# Patient Record
Sex: Female | Born: 1945 | Race: White | Hispanic: No | Marital: Married | Smoking: Never smoker
Health system: Southern US, Community
[De-identification: ages and names within clinical notes are randomized; demographics above are authoritative.]

## PROBLEM LIST (undated history)

## (undated) DIAGNOSIS — K5792 Diverticulitis of intestine, part unspecified, without perforation or abscess without bleeding: Secondary | ICD-10-CM

## (undated) DIAGNOSIS — G4700 Insomnia, unspecified: Secondary | ICD-10-CM

## (undated) DIAGNOSIS — Z8619 Personal history of other infectious and parasitic diseases: Secondary | ICD-10-CM

## (undated) DIAGNOSIS — N3010 Interstitial cystitis (chronic) without hematuria: Secondary | ICD-10-CM

## (undated) DIAGNOSIS — Z860100 Personal history of colon polyps, unspecified: Secondary | ICD-10-CM

## (undated) DIAGNOSIS — M8580 Other specified disorders of bone density and structure, unspecified site: Secondary | ICD-10-CM

## (undated) DIAGNOSIS — T7840XA Allergy, unspecified, initial encounter: Secondary | ICD-10-CM

## (undated) DIAGNOSIS — K2270 Barrett's esophagus without dysplasia: Secondary | ICD-10-CM

## (undated) DIAGNOSIS — I341 Nonrheumatic mitral (valve) prolapse: Secondary | ICD-10-CM

## (undated) DIAGNOSIS — M1990 Unspecified osteoarthritis, unspecified site: Secondary | ICD-10-CM

## (undated) DIAGNOSIS — K219 Gastro-esophageal reflux disease without esophagitis: Secondary | ICD-10-CM

## (undated) DIAGNOSIS — J309 Allergic rhinitis, unspecified: Secondary | ICD-10-CM

## (undated) DIAGNOSIS — E785 Hyperlipidemia, unspecified: Secondary | ICD-10-CM

## (undated) DIAGNOSIS — J988 Other specified respiratory disorders: Secondary | ICD-10-CM

## (undated) DIAGNOSIS — J45909 Unspecified asthma, uncomplicated: Secondary | ICD-10-CM

## (undated) DIAGNOSIS — Z8669 Personal history of other diseases of the nervous system and sense organs: Secondary | ICD-10-CM

## (undated) DIAGNOSIS — I4892 Unspecified atrial flutter: Secondary | ICD-10-CM

## (undated) DIAGNOSIS — K529 Noninfective gastroenteritis and colitis, unspecified: Secondary | ICD-10-CM

## (undated) DIAGNOSIS — H269 Unspecified cataract: Secondary | ICD-10-CM

## (undated) DIAGNOSIS — J40 Bronchitis, not specified as acute or chronic: Secondary | ICD-10-CM

## (undated) DIAGNOSIS — M419 Scoliosis, unspecified: Secondary | ICD-10-CM

## (undated) DIAGNOSIS — G4730 Sleep apnea, unspecified: Secondary | ICD-10-CM

## (undated) DIAGNOSIS — W57XXXA Bitten or stung by nonvenomous insect and other nonvenomous arthropods, initial encounter: Secondary | ICD-10-CM

## (undated) DIAGNOSIS — F419 Anxiety disorder, unspecified: Secondary | ICD-10-CM

## (undated) DIAGNOSIS — I4891 Unspecified atrial fibrillation: Secondary | ICD-10-CM

## (undated) HISTORY — DX: Gastro-esophageal reflux disease without esophagitis: K21.9

## (undated) HISTORY — DX: Diverticulitis of intestine, part unspecified, without perforation or abscess without bleeding: K57.92

## (undated) HISTORY — DX: Allergy, unspecified, initial encounter: T78.40XA

## (undated) HISTORY — DX: Personal history of colonic polyps: Z86.010

## (undated) HISTORY — DX: Personal history of other diseases of the nervous system and sense organs: Z86.69

## (undated) HISTORY — DX: Scoliosis, unspecified: M41.9

## (undated) HISTORY — DX: Other specified disorders of bone density and structure, unspecified site: M85.80

## (undated) HISTORY — DX: Bitten or stung by nonvenomous insect and other nonvenomous arthropods, initial encounter: W57.XXXA

## (undated) HISTORY — DX: Unspecified atrial fibrillation: I48.91

## (undated) HISTORY — DX: Allergic rhinitis, unspecified: J30.9

## (undated) HISTORY — PX: COLONOSCOPY: SHX174

## (undated) HISTORY — DX: Insomnia, unspecified: G47.00

## (undated) HISTORY — DX: Unspecified asthma, uncomplicated: J45.909

## (undated) HISTORY — DX: Unspecified cataract: H26.9

## (undated) HISTORY — DX: Nonrheumatic mitral (valve) prolapse: I34.1

## (undated) HISTORY — DX: Sleep apnea, unspecified: G47.30

## (undated) HISTORY — PX: OTHER SURGICAL HISTORY: SHX169

## (undated) HISTORY — PX: APPENDECTOMY: SHX54

## (undated) HISTORY — DX: Interstitial cystitis (chronic) without hematuria: N30.10

## (undated) HISTORY — DX: Barrett's esophagus without dysplasia: K22.70

## (undated) HISTORY — DX: Hyperlipidemia, unspecified: E78.5

## (undated) HISTORY — DX: Personal history of colon polyps, unspecified: Z86.0100

## (undated) HISTORY — DX: Anxiety disorder, unspecified: F41.9

## (undated) HISTORY — DX: Unspecified osteoarthritis, unspecified site: M19.90

## (undated) HISTORY — PX: CHOLECYSTECTOMY: SHX55

## (undated) HISTORY — DX: Unspecified atrial flutter: I48.92

## (undated) HISTORY — PX: WISDOM TOOTH EXTRACTION: SHX21

## (undated) HISTORY — PX: BREAST BIOPSY: SHX20

## (undated) HISTORY — DX: Other specified respiratory disorders: J98.8

## (undated) HISTORY — DX: Bronchitis, not specified as acute or chronic: J40

## (undated) HISTORY — DX: Personal history of other infectious and parasitic diseases: Z86.19

## (undated) HISTORY — PX: TONSILLECTOMY: SUR1361

## (undated) HISTORY — PX: ADENOIDECTOMY: SUR15

## (undated) HISTORY — PX: DILATION AND CURETTAGE OF UTERUS: SHX78

## (undated) HISTORY — PX: ABDOMINAL HYSTERECTOMY: SHX81

## (undated) HISTORY — DX: Noninfective gastroenteritis and colitis, unspecified: K52.9

---

## 1989-08-29 DIAGNOSIS — N3010 Interstitial cystitis (chronic) without hematuria: Secondary | ICD-10-CM

## 1989-08-29 HISTORY — DX: Interstitial cystitis (chronic) without hematuria: N30.10

## 1998-02-27 ENCOUNTER — Encounter: Admission: RE | Admit: 1998-02-27 | Discharge: 1998-05-28 | Payer: Self-pay | Admitting: Family Medicine

## 1998-04-17 ENCOUNTER — Encounter: Admission: RE | Admit: 1998-04-17 | Discharge: 1998-04-17 | Payer: Self-pay | Admitting: Sports Medicine

## 1998-05-15 ENCOUNTER — Encounter: Admission: RE | Admit: 1998-05-15 | Discharge: 1998-05-15 | Payer: Self-pay | Admitting: Family Medicine

## 1998-07-23 ENCOUNTER — Other Ambulatory Visit: Admission: RE | Admit: 1998-07-23 | Discharge: 1998-07-23 | Payer: Self-pay | Admitting: *Deleted

## 1999-10-01 ENCOUNTER — Encounter: Admission: RE | Admit: 1999-10-01 | Discharge: 1999-10-01 | Payer: Self-pay | Admitting: Sports Medicine

## 1999-11-24 ENCOUNTER — Encounter: Admission: RE | Admit: 1999-11-24 | Discharge: 1999-11-24 | Payer: Self-pay | Admitting: Sports Medicine

## 1999-11-24 ENCOUNTER — Encounter: Payer: Self-pay | Admitting: Sports Medicine

## 2000-07-14 ENCOUNTER — Encounter: Admission: RE | Admit: 2000-07-14 | Discharge: 2000-07-14 | Payer: Self-pay | Admitting: Family Medicine

## 2000-07-28 ENCOUNTER — Encounter (INDEPENDENT_AMBULATORY_CARE_PROVIDER_SITE_OTHER): Payer: Self-pay

## 2000-07-28 ENCOUNTER — Other Ambulatory Visit: Admission: RE | Admit: 2000-07-28 | Discharge: 2000-07-28 | Payer: Self-pay | Admitting: Internal Medicine

## 2000-09-08 ENCOUNTER — Encounter: Admission: RE | Admit: 2000-09-08 | Discharge: 2000-09-08 | Payer: Self-pay | Admitting: Sports Medicine

## 2001-06-02 ENCOUNTER — Encounter: Admission: RE | Admit: 2001-06-02 | Discharge: 2001-06-02 | Payer: Self-pay | Admitting: Sports Medicine

## 2001-10-19 ENCOUNTER — Encounter: Admission: RE | Admit: 2001-10-19 | Discharge: 2001-10-19 | Payer: Self-pay | Admitting: Sports Medicine

## 2001-11-14 ENCOUNTER — Encounter: Admission: RE | Admit: 2001-11-14 | Discharge: 2001-11-14 | Payer: Self-pay | Admitting: Sports Medicine

## 2001-12-22 ENCOUNTER — Other Ambulatory Visit: Admission: RE | Admit: 2001-12-22 | Discharge: 2001-12-22 | Payer: Self-pay | Admitting: *Deleted

## 2003-06-30 DIAGNOSIS — K529 Noninfective gastroenteritis and colitis, unspecified: Secondary | ICD-10-CM

## 2003-06-30 HISTORY — DX: Noninfective gastroenteritis and colitis, unspecified: K52.9

## 2003-11-30 DIAGNOSIS — M419 Scoliosis, unspecified: Secondary | ICD-10-CM

## 2003-11-30 HISTORY — DX: Scoliosis, unspecified: M41.9

## 2004-05-28 ENCOUNTER — Encounter: Admission: RE | Admit: 2004-05-28 | Discharge: 2004-05-28 | Payer: Self-pay | Admitting: Sports Medicine

## 2004-07-16 ENCOUNTER — Encounter: Admission: RE | Admit: 2004-07-16 | Discharge: 2004-07-16 | Payer: Self-pay | Admitting: Sports Medicine

## 2004-08-18 ENCOUNTER — Ambulatory Visit (HOSPITAL_COMMUNITY): Admission: RE | Admit: 2004-08-18 | Discharge: 2004-08-18 | Payer: Self-pay | Admitting: Sports Medicine

## 2005-01-15 ENCOUNTER — Ambulatory Visit: Payer: Self-pay | Admitting: Family Medicine

## 2005-01-15 ENCOUNTER — Encounter: Admission: RE | Admit: 2005-01-15 | Discharge: 2005-01-15 | Payer: Self-pay | Admitting: Sports Medicine

## 2005-01-25 ENCOUNTER — Ambulatory Visit: Payer: Self-pay | Admitting: Internal Medicine

## 2005-09-17 ENCOUNTER — Ambulatory Visit: Payer: Self-pay | Admitting: Sports Medicine

## 2005-10-19 ENCOUNTER — Ambulatory Visit: Payer: Self-pay | Admitting: Sports Medicine

## 2005-10-27 ENCOUNTER — Encounter: Admission: RE | Admit: 2005-10-27 | Discharge: 2005-10-27 | Payer: Self-pay | Admitting: Sports Medicine

## 2007-01-26 DIAGNOSIS — K2100 Gastro-esophageal reflux disease with esophagitis, without bleeding: Secondary | ICD-10-CM | POA: Insufficient documentation

## 2007-01-26 DIAGNOSIS — K589 Irritable bowel syndrome without diarrhea: Secondary | ICD-10-CM | POA: Insufficient documentation

## 2007-01-26 DIAGNOSIS — D126 Benign neoplasm of colon, unspecified: Secondary | ICD-10-CM | POA: Insufficient documentation

## 2007-01-26 DIAGNOSIS — M899 Disorder of bone, unspecified: Secondary | ICD-10-CM | POA: Insufficient documentation

## 2007-01-26 DIAGNOSIS — J309 Allergic rhinitis, unspecified: Secondary | ICD-10-CM | POA: Insufficient documentation

## 2007-01-26 DIAGNOSIS — K5730 Diverticulosis of large intestine without perforation or abscess without bleeding: Secondary | ICD-10-CM | POA: Insufficient documentation

## 2007-01-26 DIAGNOSIS — M949 Disorder of cartilage, unspecified: Secondary | ICD-10-CM

## 2007-10-11 ENCOUNTER — Telehealth: Payer: Self-pay | Admitting: *Deleted

## 2007-10-31 ENCOUNTER — Ambulatory Visit: Payer: Self-pay | Admitting: Sports Medicine

## 2007-10-31 DIAGNOSIS — M722 Plantar fascial fibromatosis: Secondary | ICD-10-CM | POA: Insufficient documentation

## 2008-02-29 ENCOUNTER — Ambulatory Visit (HOSPITAL_COMMUNITY): Admission: RE | Admit: 2008-02-29 | Discharge: 2008-02-29 | Payer: Self-pay | Admitting: Family Medicine

## 2008-02-29 ENCOUNTER — Ambulatory Visit: Payer: Self-pay | Admitting: Sports Medicine

## 2008-02-29 DIAGNOSIS — R0789 Other chest pain: Secondary | ICD-10-CM | POA: Insufficient documentation

## 2008-02-29 DIAGNOSIS — M4120 Other idiopathic scoliosis, site unspecified: Secondary | ICD-10-CM | POA: Insufficient documentation

## 2008-02-29 DIAGNOSIS — G540 Brachial plexus disorders: Secondary | ICD-10-CM | POA: Insufficient documentation

## 2008-02-29 LAB — CONVERTED CEMR LAB
ALT: 25 units/L (ref 0–35)
AST: 21 units/L (ref 0–37)
Albumin: 4.9 g/dL (ref 3.5–5.2)
Alkaline Phosphatase: 62 units/L (ref 39–117)
BUN: 18 mg/dL (ref 6–23)
CO2: 24 meq/L (ref 19–32)
Calcium: 9.2 mg/dL (ref 8.4–10.5)
Chloride: 103 meq/L (ref 96–112)
Cholesterol: 189 mg/dL (ref 0–200)
Creatinine, Ser: 0.67 mg/dL (ref 0.40–1.20)
Glucose, Bld: 93 mg/dL (ref 70–99)
HCT: 42.1 % (ref 36.0–46.0)
HDL: 55 mg/dL (ref >39)
Hemoglobin: 14 g/dL (ref 12.0–15.0)
LDL Cholesterol: 121 mg/dL — ABNORMAL HIGH (ref 0–99)
MCHC: 33.3 g/dL (ref 30.0–36.0)
MCV: 92.3 fL (ref 78.0–100.0)
Platelets: 251 10*3/uL (ref 150–400)
Potassium: 4.3 meq/L (ref 3.5–5.3)
RBC: 4.56 M/uL (ref 3.87–5.11)
RDW: 13.4 % (ref 11.5–15.5)
Sodium: 142 meq/L (ref 135–145)
Total Bilirubin: 0.7 mg/dL (ref 0.3–1.2)
Total CHOL/HDL Ratio: 3.4
Total Protein: 6.8 g/dL (ref 6.0–8.3)
Triglycerides: 67 mg/dL (ref <150)
VLDL: 13 mg/dL (ref 0–40)
WBC: 4.6 10*3/uL (ref 4.0–10.5)

## 2008-03-04 ENCOUNTER — Telehealth: Payer: Self-pay | Admitting: *Deleted

## 2008-03-07 ENCOUNTER — Encounter: Payer: Self-pay | Admitting: Sports Medicine

## 2008-04-16 ENCOUNTER — Encounter (INDEPENDENT_AMBULATORY_CARE_PROVIDER_SITE_OTHER): Payer: Self-pay | Admitting: *Deleted

## 2008-05-03 ENCOUNTER — Ambulatory Visit (HOSPITAL_COMMUNITY): Admission: RE | Admit: 2008-05-03 | Discharge: 2008-05-03 | Payer: Self-pay | Admitting: Sports Medicine

## 2008-05-20 ENCOUNTER — Telehealth: Payer: Self-pay | Admitting: *Deleted

## 2008-05-22 ENCOUNTER — Encounter: Payer: Self-pay | Admitting: Sports Medicine

## 2008-06-18 ENCOUNTER — Ambulatory Visit: Payer: Self-pay | Admitting: Internal Medicine

## 2008-06-27 ENCOUNTER — Ambulatory Visit: Payer: Self-pay | Admitting: Internal Medicine

## 2008-06-27 ENCOUNTER — Encounter: Payer: Self-pay | Admitting: Internal Medicine

## 2008-06-28 ENCOUNTER — Encounter: Payer: Self-pay | Admitting: Internal Medicine

## 2008-11-11 ENCOUNTER — Other Ambulatory Visit: Admission: RE | Admit: 2008-11-11 | Discharge: 2008-11-11 | Payer: Self-pay | Admitting: Obstetrics & Gynecology

## 2009-03-12 ENCOUNTER — Telehealth: Payer: Self-pay | Admitting: Internal Medicine

## 2009-03-12 ENCOUNTER — Ambulatory Visit: Payer: Self-pay | Admitting: Internal Medicine

## 2009-03-12 DIAGNOSIS — K5100 Ulcerative (chronic) pancolitis without complications: Secondary | ICD-10-CM | POA: Insufficient documentation

## 2009-03-12 DIAGNOSIS — Z860100 Personal history of colon polyps, unspecified: Secondary | ICD-10-CM | POA: Insufficient documentation

## 2009-03-13 LAB — CONVERTED CEMR LAB
Basophils Relative: 0.3 % (ref 0.0–3.0)
Eosinophils Relative: 1.9 % (ref 0.0–5.0)
HCT: 38.1 % (ref 36.0–46.0)
Hemoglobin: 13.6 g/dL (ref 12.0–15.0)
Lymphocytes Relative: 31.9 % (ref 12.0–46.0)
MCHC: 35.7 g/dL (ref 30.0–36.0)
MCV: 90.8 fL (ref 78.0–100.0)
Monocytes Relative: 5.8 % (ref 3.0–12.0)
Neutrophils Relative %: 60.4 % (ref 43.0–77.0)
Platelets: 233 10*3/uL (ref 150.0–400.0)
RBC: 4.2 M/uL (ref 3.87–5.11)
RDW: 13 % (ref 11.5–14.6)
WBC: 8.6 10*3/uL (ref 4.5–10.5)

## 2009-03-17 ENCOUNTER — Encounter: Payer: Self-pay | Admitting: Internal Medicine

## 2010-02-23 ENCOUNTER — Telehealth: Payer: Self-pay | Admitting: Internal Medicine

## 2010-02-24 ENCOUNTER — Ambulatory Visit: Payer: Self-pay | Admitting: Gastroenterology

## 2010-02-24 DIAGNOSIS — R1031 Right lower quadrant pain: Secondary | ICD-10-CM | POA: Insufficient documentation

## 2010-02-24 DIAGNOSIS — K625 Hemorrhage of anus and rectum: Secondary | ICD-10-CM | POA: Insufficient documentation

## 2010-02-25 LAB — CONVERTED CEMR LAB
ALT: 27 units/L (ref 0–35)
AST: 27 units/L (ref 0–37)
Basophils Absolute: 0 10*3/uL (ref 0.0–0.1)
Chloride: 104 meq/L (ref 96–112)
Creatinine, Ser: 0.7 mg/dL (ref 0.4–1.2)
Eosinophils Absolute: 0 10*3/uL (ref 0.0–0.7)
Hemoglobin: 13.3 g/dL (ref 12.0–15.0)
Lymphocytes Relative: 31.8 % (ref 12.0–46.0)
Lymphs Abs: 1.4 10*3/uL (ref 0.7–4.0)
MCHC: 33.6 g/dL (ref 30.0–36.0)
Neutro Abs: 2.6 10*3/uL (ref 1.4–7.7)
Platelets: 225 10*3/uL (ref 150.0–400.0)
RDW: 12.8 % (ref 11.5–14.6)
Sodium: 142 meq/L (ref 135–145)
Total Bilirubin: 0.8 mg/dL (ref 0.3–1.2)

## 2010-03-31 ENCOUNTER — Ambulatory Visit: Payer: Self-pay | Admitting: Internal Medicine

## 2010-09-01 ENCOUNTER — Encounter: Payer: Self-pay | Admitting: Family Medicine

## 2010-09-01 DIAGNOSIS — J45909 Unspecified asthma, uncomplicated: Secondary | ICD-10-CM | POA: Insufficient documentation

## 2010-10-06 ENCOUNTER — Telehealth (INDEPENDENT_AMBULATORY_CARE_PROVIDER_SITE_OTHER): Payer: Self-pay | Admitting: *Deleted

## 2010-10-06 ENCOUNTER — Telehealth: Payer: Self-pay | Admitting: Internal Medicine

## 2010-10-07 ENCOUNTER — Ambulatory Visit: Payer: Self-pay | Admitting: Nurse Practitioner

## 2010-10-21 ENCOUNTER — Ambulatory Visit: Payer: Self-pay | Admitting: Internal Medicine

## 2010-10-28 ENCOUNTER — Telehealth: Payer: Self-pay | Admitting: Internal Medicine

## 2010-10-29 ENCOUNTER — Telehealth (INDEPENDENT_AMBULATORY_CARE_PROVIDER_SITE_OTHER): Payer: Self-pay | Admitting: *Deleted

## 2010-11-03 ENCOUNTER — Telehealth (INDEPENDENT_AMBULATORY_CARE_PROVIDER_SITE_OTHER): Payer: Self-pay | Admitting: *Deleted

## 2010-12-03 ENCOUNTER — Telehealth (INDEPENDENT_AMBULATORY_CARE_PROVIDER_SITE_OTHER): Payer: Self-pay | Admitting: *Deleted

## 2010-12-07 ENCOUNTER — Ambulatory Visit
Admission: RE | Admit: 2010-12-07 | Discharge: 2010-12-07 | Payer: Self-pay | Source: Home / Self Care | Attending: Internal Medicine | Admitting: Internal Medicine

## 2010-12-07 ENCOUNTER — Encounter: Payer: Self-pay | Admitting: Internal Medicine

## 2010-12-08 ENCOUNTER — Ambulatory Visit
Admission: RE | Admit: 2010-12-08 | Discharge: 2010-12-08 | Payer: Self-pay | Source: Home / Self Care | Attending: Internal Medicine | Admitting: Internal Medicine

## 2010-12-08 ENCOUNTER — Encounter: Payer: Self-pay | Admitting: Internal Medicine

## 2010-12-15 ENCOUNTER — Encounter: Payer: Self-pay | Admitting: Internal Medicine
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
## 2010-12-29 NOTE — Progress Notes (Signed)
Summary: Triage  Phone Note Call from Patient Call back at Home Phone [phone]   Caller: Patient Call For: Dr. Juanda Chance Reason for Call: Talk to Nurse Details for Reason: Triage Summary of Call: Pt. scheduled an appt. to see Dr. Juanda Chance yesterday for 12/7. However, pt. is experiencing a lot of blood in her stool with stomach discomfort and pain. Feels she needs to be seen sooner. Please call. Initial call taken by: Schuyler Amor,  October 06, 2010 10:07 AM  Follow-up for Phone Call        Patient called to report continued bright red blood with her stools. She reports she is using the suppositories and this helps but the blood does not totally go away. Also, reports continued LLQ and back pain that is worse at night. Reports that the pain may go away for days then return. She would like to be seen before the 11/04/10 appointment. Scheduled her with Willette Cluster, RNP on 10/08/10 at 9:30.  Follow-up by: Jesse Fall RN,  October 06, 2010 11:31 AM  Additional Follow-up for Phone Call Additional follow up Details #1::        please start Asacal 400mg , 3 by mouth two times a day, #180, Bentyl 20mg  by mouth two times a day. Labs CBC,sed.rate. OK to see Gunnar Fusi. I have tried to rech pt at home as well as at work but was unable to speak to her. Additional Follow-up by: Hart Carwin MD,  October 06, 2010 1:21 PM    Additional Follow-up for Phone Call Additional follow up Details #2::    Message left for patient to call back at patient's home number and work number. Jesse Fall RN  October 06, 2010 2:16 PM  New/Updated Medications: ASACOL 400 MG TBEC (MESALAMINE) Take 3 tablets by mouth two times a day BENTYL 20 MG TABS (DICYCLOMINE HCL) take one by mouth Prescriptions: BENTYL 20 MG TABS (DICYCLOMINE HCL) take one by mouth  #60 x 0   Entered by:   Jesse Fall RN   Authorized by:   Hart Carwin MD   Signed by:   Jesse Fall RN on 10/06/2010   Method used:   Electronically to       [facility] [address] [phone]* (retail)       [address]       [place], [place]  [address]       Ph: [phone] or [phone]       Fax: [phone]   RxID:   [id number] ASACOL 400 MG TBEC (MESALAMINE) Take 3 tablets by mouth two times a day  #180 x 0   Entered by:   Jesse Fall RN   Authorized by:   Hart Carwin MD   Signed by:   Jesse Fall RN on 10/06/2010   Method used:   Electronically to        [facility]. [address] [phone]* (retail)       [address]       [place], [place]  [address]       Ph: [phone] or [phone]       Fax: [phone]   RxID:   [id number]

## 2010-12-29 NOTE — Miscellaneous (Signed)
Summary: asthma dx update  Clinical Lists Changes  Problems: Added new problem of ASTHMA, INTERMITTENT (ICD-493.90) Removed problem of ASTHMA, UNSPECIFIED (ICD-493.90)

## 2010-12-29 NOTE — Progress Notes (Signed)
  Phone Note Outgoing Call   Call placed by: Jesse Fall, RN Call placed to: Patient Summary of Call: Called patient at home number and left message for her to call us with an update on how she is feeling. Jesse Fall RN  November 03, 2010 10:43 AM  Follow-up for Phone Call        Patient states her headaches are much better since she decreased the Asacol. She now thinks the headaches were related to seasonal sinus problems. Patient states she has some abdominal cramping since she increased to medication dose. She also states she continues to have bright red blood in stool. The amount of blood in stool has not changed but it is still there. Please, advise. Follow-up by: Jesse Fall RN,  November 04, 2010 9:09 AM  Additional Follow-up for Phone Call Additional follow up Details #1::        please start on prednisone 20 mg by mouth once daily x 1 week, 15 mg by mouth once daily x 1 week, 10 mg poqd x 1 week, 5 mg by mouth once daily x 1 week, give Prednisone 10mg , #60, take as directed. Call us with a follow up. Additional Follow-up by: Hart Carwin MD,  November 04, 2010 9:30 AM    Additional Follow-up for Phone Call Additional follow up Details #2::    Patient notified of Dr. Regino Schultze recommendations. Rx sent to pharmacy. Follow-up by: Jesse Fall RN,  November 04, 2010 10:19 AM  New/Updated Medications: PREDNISONE 10 MG TABS (PREDNISONE) Take 2 tab by mouth daily x 7 days,then take 1.5 tab by mouth x 7 days, take 1 tab by mouth x 7 days then 1/2 tab by mouth x 7 days. Prescriptions: PREDNISONE 10 MG TABS (PREDNISONE) Take 2 tab by mouth daily x 7 days,then take 1.5 tab by mouth x 7 days, take 1 tab by mouth x 7 days then 1/2 tab by mouth x 7 days.  #60 x 0   Entered by:   Jesse Fall RN   Authorized by:   Hart Carwin MD   Signed by:   Jesse Fall RN on 11/04/2010   Method used:   Electronically to        [facility]. [address] [phone]* (retail)       [address]      [place], [place]  [address]       Ph: [phone] or [phone]       Fax: [phone]   RxID:   [id number]

## 2010-12-29 NOTE — Progress Notes (Signed)
Summary: Triage  Phone Note Call from Patient Call back at Home Phone [phone]   Caller: Patient Call For: Dr. Juanda Chance Reason for Call: Talk to Nurse Summary of Call: reporting lower back pain mostly on right side... sometimes abd pain... pt doesnt even know if this is GI related or if this is from a pinched nerve... pt says she has had blood in her stool, but this has improved Initial call taken by: Vallarie Mare,  February 23, 2010 1:30 PM  Follow-up for Phone Call        Pt. has had increased rectal bleeding for 2 monthes, intermittent episodes. Also increased mucus. Intermittent episodes of diarrhea.  She started Canasa supp. 6 nights, some improvement. Uses Bentyl as needed. Lower back ache for 1 week. Would like to be seen before Thursday, she is going out of town. Pt. will see Willette Cluster NP on 02-24-10 at 10am. Follow-up by: Laureen Ochs LPN,  February 23, 2010 2:34 PM

## 2010-12-29 NOTE — Progress Notes (Signed)
Summary: Asacol Side Effects  Phone Note From Other Clinic   Caller: Patient Summary of Call: Patient states that she began doubling up on her Asacol 400 mg to 6 tablets two times a day (as Dr Juanda Chance suggested in her office note on 10/21/10 that she wanted her to increase from Asacol 2.4 mg daily to Asacol 4.8 mg daily). Since doubling up on medications, she has began having terrible headaches as well as generalized abdominal pain, but no other associated symptoms. She did not take her medication yesterday or today due to the side effects and would like to know suggestions as to what to do next. Initial call taken by: Lamona Curl CMA Duncan Dull),  October 28, 2010 4:06 PM  Follow-up for Phone Call        not sure if her symptoms are actually related to increased dose of Asacol (I doubt). I don't think we need to do anything different tonight. Check with Dr. Juanda Chance in the a.m. Follow-up by: Hilarie Fredrickson MD,  October 28, 2010 4:32 PM  Additional Follow-up for Phone Call Additional follow up Details #1::        Dr Gaynelle Arabian? Thanx. Please reduce dose of Asacal to 3.2gm/day ( 3--2-  3). and call us back, if headache continues, go back to 2.4 gm/day ( 2-2-) Additional Follow-up by: Hart Carwin MD,  October 28, 2010 10:23 PM    Additional Follow-up for Phone Call Additional follow up Details #2::    Message left for patient to call back at work number Follow-up by: Jesse Fall RN,  October 29, 2010 9:57 AM  Additional Follow-up for Phone Call Additional follow up Details #3:: Details for Additional Follow-up Action Taken: Spoke with patient. She will take Asacol 3.2 Gram/day (3-2-3) and call us back with update. Instructed patient not to pick up the rx for 800 mg tablets. Rx for 400 mg sent to pharmacy. Additional Follow-up by: Jesse Fall RN,  October 29, 2010 1:10 PM  New/Updated Medications: ASACOL 400 MG TBEC (MESALAMINE) Take 3 tablets in AM, 2 tablets Midday and 3  tablets at night. Prescriptions: ASACOL 400 MG TBEC (MESALAMINE) Take 3 tablets in AM, 2 tablets Midday and 3 tablets at night.  #240 x 0   Entered by:   Jesse Fall RN   Authorized by:   Hart Carwin MD   Signed by:   Jesse Fall RN on 10/29/2010   Method used:   Electronically to        [facility]. [address] [phone]* (retail)       [address]       [place], [place]  [address]       Ph: [phone] or [phone]       Fax: [phone]   RxID:   [id number]

## 2010-12-29 NOTE — Progress Notes (Signed)
----   Converted from flag ---- ---- 10/29/2010 9:12 AM, Karna Christmas wrote: pt. returned your call, call her today at:  # [phone] ------------------------------ Called patient.

## 2010-12-29 NOTE — Progress Notes (Signed)
----   Converted from flag ---- ---- 10/06/2010 1:34 PM, Hart Carwin MD wrote: Rene Kocher, I just got hold of Mrs Lyn concerning her colitis. I suspect  her colitis  is more extensive than just proctitis. Please send her Asacal 400mg . #180, 3 by mouth two times a day. And she needs a refill on Bentyl 20mg  by mouth two times a day. Please cancel appointm with Gunnar Fusi amd try to fit her in with me the week after my hospital week. Thanx ------------------------------   Dr. Juanda Chance, You are booked on 10/19/10, 10/21/10 is a short day before Thanksgiving and it is booked also. 10/27/10 is also booked up. The next available you have is 10/29/10. Please, advise. Cinsere Mizrahi.  Patient scheduled for 10/21/10 at 10:00 AM. Left a message for patient at her home number.

## 2010-12-31 NOTE — Letter (Signed)
Summary: [facility]  [address] [place], [place] [address]   Phone: [phone]  Fax: [phone]       BRAYLEE LAL    December 08, 1945    MRN: [id number]        Procedure Day /Date: Tuesday 12/08/10     Arrival Time: 2:30 pm     Procedure Time: 3:30 pm     Location of Procedure:                    _ x_  [facility] (4th Floor)  PREPARATION FOR FLEXIBLE SIGMOIDOSCOPY WITH MAGNESIUM CITRATE  Prior to the day before your procedure, purchase one 8 oz. bottle of Magnesium Citrate and one Fleet Enema from the laxative section of your drugstore.  _________________________________________________________________________________________________  THE DAY BEFORE YOUR PROCEDURE             DATE: 12/07/10      DAY: Monday  1.   Have a clear liquid dinner the night before your procedure.  2.   Do not drink anything colored red or purple.  Avoid juices with pulp.  No orange juice.              CLEAR LIQUIDS INCLUDE: Water Jello Ice Popsicles Tea (sugar ok, no milk/cream) Powdered fruit flavored drinks Coffee (sugar ok, no milk/cream) Gatorade Juice: apple, white grape, white cranberry  Lemonade Clear bullion, consomm, broth Carbonated beverages (any kind) Strained chicken noodle soup Hard Candy   3.   At 7:00 pm the night before your procedure, drink one bottle of Magnesium Citrate over ice.  4.   Drink at least 3 more glasses of clear liquids before bedtime (preferably juices).  5.   Results are expected usually within 1 to 6 hours after taking the Magnesium Citrate.  ___________________________________________________________________________________________________  THE DAY OF YOUR PROCEDURE            DATE: 12/08/10     DAY: Tuesday  1.   Use Fleet Enema one hour prior to coming for procedure.  2.   You may drink clear liquids until 1:30 pm (2 hours before exam)       MEDICATION INSTRUCTIONS  Unless otherwise  instructed, you should take regular prescription medications with a small sip of water as early as possible the morning of your procedure.        OTHER INSTRUCTIONS  You will need a responsible adult at least 64 years of age to accompany you and drive you home.   This person must remain in the waiting room during your procedure.  Wear loose fitting clothing that is easily removed.  Leave jewelry and other valuables at home.  However, you may wish to bring a book to read or an iPod/MP3 player to listen to music as you wait for your procedure to start.  Remove all body piercing jewelry and leave at home.  Total time from sign-in until discharge is approximately 2-3 hours.  You should go home directly after your procedure and rest.  You can resume normal activities the day after your procedure.  The day of your procedure you should not:   Drive   Make legal decisions   Operate machinery   Drink alcohol   Return to work  You will receive specific instructions about eating, activities and medications before you leave.   The above instructions have been reviewed and explained to me by   _______________________    I fully understand  and can verbalize these instructions _____________________________ Date _________
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
## 2010-12-31 NOTE — Procedures (Signed)
Summary: Flexible Sigmoidoscopy  Patient: Leslie Reed Note: All result statuses are Final unless otherwise noted.  Tests: (1) Flexible Sigmoidoscopy (FLX)  FLX Flexible Sigmoidoscopy                             DONE     [facility]     [address]     [place], [place]  [address]           FLEXIBLE SIGMOIDOSCOPY PROCEDURE REPORT           PATIENT:  [name], [name]  MR#:  [id number]     BIRTHDATE:  03-01-1946, 64 yrs. old  GENDER:  female           ENDOSCOPIST:  Hedwig Morton. Juanda Chance, MD     Referred by:           PROCEDURE DATE:  12/08/2010     PROCEDURE:  Flexible Sigmoidoscopy with biopsy     ASA CLASS:  Class I     INDICATIONS:  hematochezia hx of U proctitis 2004, diverticulosis     2009     hx of adenomatous polyp in 2001           MEDICATIONS:   Versed 7 mg, Fentanyl 100 mcg           DESCRIPTION OF PROCEDURE:   After the risks benefits and     alternatives of the procedure were thoroughly explained, informed     consent was obtained.  No rectal exam performed. The [id number]     [id number] endoscope was introduced through the anus and advanced to     the descending colon, without limitations.  The quality of the     prep was excellent.  The instrument was then slowly withdrawn as     the mucosa was fully examined.     <<PROCEDUREIMAGES>>           A pedunculated polyp was found in the sigmoid colon. at 15 cm 2 cm     pedunculated polyp, friable surface Polyp was snared, then     cauterized with monopolar cautery. Polyp was retrieved and sent to     pathology (see image3 and image4).  Mild diverticulosis was found     in the sigmoid colon.  The examination was otherwise normal. no     evidence of proctitis or colitis Random biopsies were obtained and     sent to pathology (see image2 and image5). Bx;s 25-70cm, and     rectum   Retroflexed views in the rectum revealed no     abnormalities.    The scope was then withdrawn from the patient     and the procedure  terminated.           COMPLICATIONS:  None           ENDOSCOPIC IMPRESSION:     1) Pedunculated polyp in the sigmoid colon     2) Mild diverticulosis in the sigmoid colon     3) Otherwise normal examination.     no evidence of active proctitis/ colitis, bleeding likely due to     a large polyp     RECOMMENDATIONS:     1) await biopsy results     stop all meds for colitis,           REPEAT EXAM:  In 5 year(s) for.  ______________________________     Hedwig Morton. Juanda Chance, MD           CC:           n.     eSIGNED:   Hedwig Morton. Hollyann Pablo at 12/08/2010 04:43 PM           Lorie Phenix, [id number]  Note: An exclamation mark (!) indicates a result that was not dispersed into the flowsheet. Document Creation Date: 12/08/2010 4:43 PM _______________________________________________________________________  (1) Order result status: Final Collection or observation date-time: 12/08/2010 16:34 Requested date-time:  Receipt date-time:  Reported date-time:  Referring Physician:   Ordering Physician: Lina Sar [phone]) Specimen Source:  Source: Launa Grill Order Number: [id number] Lab site:   Appended Document: Flexible Sigmoidoscopy     Procedures Next Due Date:    Flexible Sigmoidoscopy: 11/2015

## 2010-12-31 NOTE — Letter (Signed)
Summary: Patient Notice- Polyp Results  [facility]  [address] [place], [place] [address]   Phone: [phone]  Fax: [phone]        December 15, 2010 MRN: [id number]    Leslie Reed [address] [place], [place]  [address]    Dear Ms. Hlavaty,  I am pleased to inform you that the colon polyp(s) removed during your recent colonoscopy was (were) found to be benign (no cancer detected) upon pathologic examination.The polyp was tubovillous ( precancerous)  I recommend you have a repeat colonoscopy examination in 5_ years to look for recurrent polyps, as having colon polyps increases your risk for having recurrent polyps or even colon cancer in the future.  Should you develop new or worsening symptoms of abdominal pain, bowel habit changes or bleeding from the rectum or bowels, please schedule an evaluation with either your primary care physician or with me.  Additional information/recommendations:  _x_ No further action with gastroenterology is needed at this time. Please      follow-up with your primary care physician for your other healthcare      needs.  __ Please call [phone] to schedule a return visit to review your      situation.  __ Please keep your follow-up visit as already scheduled.  __ Continue treatment plan as outlined the day of your exam.  Please call us if you are having persistent problems or have questions about your condition that have not been fully answered at this time.  Sincerely,  Hart Carwin MD  This letter has been electronically signed by your physician.  Appended Document: Patient Notice- Polyp Results Letter mailed

## 2010-12-31 NOTE — Progress Notes (Signed)
  Phone Note Outgoing Call   Call placed by: Jesse Fall, RN Call placed to: Patient Summary of Call: Called patient to see how she is doing with the bleeding and cramping. She states she has noticed a decrease in the bleeding the last two days but she did have a significant amount of bleeding on New Year's Day. She states she continues to have cramping and it is worse than previous because it is occurring more often now. She completed her Prednisone yesterday. She is currently taking Asacol 6-8 tabs/day and Bentyl 2 tabs/day. She states she has been much more "faithful" to taking her medications. She is getting ready to go out of town to care for her father and wanted to r/s her visit for 12/21/10. She wanted to schedule prior to leaving on her trip. Scheduled patient for 12/07/10 at 2:15 PM.  Initial call taken by: Jesse Fall RN,  December 03, 2010 10:36 AM  Follow-up for Phone Call        OK Follow-up by: Hart Carwin MD,  December 03, 2010 1:36 PM

## 2011-06-11 ENCOUNTER — Telehealth: Payer: Self-pay | Admitting: Internal Medicine

## 2011-06-11 NOTE — Telephone Encounter (Signed)
Last flex sigm did not show any proctitis.Stop Canasa supp., wait for orthopedic eval..I will see her if problems continue

## 2011-06-11 NOTE — Telephone Encounter (Signed)
Left a message with patient's family for patient to call me.

## 2011-06-11 NOTE — Telephone Encounter (Signed)
Patient given Dr. Brodie's recommendations. 

## 2011-06-11 NOTE — Telephone Encounter (Signed)
Patient calling to report left lower back pain that radiates the abdomen for 5- 6 weeks that only occurs for the first 1-1 1/2 hours when she first gets up in the AM. She states she started Canasa suppositories 3 weeks ago and this has not helped.( She states she wasn't sure if the pain was orthopedic or GI.)   Denies bleeding, diarrhea or constipation. States her stools are soft but she has noticed a worse odor to stools. States she does feel bloated in the AM but after being up for a little bit it goes away just like the pain does. She reports she has been doing yard work last week. She has an appointment with her ortho MD on next Thursday. Discussed with patient that it sounds like an orthopedic problems since it only occurs when she had been supine for several hours and that it goes away when she gets up and moves. She wanted Dr. Regino Schultze imput on this and if she should continue on Canasa. 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 Please, advise.

## 2011-06-17 ENCOUNTER — Encounter: Payer: Self-pay | Admitting: Sports Medicine

## 2011-06-17 ENCOUNTER — Ambulatory Visit (INDEPENDENT_AMBULATORY_CARE_PROVIDER_SITE_OTHER): Payer: BC Managed Care – PPO | Admitting: Sports Medicine

## 2011-06-17 VITALS — BP 127/80 | HR 85 | Ht 65.0 in | Wt 134.0 lb

## 2011-06-17 DIAGNOSIS — M545 Low back pain, unspecified: Secondary | ICD-10-CM

## 2011-06-17 MED ORDER — CLINDAMYCIN PHOSPHATE 1 % EX GEL: Freq: Two times a day (BID) | CUTANEOUS | Status: DC

## 2011-06-17 NOTE — Progress Notes (Signed)
Subjective:    Patient ID: Leslie Reed, female    DOB: 04-Jan-1946, 65 y.o.   MRN: [id number]  HPI  Ms. Giannattasio is a pleasant 66 years old female patient comes in today for evaluation of low back pain. She has had low back pain for the last 6-7 weeks. He is really worse in the morning, located on her low back, in radiated to her flanks and lower pelvic area bilateral. The pain lasts for 1 or 2 hours after she gets out of bed in the morning and I'm become active and it does not bother her during the rest of the day. The pain does not radiate to her lower extremities, no numbness, no tingling, no saddle anesthesia, no weakness no urinary or  fecal incontinence. She has had issues with her low back in the past and also with her right SI joint. In the last 2 days after she keeps heavy work her symptoms have improved tremendously but she would like to be evaluated because she is planning a trip to [place] to visit her father . She denies any fever, chills, abdominal pain, hematuria.  She had a rectal sigmoidoscopy on January 2012 for a removal of a bleeding colon polyp. She did not know if these low back pain and was referred from her colon.  No past medical history on file Colon polyp removal x 2   No current outpatient prescriptions on file prior to visit.   Allergies  Allergen Reactions  . Pramoxine Hcl     REACTION: irritation on rectal area    History   Social History  . Marital Status: Married    Spouse Name: N/A    Number of Children: N/A  . Years of Education: N/A   Occupational History  . Not on file.   Social History Main Topics  . Smoking status: Never Smoker   . Smokeless tobacco: Never Used  . Alcohol Use: Not on file  . Drug Use: Not on file  . Sexually Active: Not on file   Other Topics Concern  . Not on file   Social History Narrative  . No narrative on file     Review of Systems  Constitutional: Negative for fever, chills and fatigue.    Gastrointestinal: Negative for abdominal pain, blood in stool and abdominal distention.  Musculoskeletal:       Low back pain with history of present illness       Objective:   Physical Exam  Constitutional: She appears well-developed and well-nourished.       BP 127/80  Pulse 85  Ht 5\' 5"  (1.651 m)  Wt 134 lb (60.782 kg)  BMI 22.30 kg/m2   Abdominal: Soft. Bowel sounds are normal. She exhibits no distension and no mass. There is no tenderness. There is no rebound and no guarding.  Musculoskeletal:       Low back with intact skin. Tenderness to palpation on the right SI joint. Full range of motion for flexion, extension, right and left lateral motion, without pain. Strength 5 over 5 for hip flexion and extension, Knee flexion and extension, ankle dorsal and plantar flexion Sensation is intact. Straight leg raise test negative B/L DTR  patellar and achilles I/II  B/L   Neurological: She is alert.  Skin: No erythema.  Psychiatric: She has a normal mood and affect.        Assessment & Plan:    1. Low back pain   This is most likely a myofascial  back pain. Her symptoms are already improving probably after her heavy work which he did a lot of overhead around kind of stretching of her low back structures. We will recommend 2 to low back exercises, a handout was giving and explained to the patient regarding the low back exercises. She also needs Cleocin refill for her axillary dermatitis.

## 2011-06-21 DIAGNOSIS — M545 Low back pain, unspecified: Secondary | ICD-10-CM | POA: Insufficient documentation

## 2011-06-21 NOTE — Assessment & Plan Note (Signed)
Please see documented plan  working on exercise rehab program

## 2011-10-13 ENCOUNTER — Encounter: Payer: Self-pay | Admitting: Internal Medicine

## 2011-10-13 ENCOUNTER — Ambulatory Visit (INDEPENDENT_AMBULATORY_CARE_PROVIDER_SITE_OTHER): Payer: PRIVATE HEALTH INSURANCE | Admitting: Internal Medicine

## 2011-10-13 VITALS — BP 120/80 | HR 60 | Ht 64.5 in | Wt 135.0 lb

## 2011-10-13 DIAGNOSIS — Z8669 Personal history of other diseases of the nervous system and sense organs: Secondary | ICD-10-CM | POA: Insufficient documentation

## 2011-10-13 DIAGNOSIS — R413 Other amnesia: Secondary | ICD-10-CM

## 2011-10-13 DIAGNOSIS — G479 Sleep disorder, unspecified: Secondary | ICD-10-CM

## 2011-10-13 DIAGNOSIS — M545 Low back pain, unspecified: Secondary | ICD-10-CM

## 2011-10-13 DIAGNOSIS — K625 Hemorrhage of anus and rectum: Secondary | ICD-10-CM

## 2011-10-13 DIAGNOSIS — G43909 Migraine, unspecified, not intractable, without status migrainosus: Secondary | ICD-10-CM

## 2011-10-13 DIAGNOSIS — K5732 Diverticulitis of large intestine without perforation or abscess without bleeding: Secondary | ICD-10-CM

## 2011-10-13 DIAGNOSIS — Z87898 Personal history of other specified conditions: Secondary | ICD-10-CM

## 2011-10-13 DIAGNOSIS — Z87448 Personal history of other diseases of urinary system: Secondary | ICD-10-CM

## 2011-10-13 DIAGNOSIS — Z818 Family history of other mental and behavioral disorders: Secondary | ICD-10-CM

## 2011-10-13 DIAGNOSIS — K5792 Diverticulitis of intestine, part unspecified, without perforation or abscess without bleeding: Secondary | ICD-10-CM | POA: Insufficient documentation

## 2011-10-13 DIAGNOSIS — Z820 Family history of epilepsy and other diseases of the nervous system: Secondary | ICD-10-CM

## 2011-10-13 DIAGNOSIS — J309 Allergic rhinitis, unspecified: Secondary | ICD-10-CM

## 2011-10-13 LAB — POCT URINALYSIS DIPSTICK
Bilirubin, UA: NEGATIVE
Glucose, UA: NEGATIVE
Ketones, UA: NEGATIVE
Spec Grav, UA: 1.02

## 2011-10-13 LAB — CBC WITH DIFFERENTIAL/PLATELET
Basophils Absolute: 0 10*3/uL (ref 0.0–0.1)
Eosinophils Absolute: 0 10*3/uL (ref 0.0–0.7)
HCT: 41.4 % (ref 36.0–46.0)
Hemoglobin: 13.9 g/dL (ref 12.0–15.0)
Lymphocytes Relative: 21.6 % (ref 12.0–46.0)
Lymphs Abs: 1.1 10*3/uL (ref 0.7–4.0)
MCHC: 33.6 g/dL (ref 30.0–36.0)
Neutro Abs: 3.6 10*3/uL (ref 1.4–7.7)
RDW: 13.6 % (ref 11.5–14.6)

## 2011-10-13 LAB — LIPID PANEL
Cholesterol: 202 mg/dL — ABNORMAL HIGH (ref 0–200)
HDL: 53.7 mg/dL (ref >39.00)
Triglycerides: 104 mg/dL (ref 0.0–149.0)
VLDL: 20.8 mg/dL (ref 0.0–40.0)

## 2011-10-13 LAB — BASIC METABOLIC PANEL
CO2: 27 mEq/L (ref 19–32)
Calcium: 9.3 mg/dL (ref 8.4–10.5)
Glucose, Bld: 83 mg/dL (ref 70–99)
Sodium: 142 mEq/L (ref 135–145)

## 2011-10-13 LAB — IBC PANEL
Saturation Ratios: 22.4 % (ref 20.0–50.0)
Transferrin: 258.1 mg/dL (ref 212.0–360.0)

## 2011-10-13 LAB — FERRITIN: Ferritin: 109.6 ng/mL (ref 10.0–291.0)

## 2011-10-13 LAB — HEPATIC FUNCTION PANEL: Albumin: 4.4 g/dL (ref 3.5–5.2)

## 2011-10-13 NOTE — Progress Notes (Signed)
Subjective:    Patient ID: Leslie Reed, female    DOB: 1946/07/14, 65 y.o.   MRN: [id number]  HPI Patient comes in for  new patient visit.  Her prev PCP was  [facility] Dr Roanna Epley but  He is now doing    sports medicine .She has seen various people as needed and gyne but  No usual preventive visits or check ups except gyne .   She has many issues but the most significant is concern and  Worried about  Memory  That has been going on. For a while.   Word retrieval. Names  No recollection of child hood stories for years and now it is getting worse.     Hard to commit numbers to memory.  Decrease vocabulary.  No hx of adhd but hard toremembers numbers .Family not worried    But she is feels she is hiding some or this  .    Not realizing and faking   Stories.   Traveling back and forth  [place] because of  Fathers declining health and alzheiners.   Urinary urgency recently but now   Better.    Gets low back pain   To radiating abd  Unsure cause  Not new.  Worse as lay down and hard to bend over.  Better with moving around.  Dr Elna Breslow her exercise  And eventually went away.  No anemia .  Last check labs  " A while back."   Hx of migraines . Gets sinus has and taking less meds  Also ha with nausea and vomiting  Dr Sidonie Dickens her    Ibuprofen.  "Does not  Sleep more than 3 hours a time."  For a while    Years  ? Why  Does have some leg movements but no sleep apnea sx.  Caffiene   Minimal.   But some flavored coffees.  On some med for osteopenia in the past  And stopped this  ? evista  . For leg cramps. At night but they continue.  Is phsically active and no exercise intolerance or major injury . Review of Systems Bleeding  canasa   Rectal bleeding eval.  Per Dr Juanda Chance   Sigmoid and had polyp. ? How often  To do fu.  HAs  Migraines sinus  In the am  Uses vicks  . Towel with head.   Hx TMJ  Had  Ct scan.  Sinus.minimizes pain.    Hx  of nasal steroids  .    ? When took last and ? If  helped. No cp sob fever weight loss . Wears glasses no recent vision check.       Objective:   Physical Exam WDWN in nad  Speech and increase motor activity. Verbal  .seems articulate. HEENT: Normocephalic ;atraumatic , Eyes;  PERRL, EOMs  Full, lids and conjunctiva clear,,Ears: no deformities, canals nl, TM landmarks normal, Nose: no deformity or discharge  Mouth : OP clear without lesion or edema . Neck: Supple without adenopathy or masses or bruits thyroid palpable? No nodules Chest:  Clear to A&P without wheezes rales or rhonchi CV:  S1-S2 no gallops or murmurs peripheral perfusion is normal Abdomen:  Sof,t normal bowel sounds without hepatosplenomegaly, no guarding rebound or masses no CVA tenderness MS nl rom no acute swelling some OA changes hands  Points to right si area as area of back pain Skin: normal capillary refill ,turgor , color: No acute rashes ,  petechiae or bruising NEURO: oriented x 3 CN 3-12 appear intact. No focal muscle weakness or atrophy. DTRs symmetrical. Gait WNL.  Grossly non focal. No tremor or abnormal movement.  Reviewed  Entry hx  info      Assessment & Plan:  65 yo new patient with many concerns and no regular except  gyne  H x of rectal bleeding .? Problematic    Colitis   Recurrent    Followed by dr  Juanda Chance.     Have her fu is needed Sleep:  Interrupted  ?  disorder  ? If RLD vs other .  PLM    Concern about memory father with alzheimers   Although stress denies sig anxiety depression but has chronic  sleep deprivation.   rec further evaluation  . Screening labs today and refer   For further evaluation. Inability to remember stories from childrens  childhood seems significant but not progressive .   HA  prob migraine   Dis 2 aleve or 800 ibu for now and do HA calendar  LBP prob mechanical  Check esr and [id number] with" hx of colitis"  Prolonged visit

## 2011-10-13 NOTE — Patient Instructions (Addendum)
I  would recommend we get labs screening and get a consult. Will contact you about this .  Then plan work up.  Calendar  Headaches and meds if used. Call dr Delia Chimes office about the rectal bleeding Follow  Up in a bout a month or as needed.

## 2011-10-15 LAB — FOLATE: Folate: 21.1 ng/mL (ref >5.9)

## 2011-12-28 ENCOUNTER — Encounter: Payer: Self-pay | Admitting: Internal Medicine

## 2011-12-28 ENCOUNTER — Ambulatory Visit (INDEPENDENT_AMBULATORY_CARE_PROVIDER_SITE_OTHER): Payer: PRIVATE HEALTH INSURANCE | Admitting: Internal Medicine

## 2011-12-28 ENCOUNTER — Ambulatory Visit (INDEPENDENT_AMBULATORY_CARE_PROVIDER_SITE_OTHER)
Admission: RE | Admit: 2011-12-28 | Discharge: 2011-12-28 | Disposition: A | Discharge status: Home / Self Care | Payer: PRIVATE HEALTH INSURANCE | Source: Ambulatory Visit | Attending: Internal Medicine | Admitting: Internal Medicine

## 2011-12-28 VITALS — BP 120/80 | HR 72 | Ht 64.25 in | Wt 142.0 lb

## 2011-12-28 DIAGNOSIS — R0789 Other chest pain: Secondary | ICD-10-CM

## 2011-12-28 DIAGNOSIS — R7989 Other specified abnormal findings of blood chemistry: Secondary | ICD-10-CM

## 2011-12-28 DIAGNOSIS — R071 Chest pain on breathing: Secondary | ICD-10-CM

## 2011-12-28 DIAGNOSIS — R945 Abnormal results of liver function studies: Secondary | ICD-10-CM

## 2011-12-28 DIAGNOSIS — G479 Sleep disorder, unspecified: Secondary | ICD-10-CM

## 2011-12-28 DIAGNOSIS — Z8669 Personal history of other diseases of the nervous system and sense organs: Secondary | ICD-10-CM

## 2011-12-28 DIAGNOSIS — G478 Other sleep disorders: Secondary | ICD-10-CM

## 2011-12-28 DIAGNOSIS — R413 Other amnesia: Secondary | ICD-10-CM

## 2011-12-28 DIAGNOSIS — Z0000 Encounter for general adult medical examination without abnormal findings: Secondary | ICD-10-CM

## 2011-12-28 NOTE — Patient Instructions (Addendum)
i want you still to fu with Dr Vickey Huger  Still unsure about  Cause of your memory and sleep issue.  Will  monitor liver abnormalities may be clinically  unimportant . And  Will do thyroid test at that time. In addition to some liver based labs. Will follow up depending onlab results.  Get a chest x ray for the right chest pain . Will let you know results.

## 2011-12-28 NOTE — Progress Notes (Signed)
Subjective:    Patient ID: Leslie Reed, female    DOB: 09/23/1946, 66 y.o.   MRN: [id number]  HPI Patient comes in today for preventive visit and follow-up of medical issues. Update of her history since her last visit.  Saw Dr Vickey Huger  And supposed to come back.   Sent to Dr Adele Dan  For psycho neurotesting.   Very busy and  memory illness.  Not sure insurance to pay so may not do this   Sleep  Awakening  Couple of times a night . Has logged her sleep pattern doesn't know why . hyperreflexic  On leg  / coming from back  Told to take aleve  By insurance co  For 6 weeks to be able to get scanning done. Is skeptical that this t will be helpful.  Labs done her dr D shoed nl cmp except  Alt up to 62 rest normal ana rpr b12 ok No tsh seen in  Paper report.     Review of Systems ROS:  GEN/ HEENTNo fever, significant weight changes sweats headaches vision problems hearing changes, CV/ PULM; No chest pain   Except right below breast  Area and  Hurts for 9 monsth  Gyne no dx. shortness of breath cough, syncope,edema  change in exercise tolerance. GI /GU: No adominal pain, vomiting, change in bowel habits. No blood in the stool. No significant GU symptoms. SKIN/HEME: ,no acute skin rashes suspicious lesions or bleeding. No lymphadenopathy, nodules, masses.  NEURO/ PSYCH:  No neurologic signs such as weakness numbness No depression anxiety. IMM/ Allergy: No unusual infections.  Allergy .   REST of 12 system review negative  Past Medical History  Diagnosis Date  . Osteopenia   . Hx of varicella   . Diverticulitis   . History of migraine headaches   . Hx of colonic polyp   . Arthritis     History   Social History  . Marital Status: Married    Spouse Name: N/A    Number of Children: N/A  . Years of Education: N/A   Occupational History  . Not on file.   Social History Main Topics  . Smoking status: Never Smoker   . Smokeless tobacco: Never Used  . Alcohol Use: Yes   socially  . Drug Use: No  . Sexually Active: Not on file   Other Topics Concern  . 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 Abdominal hysterectomy     partial  for pain no cancer    . Colon polyps   . Cesarean section     x4  . Cholecystectomy   . Appendectomy   . Breast biopsy     x 2   . Dilation and curettage of uterus     Family History  Problem Relation Age of Onset  . Diabetes Mother   . Pulmonary fibrosis Mother     59 deceased  . Hypertension Father   . Alzheimer's disease Father   . Kidney failure Father     Allergies  Allergen Reactions  . Pramoxine Hcl     REACTION: irritation on rectal area    No current outpatient prescriptions on file prior to visit.    BP 120/80  Pulse 72  Ht 5' 4.25" (1.632 m)  Wt 142 lb (64.411 kg)  BMI 24.19 kg/m2       Objective:   Physical Exam Physical Exam: Vital signs reviewed JYN:WGNF is a well-developed well-nourished alert cooperative  white female who appears her stated age in no acute distress.  HEENT: normocephalic atraumatic , Eyes: PERRL EOM's full, conjunctiva clear, Nares: paten,t no deformity discharge or tenderness., Ears: no deformity EAC's clear TMs with normal landmarks. Mouth: clear OP, no lesions, edema.  Moist mucous membranes. Dentition in adequate repair. NECK: supple without masses, thyromegaly or bruits. CHEST/PULM:  Clear to auscultation and percussion breath sounds equal no wheeze , rales or rhonchi. No chest wall deformities or tenderness. CV: PMI is nondisplaced, S1 S2 no gallops, murmurs, rubs. Peripheral pulses are full without delay.No JVD .  ABDOMEN: Bowel sounds normal nontender  No guard or rebound, no hepato splenomegal no CVA tenderness.  No hernia. Back mild scoliosis Extremtities:   No clubbing cyanosis or edema, no acute joint swelling or redness no focal atrophy NEURO:  Oriented x3, cranial nerves 3-12 appear to be intact, no obvious focal weakness,gait within normal limits SKIN: No acute rashes normal turgor, color, no bruising or petechiae. PSYCH: Oriented, good eye contact, no obvious depression anxiety, cognition and judgment appear normal. LN: no cervical axillary inguinal adenopathy Labs per dr D reviewed as well as sleep hx .       Assessment & Plan:  Preventive Health Care Counseled regarding healthy nutrition, exercise, sleep, injury prevention, calcium vit d and healthy weight .  Sleep disruption   lft alt 62  Still ok to  Take aleve short term  But disc   Pos neg of this approach.  Abnormal memory issues   Under neuro eval .  Strongly advised to fu with Dr D office even if cant do the neuropsych testing for cost. CWP? Unsure cause  Doesn't seem alarming otherwise but get a chest x ray  . LBP

## 2011-12-29 NOTE — Progress Notes (Signed)
Quick Note:  Tell patient that x ray shows no acute abnormality. ______ 

## 2011-12-29 NOTE — Progress Notes (Signed)
Quick Note:  Pt aware of results. ______ 

## 2012-01-02 DIAGNOSIS — R0789 Other chest pain: Secondary | ICD-10-CM | POA: Insufficient documentation

## 2012-01-02 DIAGNOSIS — R945 Abnormal results of liver function studies: Secondary | ICD-10-CM | POA: Insufficient documentation

## 2012-01-12 ENCOUNTER — Other Ambulatory Visit: Payer: PRIVATE HEALTH INSURANCE

## 2012-01-12 ENCOUNTER — Other Ambulatory Visit (INDEPENDENT_AMBULATORY_CARE_PROVIDER_SITE_OTHER): Payer: PRIVATE HEALTH INSURANCE

## 2012-01-12 DIAGNOSIS — R945 Abnormal results of liver function studies: Secondary | ICD-10-CM

## 2012-01-12 DIAGNOSIS — R7989 Other specified abnormal findings of blood chemistry: Secondary | ICD-10-CM

## 2012-01-12 LAB — IBC PANEL
Iron: 77 ug/dL (ref 42–145)
Saturation Ratios: 22.7 % (ref 20.0–50.0)
Transferrin: 242.7 mg/dL (ref 212.0–360.0)

## 2012-01-12 LAB — HEPATIC FUNCTION PANEL
Albumin: 4.2 g/dL (ref 3.5–5.2)
Alkaline Phosphatase: 62 U/L (ref 39–117)
Total Protein: 6.8 g/dL (ref 6.0–8.3)

## 2012-01-13 LAB — HEPATITIS B E ANTIGEN: Hepatitis Be Antigen: NEGATIVE

## 2012-01-13 LAB — HEPATITIS C ANTIBODY: HCV Ab: NEGATIVE

## 2012-01-13 LAB — HEPATITIS B CORE ANTIBODY, TOTAL: Hep B Core Total Ab: NEGATIVE

## 2012-01-21 ENCOUNTER — Encounter: Payer: Self-pay | Admitting: *Deleted

## 2012-01-21 NOTE — Progress Notes (Signed)
Quick Note:  Mailed letter to pt ______ 

## 2012-11-29 DIAGNOSIS — W57XXXA Bitten or stung by nonvenomous insect and other nonvenomous arthropods, initial encounter: Secondary | ICD-10-CM

## 2012-11-29 HISTORY — DX: Bitten or stung by nonvenomous insect and other nonvenomous arthropods, initial encounter: W57.XXXA

## 2013-02-22 ENCOUNTER — Ambulatory Visit (INDEPENDENT_AMBULATORY_CARE_PROVIDER_SITE_OTHER): Payer: BC Managed Care – PPO | Admitting: Obstetrics & Gynecology

## 2013-02-22 ENCOUNTER — Encounter: Payer: Self-pay | Admitting: Obstetrics & Gynecology

## 2013-02-22 VITALS — BP 118/74 | Ht 64.5 in | Wt 139.2 lb

## 2013-02-22 DIAGNOSIS — Z01419 Encounter for gynecological examination (general) (routine) without abnormal findings: Secondary | ICD-10-CM

## 2013-02-22 DIAGNOSIS — Z0000 Encounter for general adult medical examination without abnormal findings: Secondary | ICD-10-CM

## 2013-02-22 LAB — POCT URINALYSIS DIPSTICK
Glucose, UA: NEGATIVE
Ketones, UA: NEGATIVE
Leukocytes, UA: NEGATIVE
Urobilinogen, UA: NEGATIVE

## 2013-02-22 NOTE — Patient Instructions (Signed)

## 2013-02-22 NOTE — Progress Notes (Signed)
67 y.o. [id number] MarriedCaucasianF here for annual exam.  Doing well.  Expressed frustrations with our phone system.  Reports some recent breast tenderness.  Drinking more coffee recently.  No LMP recorded. Patient has had a hysterectomy.          Sexually active: yes  The current method of family planning is status post hysterectomy.    Exercising: yes  work, physical Smoker:  no  Health Maintenance: Pap:  11/11/08 WNL MMG:  06/05/12 normal Colonoscopy:  2009 repeat in 10 years/flexible sigmoid, repeated in 1/12 secondary rectal bleeding, had large polyp--follow-up 5 years (2017) BMD:   06/05/12 stable osteopenia.  She declines therapy.  May repeat in four to five years but right now doesn't want to do anything. TDaP:  9/07.  Has received Zostavax    reports that she has never smoked. She has never used smokeless tobacco. She reports that  drinks alcohol. She reports that she does not use illicit drugs.  Past Medical History  Diagnosis Date  . Osteopenia   . Hx of varicella   . Diverticulitis   . History of migraine headaches   . Hx of colonic polyp   . Arthritis   . 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 Mitral valve prolapse   . Colitis 8/04  . Scoliosis 2005    Past Surgical History  Procedure Laterality Date  . Abdominal hysterectomy      partial  for pain no cancer    . Colon polyps    . Cesarean section      x4  . Cholecystectomy    . Appendectomy    . Breast biopsy      x 2   . Dilation and curettage of uterus      Current Outpatient Prescriptions  Medication Sig Dispense Refill  . diphenhydrAMINE (SOMINEX) 25 MG tablet Take 25 mg by mouth at bedtime as needed.      . naproxen sodium (ALEVE) 220 MG tablet Take 220 mg by mouth 2 (two) times daily with a meal.       No current facility-administered medications for this visit.    Family History  Problem Relation Age of Onset  . Diabetes Mother   . Pulmonary fibrosis Mother     21 deceased  . Hypertension Father   .  Alzheimer's disease Father   . Kidney failure Father   . Diabetes Maternal Grandmother   . Cancer Maternal Grandfather   . Hypertension Mother     ROS:  Pertinent items are noted in HPI.  Otherwise, a comprehensive ROS was negative.  Exam:   BP 118/74  Ht 5' 4.5" (1.638 m)  Wt 139 lb 3.2 oz (63.141 kg)  BMI 23.53 kg/m2 Height:   Height: 5' 4.5" (163.8 cm)  Ht Readings from Last 3 Encounters:  02/22/13 5' 4.5" (1.638 m)  12/28/11 5' 4.25" (1.632 m)  10/13/11 5' 4.5" (1.638 m)    General appearance: alert, cooperative and appears stated age Head: Normocephalic, without obvious abnormality, atraumatic Neck: no adenopathy, supple, symmetrical, trachea midline and thyroid not enlarged, symmetric, no tenderness/mass/nodules Lungs: clear to auscultation bilaterally Breasts: Inspection negative, No nipple retraction or dimpling, No nipple discharge or bleeding, No axillary or supraclavicular adenopathy, Normal to palpation without dominant masses Heart: regular rate and rhythm Abdomen: soft, non-tender; bowel sounds normal; no masses,  no organomegaly Extremities: extremities normal, atraumatic, no cyanosis or edema Skin: Skin color, texture, turgor normal. No rashes or lesions Lymph nodes: Cervical, supraclavicular, and axillary nodes  normal. No abnormal inguinal nodes palpated Neurologic: Grossly normal   Pelvic: External genitalia:  no lesions              Urethra:  normal appearing urethra with no masses, tenderness or lesions              Bartholins and Skenes: normal                 Vagina: normal appearing vagina with normal color and discharge, no lesions              Cervix: absent              Pap taken: no Bimanual Exam:  Uterus:  uterus absent              Adnexa: no mass, fullness, tenderness               Rectovaginal: Confirms               Anus:  normal sphincter tone, no lesions  A:  Well Woman with normal exam, no HRT Osteopenia Colonic polyps H/O  fibroadenoma MVP  P:   Mammogram Will plan labs next year return annually or prn  An After Visit Summary was printed and given to the patient.

## 2013-02-28 LAB — HEMOGLOBIN, FINGERSTICK: Hemoglobin, fingerstick: 14.1 g/dL (ref 12.0–16.0)

## 2013-04-18 ENCOUNTER — Ambulatory Visit (INDEPENDENT_AMBULATORY_CARE_PROVIDER_SITE_OTHER): Payer: BC Managed Care – PPO | Admitting: Internal Medicine

## 2013-04-18 ENCOUNTER — Ambulatory Visit: Payer: Self-pay | Admitting: Internal Medicine

## 2013-04-18 ENCOUNTER — Telehealth: Payer: Self-pay | Admitting: Internal Medicine

## 2013-04-18 ENCOUNTER — Encounter: Payer: Self-pay | Admitting: Internal Medicine

## 2013-04-18 VITALS — BP 140/76 | HR 73 | Temp 97.9°F

## 2013-04-18 DIAGNOSIS — Z23 Encounter for immunization: Secondary | ICD-10-CM

## 2013-04-18 DIAGNOSIS — IMO0001 Reserved for inherently not codable concepts without codable children: Secondary | ICD-10-CM

## 2013-04-18 DIAGNOSIS — W57XXXA Bitten or stung by nonvenomous insect and other nonvenomous arthropods, initial encounter: Secondary | ICD-10-CM | POA: Insufficient documentation

## 2013-04-18 MED ORDER — DOXYCYCLINE HYCLATE 100 MG PO CAPS: 100.0000 mg | ORAL_CAPSULE | Freq: Two times a day (BID) | ORAL | Status: DC

## 2013-04-18 NOTE — Patient Instructions (Addendum)
I agree this looks like a local reaction to the removal of the tic and a mild early infection. It may get better with local care antibiotic ointment and observation.  However if increasing in size and looking more infected add the doxycycline. Contact us if you're getting fever or other serious symptoms.

## 2013-04-18 NOTE — Progress Notes (Signed)
Chief Complaint  Patient presents with  . Insect Bite    Removed a tick on "Sunday that was on her back left side.    HPI: Patient comes in today for SDA for  new problem evaluation. [place] or tag on her left upper thorax lateral and taking a shower it was irritated. Noted it was a tick her husband and her that her daughter proceeded to try to remove it her husband with a match and then pulled it with tweezers. Was checked again became sore uncertain if anything left in the area can she can't see it last night use antibiotic ointment and covered it is still sore today she doesn't think it's back hurt no systemic symptoms fever. She brings in the tic with her.  She thinks she is due for a tetanus shot.  ROS: See pertinent positives and negatives per HPI. No systemic symptoms at present does have allergy and sinus headaches  Past Medical History  Diagnosis Date  . Osteopenia   . Hx of varicella   . Diverticulitis   . History of migraine headaches   . Hx of colonic polyp   . Arthritis   . 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 Mitral valve prolapse   . Colitis 8/04  . Scoliosis 2005    Family History  Problem Relation Age of Onset  . Diabetes Mother   . Pulmonary fibrosis Mother     74"  deceased  . Hypertension Father   . Alzheimer's disease Father   . Kidney failure Father   . Diabetes Maternal Grandmother   . Cancer Maternal Grandfather   . Hypertension Mother     History   Social History  . Marital Status: Married    Spouse Name: N/A    Number of Children: N/A  . Years of Education: N/A   Social History Main Topics  . Smoking status: Never Smoker   . Smokeless tobacco: Never Used  . Alcohol Use: Yes     Comment: socially  . Drug Use: No  . Sexually Active: Yes -- Female partner(s)    Birth Control/ Protection: Surgical     Comment: TAH   Other Topics Concern  . 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 doxycycline (VIBRAMYCIN) 100 MG capsule Take 1 capsule (100 mg total) by mouth 2 (two) times daily.  14 capsule  0   No facility-administered encounter medications on file as of 04/18/2013.    EXAM:  BP 140/76  Pulse 73  Temp(Src) 97.9 F (36.6 C) (Oral)  SpO2 98%  There is no weight on file to calculate BMI.  GENERAL: vitals reviewed and listed above, alert, oriented, appears well hydrated and in no acute distress   NECK: no obvious masses on inspection palpation  Skin left lateral thorax near bra line shows a 3-4 mm bright red indurated area with no foreign body palpated in mild erythema around it without any streaking she does have some redness related to the Band-Aid adhesive. There is no pus induration or other swelling there is no target rash. CV:  HRRR, no clubbing cyanosis or  peripheral edema nl cap refill  MS: moves all extremities without noticeable focal  abnormality PSYCH: pleasant and cooperative, no obvious depression or anxiety Tick she presents is not a nymph tick about 4 mm  Larger  ASSESSMENT AND PLAN:  Discussed the following assessment and plan:  Infected tick bite, initial encounter - May resolve on its own local care add doxycycline as directed if progressive. Contact us if systemic symptoms  Need for Tdap vaccination - Plan: Tdap vaccine greater than or equal to 7yo IM  -Patient advised to return or notify health care team  if symptoms worsen or persist or new concerns arise.  Patient Instructions  I agree this looks like a local reaction to the removal of the tic and a mild early infection. It may get better with local care antibiotic ointment and observation.  However if increasing in size and looking  more infected add the doxycycline. Contact us if you're getting fever or other serious symptoms.     Neta Mends. Almer Bushey M.D.

## 2013-05-03 ENCOUNTER — Ambulatory Visit (INDEPENDENT_AMBULATORY_CARE_PROVIDER_SITE_OTHER): Payer: BC Managed Care – PPO | Admitting: Sports Medicine

## 2013-05-03 VITALS — BP 126/76 | Ht 64.0 in | Wt 138.0 lb

## 2013-05-03 DIAGNOSIS — M7989 Other specified soft tissue disorders: Secondary | ICD-10-CM

## 2013-05-03 DIAGNOSIS — M25471 Effusion, right ankle: Secondary | ICD-10-CM | POA: Insufficient documentation

## 2013-05-03 DIAGNOSIS — M25472 Effusion, left ankle: Secondary | ICD-10-CM | POA: Insufficient documentation

## 2013-05-03 MED ORDER — DOXYCYCLINE HYCLATE 100 MG PO CAPS: 100.0000 mg | ORAL_CAPSULE | Freq: Every day | ORAL | Status: DC

## 2013-05-03 MED ORDER — CLINDAMYCIN PHOSPHATE 1 % EX GEL: Freq: Two times a day (BID) | CUTANEOUS | Status: AC

## 2013-05-03 NOTE — Assessment & Plan Note (Signed)
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

## 2013-05-03 NOTE — Patient Instructions (Addendum)
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 This is most likely related to the tic bite you had and is called (STARI) [place] Tic associated Rash Illness. Take the antibiotic as prescribed for 1 month. Wear the ankle compression sleeves for comfort. Please obtain the labs that were ordered.

## 2013-05-03 NOTE — Progress Notes (Signed)
Patient ID: Leslie Reed, female   DOB: 06/23/1946, 67 y.o.   MRN: [id number] S: Pt notes bilateral ankle swelling that started 4 days ago. She also note that 4 days ago "all my joints ached." The discomfort in the other joints improved. But the ankles have continued to be swollen with burning pain and warmth.  Pt notes that about 2 weeks ago she noticed a tic behind her left axilla. She believes it has been there for 3-4 days as she had felt a discomfort, but did not see the tic initially. She went to her PCP on the Monday who noted only a small area of erythema and gave her a script for Doxy, but said she could wait to fill if it did not improve. At that time, Pt also noted a circular spotted rash around the area that she associated the removal of the band-aid she has used to cover the area and was rotating around the spot. Received Tetanus vaccine from PCP.   Pt also notes stepping on a rusty tack two days after PCP visit.   O: Bilateral swelling of ankles anterior to the lateral and medial malleoli. No tenderness to palpation, increased warmth, no erythema. Swelling R>L.  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 She denies medication use other than the doxycycline, denies recent viral illness or sick contacts, denies long term symptoms. Given history of recent (2 weeks) tic bite and associated rash this is most likely STARI.   P: Treat with extended course of Doxycycline 100mg  daily for 30 days.  Symptomatic treatment with compression sleeve to bilateral ankles. Activity as tolerated.  Obtain CBC with dif and ESR.  Refilled topical clindamycin.

## 2013-05-04 ENCOUNTER — Other Ambulatory Visit: Payer: Self-pay | Admitting: *Deleted

## 2013-05-04 LAB — CBC
Hemoglobin: 13 g/dL (ref 12.0–15.0)
RBC: 4.42 MIL/uL (ref 3.87–5.11)

## 2013-05-04 LAB — SEDIMENTATION RATE: Sed Rate: 5 mm/hr (ref 0–22)

## 2013-05-04 MED ORDER — MELOXICAM 15 MG PO TABS: 15.0000 mg | ORAL_TABLET | Freq: Every day | ORAL | Status: DC

## 2013-05-04 NOTE — Addendum Note (Signed)
Addended by: Herminio Heads on: 05/04/2013 09:45 AM   Modules accepted: Orders

## 2013-05-10 ENCOUNTER — Telehealth: Payer: Self-pay | Admitting: *Deleted

## 2013-05-10 NOTE — Telephone Encounter (Signed)
Ankles are much better, knees still a little painful.  Overall significantly better!  Has been on feet a lot dealing with bursted hot water heater.  Advised labs normal. She will follow up next Thursday.

## 2013-05-10 NOTE — Telephone Encounter (Signed)
Message copied by Mora Bellman on Thu May 10, 2013  2:43 PM ------      Message from: Enid Baas      Created: Wed May 09, 2013  9:38 PM       Labs are OK ;  Let's call and check on progress      ----- Message -----         From: [facility] Interface         Sent: 05/04/2013   8:00 PM           To: Enid Baas, MD                   ------

## 2013-05-16 ENCOUNTER — Telehealth: Payer: Self-pay | Admitting: *Deleted

## 2013-05-16 NOTE — Telephone Encounter (Signed)
Message copied by Jacki Cones C on Wed May 16, 2013  2:06 PM ------      Message from: CERESI, MELANIE L      Created: Wed May 16, 2013  8:08 AM      Regarding: phone message      Contact: [phone]       Swelling in knees/feet can barely walk or stand  due to tick bite, she would like a call regarding maybe getting another med. ------

## 2013-05-16 NOTE — Telephone Encounter (Signed)
Pt states she is feeling better now, about the same as yesterday.  Saw great improvement over 1st week with doxycycline, but swelling and pain have been increasing again.  She will discuss with Dr. Darrick Penna tomorrow morning at her appt.  She is to call office this afternoon if she has any problems or concerns.

## 2013-05-17 ENCOUNTER — Encounter: Payer: Self-pay | Admitting: Sports Medicine

## 2013-05-17 ENCOUNTER — Ambulatory Visit (INDEPENDENT_AMBULATORY_CARE_PROVIDER_SITE_OTHER): Payer: BC Managed Care – PPO | Admitting: Sports Medicine

## 2013-05-17 VITALS — BP 119/79 | HR 88 | Ht 64.0 in | Wt 138.0 lb

## 2013-05-17 DIAGNOSIS — M7989 Other specified soft tissue disorders: Secondary | ICD-10-CM

## 2013-05-17 DIAGNOSIS — M25471 Effusion, right ankle: Secondary | ICD-10-CM

## 2013-05-17 NOTE — Patient Instructions (Addendum)
Time, rest, and elevation will help this  Continue wearing ankle sleeves for compression  Continue doxycycline   Take meloxicam daily for inflammation  Please follow up in 2 weeks  Thank you for seeing [place] today!

## 2013-05-17 NOTE — Assessment & Plan Note (Signed)
This is continued to act like a reactive arthritis after tick bite  We will treat like this is a Lyme-type disease and keep her on doxycycline for 28 days  Keep using compression  Limit activity  Recheck in 2 weeks

## 2013-05-17 NOTE — Progress Notes (Signed)
Patient ID: Leslie Reed, female   DOB: 26-Aug-1946, 67 y.o.   MRN: [id number]  Patient thought to have tick related reactive arhtritis Lyme disease not confirmed Did have tick bite and rash Seen with bilat ankle swelling  First week on Doxy this improved Second week slipped back somewhat particularly after missing dose of Doxy and 1 day when she did not wear the compression sleeves  Now seems to have more knee involvement- more aching. Has felt chills at night, but checked temp and it was 99.  Wearing body helix ankle sleeves most of the time, did have some skin irritation, but this is better now.   Physical exam:  No acute distress   No skin rash  Bilateral ankles warm Swelling mild now vs moderate localized over sinus tarsi bilat Motion good and pain free bilat Ligaments stabile bilat Rt retrocalcaneal bursa swollen  Knee exam: No warmth or swelling bilat  Elbow exam: No warmth or swelling bilat  Wrist exam: No warmth or swelling bilat  Ultrasound Swelling is noted around the ankle joint and in the right ankle more in the sinus tarsi and, the retrocalcaneal bursa and along the peroneal tendon sheaths The posterior tibial tendon sheath has minimal swelling  Left shows some of same swelling pattern but less

## 2013-05-31 ENCOUNTER — Ambulatory Visit (INDEPENDENT_AMBULATORY_CARE_PROVIDER_SITE_OTHER): Payer: BC Managed Care – PPO | Admitting: Sports Medicine

## 2013-05-31 VITALS — BP 119/77 | Ht 64.0 in | Wt 138.0 lb

## 2013-05-31 DIAGNOSIS — M25471 Effusion, right ankle: Secondary | ICD-10-CM

## 2013-05-31 DIAGNOSIS — M7989 Other specified soft tissue disorders: Secondary | ICD-10-CM

## 2013-05-31 DIAGNOSIS — M25472 Effusion, left ankle: Secondary | ICD-10-CM

## 2013-05-31 MED ORDER — CAPSICUM OLEORESIN 0.025 % EX CREA: TOPICAL_CREAM | CUTANEOUS | Status: DC

## 2013-05-31 NOTE — Patient Instructions (Addendum)
Increase activity with hands (washing dishes, etc)  Try capsicum cream twice daily on small joints in hands that are painful and swelling.  Finish doxycycline.   Stop taking meloxicam; may be causing some swelling. Try Alleve prn for pain and swelling.  Continue ankle bracing for swelling. Take off at night. Elevate legs to help with swelling.

## 2013-05-31 NOTE — Progress Notes (Signed)
[facility] [address] [place], [place] [address] Phone: [phone] Fax: [phone]   Patient Name: Leslie Reed Date of Birth: [birth year] Medical Record Number: [id number] Gender: female Date of Encounter: 05/31/2013  History of Present Illness:  Leslie Reed is a 67 y.o. very pleasant female patient who presents with the following:  67 y/o CF presenting for follow up for tick borne illness and reactive arthritis. Tick bite on Lt shoulder about 6 weeks prior. Had trouble with arthralgias and myalgias. Was started on doxycycline 100 mg daily. Also given meloxicam and Body Helix ankle braces from compression therapy. States that she is doing well today. About "90% better". Still having some swelling in b/l ankles and knees. Wearing bracing throughout the day. Takes them off at night. Has pain in b/l arches worse in the AM just after waking up. Able to stretch these areas by walking and they feel better. Meloxicam helps some with swelling. Not using formal exercises. Does not use orthotics. Wears a variety of shoes during the day. Some with arch support, but most without. Not otherwise doing any other exercises or using arch supports.   Patient Active Problem List   Diagnosis Date Noted  . Swelling of both ankles 05/03/2013  . Infected tick bite 04/18/2013  . LFTs abnormal 01/02/2012  . Right-sided chest wall pain 01/02/2012  . Visit for preventive health examination 12/28/2011  . Rectal bleeding 10/13/2011  . Sleep disturbance 10/13/2011  . Memory difficulty 10/13/2011  . Migraine headache 10/13/2011  . History of migraine headaches   . Diverticulitis   . Low back pain 06/21/2011  . ASTHMA, INTERMITTENT 09/01/2010  . RECTAL BLEEDING 02/24/2010  . ABDOMINAL PAIN-RLQ 02/24/2010  . PANCOLITIS 03/12/2009  . COLONIC POLYPS, ADENOMATOUS, HX OF 03/12/2009  . 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 THORACOLUMBAR SCOLIOSIS, MILD 02/29/2008  .  CHEST DISCOMFORT 02/29/2008  . PLANTAR FASCIITIS, LEFT 10/31/2007  . COLON POLYP 01/26/2007  . RHINITIS, ALLERGIC 01/26/2007  . REFLUX ESOPHAGITIS 01/26/2007  . DIVERTICULOSIS OF COLON 01/26/2007  . IRRITABLE BOWEL SYNDROME 01/26/2007  . OSTEOPENIA 01/26/2007   Past Medical History  Diagnosis Date  . Osteopenia   . Hx of varicella   . Diverticulitis   . History of migraine headaches   . Hx of colonic polyp   . Arthritis   . 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 Mitral valve prolapse   . Colitis 8/04  . Scoliosis 2005   Past Surgical History  Procedure Laterality Date  . Abdominal hysterectomy      partial  for pain no cancer    . Colon polyps    . Cesarean section      x4  . Cholecystectomy    . Appendectomy    . Breast biopsy      x 2   . Dilation and curettage of uterus     History  Substance Use Topics  . Smoking status: Never Smoker   . Smokeless tobacco: Never Used  . Alcohol Use: Yes     Comment: socially   Family History  Problem Relation Age of Onset  . Diabetes Mother   . Pulmonary fibrosis Mother     63 deceased  . Hypertension Father   . Alzheimer's disease Father   . Kidney failure Father   . Diabetes Maternal Grandmother   . Cancer Maternal Grandfather   . Hypertension Mother    Allergies  Allergen Reactions  . Pramoxine Hcl     REACTION:  irritation on rectal area    Medication list has been reviewed and updated.  Prior to Admission medications   Medication Sig Start Date End Date Taking? Authorizing Provider  clindamycin (CLINDAGEL) 1 % gel Apply topically 2 (two) times daily. 05/03/13 05/03/14  Enid Baas, MD  doxycycline (VIBRAMYCIN) 100 MG capsule Take 1 capsule (100 mg total) by mouth daily. 05/03/13   Enid Baas, MD  meloxicam (MOBIC) 15 MG tablet Take 1 tablet (15 mg total) by mouth daily. 05/04/13   Enid Baas, MD    Review of Systems:  No fevers, chills, SOB, abd pain, nausea, vomiting, diarrhea, rash.  Physical Examination: Filed  Vitals:   05/31/13 1014  BP: 119/77   Filed Vitals:   05/31/13 1014  Height: 5\' 4"  (1.626 m)  Weight: 138 lb (62.596 kg)   Body mass index is 23.68 kg/(m^2).  Gen: NAD. Pt is pleasant and cooperative. MSK: Mild swelling noted in b/l ankles. Decreased from last visit. TTP at calcaneal tuberosity Lt > Rt foot. Some mild TTP at achilles insertion to calcaneus of Lt foot. FROM in flexion/extesion, abduction/abduction. Anterior drawer neg b/l. Talar tilt neg b/l.Marland Kitchen Thompson neg b/l.  Assessment and Plan: 1. Tick borne illness - finish course of doxycycline. Counseled that patient may continue to have mylagias and swelling for 3 months afterwards. Contiue to follow up with PCP.  2. B/l ankle swelling - most likely secondary to tick borne illness. Will stop meloxicam; may be causing some swelling. Try Alleve prn for pain and swelling. Continue ankle bracing for swelling. Take off at night. Elevate legs to help with swelling.   3. Pain/swelling of PIPs and DIPs - has had negative workup for RA by PCP. 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 Increase activity with hands (washing dishes, etc). Try capsicum cream twice daily on small joints in hands that are painful and swelling.    Enid Baas, MD

## 2013-05-31 NOTE — Assessment & Plan Note (Signed)
This seems to be a reactive arthritis after tick bite  See the plan outlined in her office visit

## 2013-06-19 LAB — HM MAMMOGRAPHY: HM Mammogram: NEGATIVE

## 2013-06-20 ENCOUNTER — Encounter: Payer: Self-pay | Admitting: Internal Medicine

## 2013-09-05 ENCOUNTER — Encounter: Payer: Self-pay | Admitting: Internal Medicine

## 2013-11-15 ENCOUNTER — Telehealth: Payer: Self-pay | Admitting: *Deleted

## 2013-11-15 MED ORDER — DOXYCYCLINE HYCLATE 100 MG PO CAPS: 100.0000 mg | ORAL_CAPSULE | Freq: Every day | ORAL | Status: DC

## 2013-11-15 NOTE — Telephone Encounter (Signed)
Pt states she is still having a burning sensation in both ankles- same as when she was treated for tick bite related ankle swelling and pain earlier this year.  She is not experiencing ankle swelling now.  Per Dr. Darrick Penna advised restart doxycycline, as she may be experiencing a reoccurance of the reactive arthritis related to the tick bite.  Pt already scheduled for a f/u 12/05/12.  Asked her to call back in 7-10 days to let us know if the pain is improving.

## 2013-12-05 ENCOUNTER — Encounter: Payer: Self-pay | Admitting: Sports Medicine

## 2013-12-05 ENCOUNTER — Ambulatory Visit (INDEPENDENT_AMBULATORY_CARE_PROVIDER_SITE_OTHER): Payer: Managed Care, Other (non HMO) | Admitting: Sports Medicine

## 2013-12-05 VITALS — BP 137/82 | HR 85 | Ht 64.0 in | Wt 138.0 lb

## 2013-12-05 DIAGNOSIS — G479 Sleep disorder, unspecified: Secondary | ICD-10-CM

## 2013-12-05 DIAGNOSIS — M7989 Other specified soft tissue disorders: Secondary | ICD-10-CM

## 2013-12-05 DIAGNOSIS — M25472 Effusion, left ankle: Principal | ICD-10-CM

## 2013-12-05 DIAGNOSIS — M25471 Effusion, right ankle: Secondary | ICD-10-CM

## 2013-12-05 MED ORDER — GABAPENTIN 100 MG PO CAPS: 100.0000 mg | ORAL_CAPSULE | Freq: Three times a day (TID) | ORAL | Status: DC

## 2013-12-05 NOTE — Assessment & Plan Note (Signed)
Symptoms today sound almost more like restless leg and this seems to affect her ankles  She describes waking up moving the legs all around maybe the ankles all around and the symptoms improved  The same is true during the day when she is moving her legs and ankles she has very little pain or sensitivity  I suggested trying low-dose gabapentin at 100 mg each bedtime and increase to 300 mg each bedtime or higher to see if she gets a better sleep pattern  If She does get better control some of the restless leg type symptoms this might be an easy solution  Otherwise I think she should followup with Dr. Brett Fairy for further sleep evaluation

## 2013-12-05 NOTE — Assessment & Plan Note (Signed)
She may try some topical Capzin but actually see less evidence that this is similar to the reactive arthritis she had after the tick bite

## 2013-12-05 NOTE — Patient Instructions (Signed)
For sleep disturbance Start at 1 gabapentin at night and every 5 days add one until you sleep well side effects drowsiness and funny dreams  Try topical capsacian on ankles  Stop doxycylcine  Let me know how this works after 1 month

## 2013-12-05 NOTE — Progress Notes (Signed)
Patient ID: Leslie Reed, female   DOB: Apr 07, 1946, 68 y.o.   MRN: [id number]  Patient with Hx of tick borne illness Now has a flare of bilateral ankle sxs We restarted doxycylcine and that has led to less swelling at end of day Discomfort not much different p 2 wks  Wakes up at night with 'restless ankles" - cold helps discomfort Feels hot and burning at night  Other joints not a problem Ankles rarely affect her except at night  Has sleep disturbance Rarely ever gets 5 hrs straight sleep Saw Dr Brett Fairy at one point but has not done any followup   Her leg and ankle symptoms are actually much worse on nights when she has poor sleep  Physical examination  No acute distress BP 137/82  Pulse 85  Ht 5\' 4"  (1.626 m)  Wt 138 lb (62.596 kg)  BMI 23.68 kg/m2  Shoulder elbow and wrist motion is normal Hands show moderate changes of degenerative joint disease primarily at the PIP and DIP joints with some nodules Hip and knee motion is normal  Ankle motion is normal and there is no sign of ankle effusion today There was some sensitivity to light touch but no real pain No redness or warmth

## 2014-05-24 ENCOUNTER — Encounter: Payer: Self-pay | Admitting: Obstetrics & Gynecology

## 2014-05-24 ENCOUNTER — Ambulatory Visit (INDEPENDENT_AMBULATORY_CARE_PROVIDER_SITE_OTHER): Payer: Managed Care, Other (non HMO) | Admitting: Obstetrics & Gynecology

## 2014-05-24 VITALS — BP 120/72 | HR 72 | Resp 20 | Ht 64.25 in | Wt 137.8 lb

## 2014-05-24 DIAGNOSIS — Z0000 Encounter for general adult medical examination without abnormal findings: Secondary | ICD-10-CM

## 2014-05-24 DIAGNOSIS — Z01419 Encounter for gynecological examination (general) (routine) without abnormal findings: Secondary | ICD-10-CM

## 2014-05-24 LAB — POCT URINALYSIS DIPSTICK
BILIRUBIN UA: NEGATIVE
Blood, UA: NEGATIVE
Glucose, UA: NEGATIVE
LEUKOCYTES UA: NEGATIVE
Nitrite, UA: NEGATIVE
PH UA: 5
PROTEIN UA: NEGATIVE
Urobilinogen, UA: NEGATIVE

## 2014-05-24 NOTE — Progress Notes (Signed)
68 y.o. [id number] MarriedCaucasianF here for annual exam.  No vaginal bleeding.  Having some rectal bleeding since Monday.  Had diarrhea on Monday that was caused by [employer].  She has recurrent issues with this but likes this type of coffee so she really tries to limit it.  Had colonoscopy 1/12.  Had polyp that was present.    PCP is Dr. Regis Bill.  Last cholesterol was 2012.    Patient's last menstrual period was 11/29/1986.          Sexually active: Yes.    The current method of family planning is status post hysterectomy.    Exercising: Yes.    at work Smoker:  no  Health Maintenance: Pap:  11/11/08 WNL History of abnormal Pap:  no MMG:  06/19/13 3D-normal Colonoscopy:  1/12-repeat in 5 years-had some blood in stool 05/21/14 BMD:   06/05/12-stable with osteopenia in the hip TDaP:   04/18/13 Screening Labs: cholesterol 2012, Hb today: will return, Urine today: KETONES-1+   reports that she has never smoked. She has never used smokeless tobacco. She reports that she drinks alcohol. She reports that she does not use illicit drugs.  Past Medical History  Diagnosis Date  . Osteopenia   . Hx of varicella   . Diverticulitis   . History of migraine headaches   . Hx of colonic polyp   . Arthritis   . 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 Mitral valve prolapse   . Colitis 8/04  . Scoliosis 2005  . Tick bite 2014    STARI-antibiotics x 5 weeks    Past Surgical History  Procedure Laterality Date  . Abdominal hysterectomy      partial  for pain no cancer    . Colon polyps    . Cesarean section      x4  . Cholecystectomy    . Appendectomy    . Breast biopsy      x 2   . Dilation and curettage of uterus      No current outpatient prescriptions on file.   No current facility-administered medications for this visit.    Family History  Problem Relation Age of Onset  . Diabetes Mother   . Pulmonary fibrosis Mother     42 deceased  . Hypertension Father   . Alzheimer's  disease Father   . Kidney failure Father   . Diabetes Maternal Grandmother   . Cancer Maternal Grandfather   . Hypertension Mother     ROS:  Pertinent items are noted in HPI.  Otherwise, a comprehensive ROS was negative.  Exam:   BP 120/72  Pulse 72  Resp 20  Ht 5' 4.25" (1.632 m)  Wt 137 lb 12.8 oz (62.506 kg)  BMI 23.47 kg/m2  LMP 11/29/1986   Height: 5' 4.25" (163.2 cm)  Ht Readings from Last 3 Encounters:  05/24/14 5' 4.25" (1.632 m)  12/05/13 5\' 4"  (1.626 m)  05/31/13 5\' 4"  (1.626 m)    General appearance: alert, cooperative and appears stated age Head: Normocephalic, without obvious abnormality, atraumatic Neck: no adenopathy, supple, symmetrical, trachea midline and thyroid normal to inspection and palpation Lungs: clear to auscultation bilaterally Breasts: normal appearance, no masses or tenderness Heart: regular rate and rhythm Abdomen: soft, non-tender; bowel sounds normal; no masses,  no organomegaly Extremities: extremities normal, atraumatic, no cyanosis or edema Skin: Skin color, texture, turgor normal. No rashes or lesions Lymph nodes: Cervical, supraclavicular, and axillary nodes normal. No abnormal inguinal nodes palpated Neurologic: Grossly normal  Pelvic: External genitalia:  no lesions              Urethra:  normal appearing urethra with no masses, tenderness or lesions              Bartholins and Skenes: normal                 Vagina: normal appearing vagina with normal color and discharge, no lesions              Cervix: absent              Pap taken: No. Bimanual Exam:  Uterus:  uterus absent              Adnexa: normal adnexa and no mass, fullness, tenderness               Rectovaginal: Confirms               Anus:  normal sphincter tone, no lesions  A:  Well Woman with normal exam, no HRT  Osteopenia.  Declines BMD right now. Colonic polyps.  Recent rectal bleeding after diarrhea episode.  Resolved.  If returns, she will call to see Dr.  Olevia Perches.  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  P: Mammogram yearly CMP, CBC, TSH, Vit D, Lipids No paps due to h/o TAH return annually or prn   An After Visit Summary was printed and given to the patient.

## 2014-05-24 NOTE — Patient Instructions (Signed)

## 2014-05-28 ENCOUNTER — Other Ambulatory Visit (INDEPENDENT_AMBULATORY_CARE_PROVIDER_SITE_OTHER): Payer: Managed Care, Other (non HMO)

## 2014-05-28 DIAGNOSIS — Z0000 Encounter for general adult medical examination without abnormal findings: Secondary | ICD-10-CM

## 2014-05-28 LAB — CBC
HCT: 39.7 % (ref 36.0–46.0)
Hemoglobin: 13.5 g/dL (ref 12.0–15.0)
MCH: 30.5 pg (ref 26.0–34.0)
MCHC: 34 g/dL (ref 30.0–36.0)
MCV: 89.8 fL (ref 78.0–100.0)
Platelets: 258 10*3/uL (ref 150–400)
RBC: 4.42 MIL/uL (ref 3.87–5.11)
RDW: 14 % (ref 11.5–15.5)
WBC: 4.5 10*3/uL (ref 4.0–10.5)

## 2014-05-28 LAB — COMPREHENSIVE METABOLIC PANEL
ALT: 16 U/L (ref 0–35)
AST: 15 U/L (ref 0–37)
Albumin: 4.3 g/dL (ref 3.5–5.2)
Alkaline Phosphatase: 60 U/L (ref 39–117)
BUN: 19 mg/dL (ref 6–23)
CO2: 29 mEq/L (ref 19–32)
Calcium: 9.1 mg/dL (ref 8.4–10.5)
Chloride: 105 mEq/L (ref 96–112)
Creat: 0.75 mg/dL (ref 0.50–1.10)
Glucose, Bld: 84 mg/dL (ref 70–99)
Potassium: 4.4 mEq/L (ref 3.5–5.3)
SODIUM: 141 meq/L (ref 135–145)
Total Bilirubin: 0.5 mg/dL (ref 0.2–1.2)
Total Protein: 6 g/dL (ref 6.0–8.3)

## 2014-05-28 LAB — LIPID PANEL
Cholesterol: 190 mg/dL (ref 0–200)
HDL: 58 mg/dL (ref >39)
LDL Cholesterol: 119 mg/dL — ABNORMAL HIGH (ref 0–99)
Total CHOL/HDL Ratio: 3.3 Ratio
Triglycerides: 66 mg/dL (ref <150)
VLDL: 13 mg/dL (ref 0–40)

## 2014-05-28 LAB — TSH: TSH: 1.397 u[IU]/mL (ref 0.350–4.500)

## 2014-05-29 LAB — VITAMIN D 25 HYDROXY (VIT D DEFICIENCY, FRACTURES): Vit D, 25-Hydroxy: 33 ng/mL (ref 30–89)

## 2014-09-30 ENCOUNTER — Encounter: Payer: Self-pay | Admitting: Obstetrics & Gynecology

## 2015-03-05 ENCOUNTER — Ambulatory Visit (INDEPENDENT_AMBULATORY_CARE_PROVIDER_SITE_OTHER): Payer: Managed Care, Other (non HMO) | Admitting: Sports Medicine

## 2015-03-05 ENCOUNTER — Encounter: Payer: Self-pay | Admitting: Sports Medicine

## 2015-03-05 VITALS — BP 144/83 | Ht 65.0 in | Wt 130.0 lb

## 2015-03-05 DIAGNOSIS — M545 Low back pain, unspecified: Secondary | ICD-10-CM

## 2015-03-05 DIAGNOSIS — M9905 Segmental and somatic dysfunction of pelvic region: Secondary | ICD-10-CM | POA: Diagnosis not present

## 2015-03-05 MED ORDER — CYCLOBENZAPRINE HCL 10 MG PO TABS: 10.0000 mg | ORAL_TABLET | Freq: Every day | ORAL | Status: DC

## 2015-03-05 NOTE — Patient Instructions (Signed)
2 Aleeve twice per day for 3-4 days then as needed. Refer to Dr. Belia Heman

## 2015-03-05 NOTE — Progress Notes (Signed)
Leslie Reed - 69 y.o. female MRN [id number]  Date of birth: 1946/02/16  SUBJECTIVE: CC: 1.  low back pain, reevaluation      HPI:   5 days of worsening diffuse low back pain that does not radiate  Prior issues with SI joint dysfunction, seen by Dr. Belia Heman  Has been out working in her yard  & spreading pine needles, cutting trees and doing 8 hours manual labor once weekly.  Pain significantly worsened 4 days ago. Most comfortable position is in hip flexion  Pain is diffusely across the low back, localizes more to the left SI.  Denies fevers, chills, recent weight gain or weight loss.  Has not taken any medications      ROS:  per HPI    HISTORY:  Past Medical, Surgical, Social, and Family History reviewed & updated per EMR.  Pertinent Historical Findings include: History  Substance Use Topics  . Smoking status: Never Smoker   . Smokeless tobacco: Never Used  . Alcohol Use: Yes     Comment: 3 or so time a week-wine or beer   Problem  Low Back Pain   Probable myofascial origin X-rays 2006: Degenerative changes of facets at L4-L5 and L5-S1    OBJECTIVE:  VS:   HT:5\' 5"  (165.1 cm)   WT:130 lb (58.968 kg)  BMI:21.7          BP:(!) 144/83 mmHg  HR: bpm  TEMP: ( )  RESP:   PHYSICAL EXAM: GENERAL: Adult, Caucasian female. No acute distress PSYCH: Alert and appropriately interactive. SKIN: No open skin lesions or abnormal skin markings on areas inspected as below VASCULAR: DP and PT pulses 2+/4, no pretibial edema NEURO: Lower extremity strength is 5+/5 in all myotomes; sensation is intact to light touch in all dermatomes. BACK: Overall well aligned, loss of lumbar lordosis. No significant scoliosis. TTP over left paraspinal musculature and left SI joint. No midline tenderness. Positive ASIS compression test on left, symmetric leg lengths. Left anterior innominate.   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 Does not seem to be neurogenic. 1. OTC 2 Aleve twice a day 2. Flexeril daily at bedtime 3. Refer to Dr. Belia Heman for physical therapy 4. HEP: Back handout, iliopsoas stretching, cautioned on abrupt hip extension as this may worsen spasm > If no significant improvement will follow-up in 3-4 weeks, consider repeat x-rays versus further advanced imaging if any new or different neurogenic symptoms  PROCEDURE NOTE : Osteopathic Manipulation. The decision today to treat with OMT was based on physical exam. Verbal consent was obtained after after explanation of risks, benefits and potential side effects, including acute pain flare, post manipulation soreness and need for repeat treatments.             Regions treated:  Pelvis          Techniques used:  Position of ease, myofascial release A patient tolerated procedure well with reported improvement in symptoms. Patient given medications, exercises, stretches and lifestyle modifications per AVS and verbally.         Relevant Medications   cyclobenzaprine (FLEXERIL) tablet    Other Visit Diagnoses    Somatic dysfunction of pelvis region  FOLLOW UP:  Return if symptoms worsen or fail to improve.

## 2015-03-05 NOTE — Assessment & Plan Note (Signed)
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 Does not seem to be neurogenic. 1. OTC 2 Aleve twice a day 2. Flexeril daily at bedtime 3. Refer to Dr. Belia Heman for physical therapy 4. HEP: Back handout, iliopsoas stretching, cautioned on abrupt hip extension as this may worsen spasm > If no significant improvement will follow-up in 3-4 weeks, consider repeat x-rays versus further advanced imaging if any new or different neurogenic symptoms  PROCEDURE NOTE : Osteopathic Manipulation. The decision today to treat with OMT was based on physical exam. Verbal consent was obtained after after explanation of risks, benefits and potential side effects, including acute pain flare, post manipulation soreness and need for repeat treatments.             Regions treated:  Pelvis          Techniques used:  Position of ease, myofascial release A patient tolerated procedure well with reported improvement in symptoms. Patient given medications, exercises, stretches and lifestyle modifications per AVS and verbally.

## 2015-04-03 ENCOUNTER — Other Ambulatory Visit: Payer: Self-pay | Admitting: *Deleted

## 2015-04-03 ENCOUNTER — Telehealth: Payer: Self-pay | Admitting: *Deleted

## 2015-04-03 MED ORDER — DOXYCYCLINE MONOHYDRATE 100 MG PO TABS: 100.0000 mg | ORAL_TABLET | Freq: Two times a day (BID) | ORAL | Status: DC

## 2015-04-03 NOTE — Telephone Encounter (Signed)
Area of bite was in the hair with no signs or symptoms at the moment. Suggested using OTC topical triple abx for 2 days. If things got worse, she can start doxy 100 bid for 7d which i called in. Pt to call in sxs appear of redness, fever, or hotness to the area where bite occurred. Pt agreed.

## 2015-06-09 ENCOUNTER — Encounter: Payer: Self-pay | Admitting: Sports Medicine

## 2015-06-09 ENCOUNTER — Ambulatory Visit (INDEPENDENT_AMBULATORY_CARE_PROVIDER_SITE_OTHER): Payer: Managed Care, Other (non HMO) | Admitting: Sports Medicine

## 2015-06-09 ENCOUNTER — Other Ambulatory Visit: Payer: Self-pay | Admitting: *Deleted

## 2015-06-09 ENCOUNTER — Ambulatory Visit
Admission: RE | Admit: 2015-06-09 | Discharge: 2015-06-09 | Disposition: A | Discharge status: Home / Self Care | Payer: Managed Care, Other (non HMO) | Source: Ambulatory Visit | Attending: Sports Medicine | Admitting: Sports Medicine

## 2015-06-09 VITALS — BP 140/80 | Ht 65.0 in | Wt 136.0 lb

## 2015-06-09 DIAGNOSIS — R0781 Pleurodynia: Secondary | ICD-10-CM

## 2015-06-09 MED ORDER — DICLOFENAC SODIUM 75 MG PO TBEC: DELAYED_RELEASE_TABLET | ORAL | Status: DC

## 2015-06-09 NOTE — Progress Notes (Signed)
Patient ID: Leslie Reed, female   DOB: 03-06-1946, 69 y.o.   MRN: [id number]  CC: Rib Pain  HPI: Leslie Reed is a 69 y.o. female presenting to clinic with complaints of right sided rib pain.  Patient states that on Tuesday morning she was outside trying to sort her trash before the trash services people came. She was bending over into her plastic garbage container and thrust herself down so as to reach what was at the bottom of the trash can. She said that when she performed that move she felt like the edge of the trash can slipped between her ribs. She has tried icing the area and wrapping it without much relief. Denies noticeable bruising to area or swelling. Patient states pain was tolerable and she was trying to avoid coming to doctor but over the weekend pain worsened. She has not tried any medications for pain relief. Pain worse with coughing, sneezing, raising arm above head, and breathing. It is hard for her to find a comfortable position. 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 Denies any fevers.   ROS per HPI  Physical Exam: BP 140/80 mmHg  Ht 5\' 5"  (1.651 m)  Wt 136 lb (61.689 kg)  BMI 22.63 kg/m2  LMP 11/29/1986  General: NAD, well-appearing, alert and cooperative MSK: No redness, warmth, or swelling over right ribs. Tender to palpation over ribs 6-7 on right. No appreciable step off.  Skin: Intact without suspicious lesions or rashes. Warm and dry. No bruising Psych: Mood and affect are normal.   Assessment/Plan: A: Differential includes rib fracture vs costal cartilage injury. Mechanism of action does not sound indicative of rib fracture but cannot rule out.   P:  -Will order rib films with marker and follow-up with patient on read -Patient given rib belt to help with symptom relief -She declines medications at this time but encouraged prn use of APAP as needed -Limit activity based on symptoms -Counseled patient on possible extending  healing time of rib injury -Follow-up prn if symptoms not improved in 3-4 weeks.    Luiz Blare, DO 06/09/2015, 9:30 AM PGY-2, [place]  Patient seen and examined with the above resident. I agree with the plan of care. I personally reviewed her x-rays and I see no evidence of fracture. We will notify the patient. She will continue with her rib belt as needed for comfort and will follow-up in 3-4 weeks if symptoms persist.

## 2015-06-10 ENCOUNTER — Ambulatory Visit: Payer: Managed Care, Other (non HMO) | Admitting: Sports Medicine

## 2015-06-26 ENCOUNTER — Encounter: Payer: Self-pay | Admitting: Obstetrics & Gynecology

## 2015-06-26 ENCOUNTER — Ambulatory Visit: Payer: Managed Care, Other (non HMO) | Admitting: Obstetrics & Gynecology

## 2015-07-01 ENCOUNTER — Encounter: Payer: Self-pay | Admitting: Obstetrics & Gynecology

## 2015-07-01 ENCOUNTER — Ambulatory Visit (INDEPENDENT_AMBULATORY_CARE_PROVIDER_SITE_OTHER): Payer: Managed Care, Other (non HMO) | Admitting: Obstetrics & Gynecology

## 2015-07-01 VITALS — BP 108/60 | HR 68 | Resp 16 | Ht 64.0 in | Wt 136.0 lb

## 2015-07-01 DIAGNOSIS — Z0000 Encounter for general adult medical examination without abnormal findings: Secondary | ICD-10-CM

## 2015-07-01 DIAGNOSIS — Z01419 Encounter for gynecological examination (general) (routine) without abnormal findings: Secondary | ICD-10-CM | POA: Diagnosis not present

## 2015-07-01 LAB — POCT URINALYSIS DIPSTICK
BILIRUBIN UA: NEGATIVE
GLUCOSE UA: NEGATIVE
Ketones, UA: NEGATIVE
LEUKOCYTES UA: NEGATIVE
Nitrite, UA: NEGATIVE
PROTEIN UA: NEGATIVE
Urobilinogen, UA: NEGATIVE
pH, UA: 5

## 2015-07-01 LAB — TSH: TSH: 0.994 u[IU]/mL (ref 0.350–4.500)

## 2015-07-01 LAB — COMPREHENSIVE METABOLIC PANEL
ALK PHOS: 53 U/L (ref 33–130)
ALT: 16 U/L (ref 6–29)
AST: 18 U/L (ref 10–35)
Albumin: 4.2 g/dL (ref 3.6–5.1)
BILIRUBIN TOTAL: 0.5 mg/dL (ref 0.2–1.2)
BUN: 18 mg/dL (ref 7–25)
CALCIUM: 9.2 mg/dL (ref 8.6–10.4)
CO2: 26 mmol/L (ref 20–31)
CREATININE: 0.66 mg/dL (ref 0.50–0.99)
Chloride: 104 mmol/L (ref 98–110)
GLUCOSE: 88 mg/dL (ref 65–99)
Potassium: 4.3 mmol/L (ref 3.5–5.3)
SODIUM: 144 mmol/L (ref 135–146)
Total Protein: 6.1 g/dL (ref 6.1–8.1)

## 2015-07-01 LAB — LIPID PANEL
Cholesterol: 187 mg/dL (ref 125–200)
HDL: 53 mg/dL (ref >=46)
LDL CALC: 106 mg/dL (ref <130)
Total CHOL/HDL Ratio: 3.5 Ratio (ref <=5.0)
Triglycerides: 139 mg/dL (ref <150)
VLDL: 28 mg/dL (ref <30)

## 2015-07-01 NOTE — Addendum Note (Signed)
Addended by: Megan Salon on: 07/01/2015 02:19 PM   Modules accepted: Miquel Dunn

## 2015-07-01 NOTE — Patient Instructions (Signed)
Dr. Carl Gessner, [facility] 

## 2015-07-01 NOTE — Progress Notes (Addendum)
[name] here for annual exam.  Doing well.  No vaginal bleeding.  Having her 50th high school reunion.    Did have some low back pain issues earlier this year.  Saw PT but this has improved.  Then have a bruised rib around the 4th of July.  Still wrapping chest but pain is improving.    Patient's last menstrual period was 11/29/1986.          Sexually active: Yes.    The current method of family planning is status post hysterectomy.    Exercising: Yes.    work-yard work, Education administrator houses Smoker:  no  Health Maintenance: Pap:  11/11/08-WNL History of abnormal Pap:  no MMG:  07/03/14 3D-BiRads 1 normal Colonoscopy:  1/12-repeat in 5 years Dr Olevia Perches BMD:   06/05/12-stable osteopenia in hip TDaP:  04/18/13 Screening Labs: today, Hb today: 12.5, Urine today: RBC-trace   reports that she has never smoked. She has never used smokeless tobacco. She reports that she drinks alcohol. She reports that she does not use illicit drugs.  Past Medical History  Diagnosis Date  . Osteopenia   . Hx of varicella   . Diverticulitis   . History of migraine headaches   . Hx of colonic polyp   . Arthritis   . 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 Mitral valve prolapse   . Colitis 8/04  . Scoliosis 2005  . Tick bite 2014    STARI-antibiotics x 5 weeks    Past Surgical History  Procedure Laterality Date  . Abdominal hysterectomy      partial  for pain no cancer    . Colon polyps    . Cesarean section      x4  . Cholecystectomy    . Appendectomy    . Breast biopsy      x 2   . Dilation and curettage of uterus      Current Outpatient Prescriptions  Medication Sig Dispense Refill  . pseudoephedrine-acetaminophen (TYLENOL SINUS) 30-500 MG TABS Take 1 tablet by mouth as needed.     No current facility-administered medications for this visit.    Family History  Problem Relation Age of Onset  . Diabetes Mother   . Pulmonary fibrosis Mother     45 deceased  . Hypertension  Father   . Alzheimer's disease Father   . Kidney failure Father   . Diabetes Maternal Grandmother   . Cancer Maternal Grandfather   . Hypertension Mother     ROS:  Pertinent items are noted in HPI.  Otherwise, a comprehensive ROS was negative.  Exam:   BP 108/60 mmHg  Pulse 68  Resp 16  Ht 5\' 4"  (1.626 m)  Wt 136 lb (61.689 kg)  BMI 23.33 kg/m2  LMP 11/29/1986   Height: 5\' 4"  (162.6 cm)  Ht Readings from Last 3 Encounters:  07/01/15 5\' 4"  (1.626 m)  06/09/15 5\' 5"  (1.651 m)  03/05/15 5\' 5"  (1.651 m)    General appearance: alert, cooperative and appears stated age Head: Normocephalic, without obvious abnormality, atraumatic Neck: no adenopathy, supple, symmetrical, trachea midline and thyroid normal to inspection and palpation Lungs: clear to auscultation bilaterally Breasts: normal appearance, no masses or tenderness Heart: regular rate and rhythm Abdomen: soft, non-tender; bowel sounds normal; no masses,  no organomegaly Extremities: extremities normal, atraumatic, no cyanosis or edema Skin: Skin color, texture, turgor normal. No rashes or lesions Lymph nodes: Cervical, supraclavicular, and axillary nodes normal. No abnormal inguinal nodes palpated Neurologic:  Grossly normal   Pelvic: External genitalia:  no lesions              Urethra:  normal appearing urethra with no masses, tenderness or lesions              Bartholins and Skenes: normal                 Vagina: normal appearing vagina with normal color and discharge, no lesions              Cervix: absent              Pap taken: No. Bimanual Exam:  Uterus:  uterus absent              Adnexa: no mass, fullness, tenderness               Rectovaginal: Confirms               Anus:  normal sphincter tone, no lesions  Chaperone was present for exam.  A:  Well Woman with normal exam, no HRT  Osteopenia Colonic polyps.  Has follow up due next January.   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  P: Mammogram yearly CMP, TSH, Vit D, Lipids.  Will advise if pt needs any supplement Vit D. No paps due to h/o TAH Plan BMD around 2018.  Pt, currently, declines any treatment Pt will get the pneumovax this year.  Rx given. Return annually or prn

## 2015-07-02 ENCOUNTER — Telehealth: Payer: Self-pay

## 2015-07-02 LAB — HEMOGLOBIN, FINGERSTICK: HEMOGLOBIN, FINGERSTICK: 12.5 g/dL (ref 12.0–16.0)

## 2015-07-02 LAB — VITAMIN D 25 HYDROXY (VIT D DEFICIENCY, FRACTURES): Vit D, 25-Hydroxy: 27 ng/mL — ABNORMAL LOW (ref 30–100)

## 2015-07-02 MED ORDER — VITAMIN D (ERGOCALCIFEROL) 1.25 MG (50000 UNIT) PO CAPS: ORAL_CAPSULE | ORAL | Status: DC

## 2015-07-02 NOTE — Telephone Encounter (Signed)
-----   Message from Megan Salon, MD sent at 07/02/2015  3:00 PM EDT ----- Inform pt CMP normal.  Lipids normal.  TSH normal.  Vit D 27.  Goal is 40.  Can either take 50K every two weeks or use 2000 IU daily.  Will repeat at [facility] next year.  I'm sending to you to call instead of Claiborne Billings as pt is going out of town.  Thanks.

## 2015-07-02 NOTE — Telephone Encounter (Signed)
Spoke with patient. Advised of results as seen below from Jamul. Patient is agreeable and verbalizes understanding. Would like to start rx for Vitamin D at this time. Rx for Vitamin D 50,000 IU every 2 weeks #6 #3RF sent to pharmacy on file. Patient is agreeable and verbalizes understanding.  Routing to provider for final review. Patient agreeable to disposition. Will close encounter.   Patient aware provider will review message and nurse will return call if any additional advice or change of disposition.

## 2015-08-22 ENCOUNTER — Encounter: Payer: Self-pay | Admitting: Family Medicine

## 2015-08-22 ENCOUNTER — Ambulatory Visit (INDEPENDENT_AMBULATORY_CARE_PROVIDER_SITE_OTHER): Payer: 59 | Admitting: Family Medicine

## 2015-08-22 VITALS — BP 119/68 | HR 70 | Ht 64.0 in | Wt 136.0 lb

## 2015-08-22 DIAGNOSIS — M79645 Pain in left finger(s): Secondary | ICD-10-CM | POA: Diagnosis not present

## 2015-08-24 NOTE — Progress Notes (Signed)
   Subjective:    Patient ID: Leslie Reed, female    DOB: 09-26-1946, 69 y.o.   MRN: [id number]  HPI Patient had acute onset of pain and swelling in her left ring finger this morning.  She was not doing any activity with her hands, was just walking.  Pain was intense and sharp and then the finger started to swell and become stiff.  She also noted a slight color change, darker.  Since then the pain has improved slightly in the finger is still stiff but hurting less.  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  Has never had anything happen like this before.  She is right-hand dominant.   Review of Systems No unusual weight change, fever, sweats, chills.  She's noted no rash.  She's had no specific unusual joint pains or swelling.    Objective:   Physical Exam Vital signs reviewed GENERAL: Well-developed female in distress and HANDS: She has multiple DIP joints that are deformed secondary to osteoarthritis.  The left fourth ring finger appears slightly swollen but she has essentially full range of motion flexion and extension.  She has normal flexion and extension at the PIP and DIP joint. SKIN: Slight erythema of the joint that is very mild.  The skin is normal in temperature, normal cap refill. ULTRASOUND: The flexor and extensor tendons are normal and there is no sign of any edema around the tendon or in the tendon sheath.      Assessment & Plan:  Acute finger pain that started about half hour prior to her office visit today.  He is already improving slightly.  The only diagnosis I can come up with his to same when she had which is small rupture of a blood vessel.  Why this would occur outside the setting of trauma I'm not sure.  I would just have her ice for pain relief and swelling relief.  I think she can continue to do any activity that she is able to do.  She'll let us know if it does not resolve  completely in the next 3-4 days.  She is reassured.

## 2016-01-05 ENCOUNTER — Encounter: Payer: Self-pay | Admitting: Gastroenterology

## 2016-02-03 ENCOUNTER — Other Ambulatory Visit: Payer: Self-pay | Admitting: Obstetrics & Gynecology

## 2016-02-03 NOTE — Telephone Encounter (Signed)
She was given enough refills on the vit D to last her the year. Please make sure she is taking it correctly an figure out what happened with her refills.  Thanks!

## 2016-02-03 NOTE — Telephone Encounter (Signed)
Medication refill request: Vit D Last AEX:  07/01/15 SM Next AEX: 10/05/16 SM Last MMG (if hormonal medication request): 07/29/15 [id number]:Neg Refill authorized: 07/02/15 #6/3R. Today please advise.   Routed to Dr. Talbert Nan

## 2016-02-04 NOTE — Telephone Encounter (Signed)
Spoke with patient. Patient states that she went to pick her refill up at the pharmacy and was advised she did not have any further refills. Pharmacy advised her there were two prescriptions for Vitamin D on file for her. The pharmacy has now filled her prescription for her. Patient states she has refills remaining until August 2017.  I am unable to close the encounter as there is a pending medication.

## 2016-05-03 ENCOUNTER — Ambulatory Visit (INDEPENDENT_AMBULATORY_CARE_PROVIDER_SITE_OTHER): Payer: 59 | Admitting: Family Medicine

## 2016-05-03 ENCOUNTER — Ambulatory Visit (INDEPENDENT_AMBULATORY_CARE_PROVIDER_SITE_OTHER)
Admission: RE | Admit: 2016-05-03 | Discharge: 2016-05-03 | Disposition: A | Discharge status: Home / Self Care | Payer: 59 | Source: Ambulatory Visit | Attending: Family Medicine | Admitting: Family Medicine

## 2016-05-03 ENCOUNTER — Encounter: Payer: Self-pay | Admitting: Family Medicine

## 2016-05-03 ENCOUNTER — Other Ambulatory Visit: Payer: Self-pay | Admitting: *Deleted

## 2016-05-03 VITALS — BP 110/68 | HR 78 | Temp 98.0°F | Ht 64.0 in | Wt 138.5 lb

## 2016-05-03 DIAGNOSIS — J988 Other specified respiratory disorders: Secondary | ICD-10-CM

## 2016-05-03 DIAGNOSIS — R05 Cough: Secondary | ICD-10-CM | POA: Diagnosis not present

## 2016-05-03 DIAGNOSIS — R059 Cough, unspecified: Secondary | ICD-10-CM

## 2016-05-03 MED ORDER — ALBUTEROL SULFATE HFA 108 (90 BASE) MCG/ACT IN AERS: 2.0000 | INHALATION_SPRAY | Freq: Four times a day (QID) | RESPIRATORY_TRACT | Status: DC | PRN

## 2016-05-03 MED ORDER — DOXYCYCLINE HYCLATE 100 MG PO TABS: 100.0000 mg | ORAL_TABLET | Freq: Two times a day (BID) | ORAL | Status: DC

## 2016-05-03 MED ORDER — PREDNISONE 20 MG PO TABS: 40.0000 mg | ORAL_TABLET | Freq: Every day | ORAL | Status: DC

## 2016-05-03 MED ORDER — BENZONATATE 100 MG PO CAPS: 100.0000 mg | ORAL_CAPSULE | Freq: Three times a day (TID) | ORAL | Status: DC | PRN

## 2016-05-03 NOTE — Progress Notes (Signed)
Pre visit review using our clinic review tool, if applicable. No additional management support is needed unless otherwise documented below in the visit note. 

## 2016-05-03 NOTE — Patient Instructions (Addendum)
BEFORE YOU LEAVE: -xray sheet  Start the antibiotic. Stay out of the sun.  Get the xray.   Follow up if worsening or does not resolve with treatment

## 2016-05-03 NOTE — Progress Notes (Signed)
HPI:  Cough: -started: a few weeks ago -symptoms:nasal congestion, sore throat, cough - yellow thick, laryngitis, chest tightness is actually better with activity, tooth pain -denies:fever, SOB, NVD -has tried: vics, musinex -sick contacts/travel/risks: no reported flu, strep or tick exposure -Hx of: allergies, chronic sinus issues and cough - uses vics; she gets occ bronchitis with illness; hx recurrent sinus infections in the past  ROS: See pertinent positives and negatives per HPI.  Past Medical History  Diagnosis Date  . Osteopenia   . Hx of varicella   . Diverticulitis   . History of migraine headaches   . Hx of colonic polyp   . Arthritis   . 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 Mitral valve prolapse   . Colitis 8/04  . Scoliosis 2005  . Tick bite 2014    STARI-antibiotics x 5 weeks    Past Surgical History  Procedure Laterality Date  . Abdominal hysterectomy      partial  for pain no cancer    . Colon polyps    . Cesarean section      x4  . Cholecystectomy    . Appendectomy    . Breast biopsy      x 2   . Dilation and curettage of uterus      Family History  Problem Relation Age of Onset  . Diabetes Mother   . Pulmonary fibrosis Mother     61 deceased  . Hypertension Father   . Alzheimer's disease Father   . Kidney failure Father   . Diabetes Maternal Grandmother   . Cancer Maternal Grandfather     ??  . Hypertension Mother     Social History   Social History  . Marital Status: Married    Spouse Name: N/A  . Number of Children: N/A  . Years of Education: N/A   Social History Main Topics  . Smoking status: Never Smoker   . Smokeless tobacco: Never Used  . Alcohol Use: Yes     Comment: 3 or so time a week-wine or beer  . Drug Use: No  . Sexual Activity:    Partners: Male    Birth Control/ Protection: Surgical     Comment: TAH   Other Topics Concern  . 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  pseudoephedrine-acetaminophen (TYLENOL SINUS) 30-500 MG TABS, Take 1 tablet by mouth as needed., Disp: , Rfl:  .  Vitamin D, Ergocalciferol, (DRISDOL) 50000 UNITS CAPS capsule, Take one capsule by mouth every 2 weeks (14 days)., Disp: 6 capsule, Rfl: 3 .  benzonatate (TESSALON PERLES) 100 MG capsule, Take 1 capsule (100 mg total) by mouth 3 (three) times daily as needed., Disp: 20 capsule, Rfl: 0 .  doxycycline (VIBRA-TABS) 100 MG tablet, Take 1 tablet (100 mg total) by mouth 2 (two) times daily., Disp: 20 tablet, Rfl: 0  EXAM:  Filed Vitals:   05/03/16 0943  BP: 110/68  Pulse: 78  Temp: 98 F (36.7 C)    Body mass index is 23.76 kg/(m^2).  GENERAL: vitals reviewed and listed above, alert, oriented, appears well hydrated and in no acute distress  HEENT: atraumatic, conjunttiva clear,  no obvious abnormalities on inspection of external nose and ears, normal appearance of ear canals and TMs, clear nasal congestion, mild post oropharyngeal erythema with PND, no tonsillar edema or exudate, no sinus TTP  NECK: no obvious masses on inspection  LUNGS: clear to auscultation bilaterally, no wheezes, rales or rhonchi, good air movement except for ? Rhonchi R base  CV: HRRR, SEM at RSB, II/VII, no peripheral edema  MS: moves all extremities without noticeable abnormality  PSYCH: pleasant and cooperative, no obvious depression or anxiety  ASSESSMENT AND PLAN:  Discussed the following assessment and plan:  Cough - Plan: doxycycline (VIBRA-TABS) 100 MG tablet, DG Chest 2 View  Respiratory infection - Plan: doxycycline (VIBRA-TABS) 100 MG tablet, DG Chest 2 View  We discussed potential etiologies, with resp infection being most likely, possible CAP. We opted to treat with doxy for good sinus and lung coverage,  cxr pending.  We discussed treatment side effects, likely course, potential complications, return precautions, transmission, and signs of developing a serious illness. -of course, we advised to return or notify a doctor immediately if symptoms worsen or persist or new concerns arise.    Patient Instructions  BEFORE YOU LEAVE: -xray sheet  Start the antibiotic. Stay out of the sun.  Get the xray.   Follow up if worsening or does not resolve with treatment       Nester Bachus R.

## 2016-05-03 NOTE — Telephone Encounter (Signed)
Rx done-see results note. 

## 2016-05-21 ENCOUNTER — Telehealth: Payer: Self-pay | Admitting: Internal Medicine

## 2016-05-21 NOTE — Telephone Encounter (Signed)
[facility]  Patient Name: Leslie Reed  DOB: 1945/12/29    Initial Comment Caller states saw Dr on June 5th for deep chest congestion, prescribed different medicine, still has deep congestion in her chest along with pressure, coughing and starting to get laryngitis again.    Nurse Assessment  Nurse: Wayne Sever, RN, Tillie Rung Date/Time (Eastern Time): 05/21/2016 1:33:02 PM  Confirm and document reason for call. If symptomatic, describe symptoms. You must click the next button to save text entered. ---Caller states she is having congestion in her chest. She states she saw the doctor on 06/05 and has finished her antibiotics and the congestion is now coming back. She did not fill the inhaler that was prescribed due to cough. Denies any fever.  Has the patient traveled out of the country within the last 30 days? ---Not Applicable  Does the patient have any new or worsening symptoms? ---Yes  Will a triage be completed? ---Yes  Related visit to physician within the last 2 weeks? ---No  Does the PT have any chronic conditions? (i.e. diabetes, asthma, etc.) ---No  Is this a behavioral health or substance abuse call? ---No     Guidelines    Guideline Title Affirmed Question Affirmed Notes  Cough - Acute Productive Cough has been present for > 3 weeks    Final Disposition User   See PCP When Office is Open (within 3 days) Wayne Sever, RN, Tillie Rung    Comments  Scheduled with Dr Regis Bill on 06/26 at 945am   Referrals  REFERRED TO PCP OFFICE   Disagree/Comply: Comply

## 2016-05-24 ENCOUNTER — Encounter: Payer: Self-pay | Admitting: Internal Medicine

## 2016-05-24 ENCOUNTER — Ambulatory Visit (INDEPENDENT_AMBULATORY_CARE_PROVIDER_SITE_OTHER): Payer: 59 | Admitting: Internal Medicine

## 2016-05-24 VITALS — BP 128/80 | HR 71 | Temp 98.1°F | Ht 64.0 in | Wt 140.1 lb

## 2016-05-24 DIAGNOSIS — R938 Abnormal findings on diagnostic imaging of other specified body structures: Secondary | ICD-10-CM

## 2016-05-24 DIAGNOSIS — R9389 Abnormal findings on diagnostic imaging of other specified body structures: Secondary | ICD-10-CM

## 2016-05-24 DIAGNOSIS — G4700 Insomnia, unspecified: Secondary | ICD-10-CM

## 2016-05-24 DIAGNOSIS — G479 Sleep disorder, unspecified: Secondary | ICD-10-CM | POA: Diagnosis not present

## 2016-05-24 DIAGNOSIS — R05 Cough: Secondary | ICD-10-CM | POA: Diagnosis not present

## 2016-05-24 DIAGNOSIS — R053 Chronic cough: Secondary | ICD-10-CM

## 2016-05-24 DIAGNOSIS — R413 Other amnesia: Secondary | ICD-10-CM | POA: Diagnosis not present

## 2016-05-24 MED ORDER — ALBUTEROL SULFATE HFA 108 (90 BASE) MCG/ACT IN AERS: 2.0000 | INHALATION_SPRAY | Freq: Four times a day (QID) | RESPIRATORY_TRACT | Status: DC | PRN

## 2016-05-24 MED ORDER — MONTELUKAST SODIUM 10 MG PO TABS: 10.0000 mg | ORAL_TABLET | Freq: Every day | ORAL | Status: DC

## 2016-05-24 NOTE — Patient Instructions (Addendum)
   I advise     Getting lung function  tests and consider   Seeing pulmonary sx .  You will be contacted about a time.  Try a different  albuterol bronchodilator  For now to see if it is less expensive.  nasal cotisone   Every day for at least 3 weeks for the chronic sinus sx .   Can try adding singulair  Since  You have hx of positive allergy testing  Follow-up appointment in 3-4 weeks after all of above done or if worse.  Consideration of getting a pulmonary consult if ongoing issues. .   Get back with dr Dohmier   Call if need referral

## 2016-05-24 NOTE — Progress Notes (Signed)
Pre visit review using our clinic review tool, if applicable. No additional management support is needed unless otherwise documented below in the visit note.  Chief Complaint  Patient presents with  . Cough    HPI:  Leslie Reed 70 y.o.  Comes in for sda  appt  She has had an ongoing respiratory symptoms for a month and saw Dr. Ninfa Meeker. She has some chronic postnasal drainage in a.m. phlegm and what she calls chronic sinus symptoms. However there was a worsening without associated fever but exposure to pneumonia. She was given antibiotics.  Onset of antibiotics   And pred On June 5th    rx for doxy   June 5th   For 3-4 days  she states she felt some better  . Now itr is starrting to come back   What to do  About the sx .  ?   Has hx of sinus sx  never smoked.  Hx of ?  asthma and gerd    Didn't take the allergic injections when she was tested years ago was allergic to many things. She is not taking Flonase as she states that for her nasal congestion at night and face pain  Says vicks works better  .   Reacted to many thinks     Testing in back .  She was given albuterol the last visit but didn't get it filled because it was $80. Coughing  In am   no shortness of breath or wheezing or persistent symptoms.   She also brings up the fact she still having a hard time with sleep. Within over-the-counter medicine help melatonin may have given her headache. She falls asleep with the light on reading the newspaper which falls over her face she is able to fall asleep but then wakes up in 2-3 hours. Never gets a full night sleep. She's been doing this for years. In the past when she turned the lights off her mind would race. Her son has sleep apnea and he is in his 24s. She remembers gasping for breath occasionally but no other obstructive symptoms.  She does have IBS.   She thinks that her memory is getting worse uncertain if it's from sleep. Deprivation interruption. Has had previous  initial evaluation by Dr. Roddie Mc. No follow-up. This was about 3 or 4 years ago.    ROS: See pertinent positives and negatives per HPI.  Past Medical History  Diagnosis Date  . Osteopenia   . Hx of varicella   . Diverticulitis   . History of migraine headaches   . Hx of colonic polyp   . Arthritis   . 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 Mitral valve prolapse   . Colitis 8/04  . Scoliosis 2005  . Tick bite 2014    STARI-antibiotics x 5 weeks    Family History  Problem Relation Age of Onset  . Diabetes Mother   . Pulmonary fibrosis Mother     38 deceased  . Hypertension Father   . Alzheimer's disease Father   . Kidney failure Father   . Diabetes Maternal Grandmother   . Cancer Maternal Grandfather     ??  . Hypertension Mother     Social History   Social History  . Marital Status: Married    Spouse Name: N/A  . Number of Children: N/A  . Years of Education: N/A   Social History Main Topics  . Smoking status: Never Smoker   . Smokeless tobacco: Never Used  .  Alcohol Use: Yes     Comment: 3 or so time a week-wine or beer  . Drug Use: No  . Sexual Activity:    Partners: Male    Birth Control/ Protection: Surgical     Comment: TAH   Other Topics Concern  . 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 pseudoephedrine-acetaminophen (TYLENOL SINUS) 30-500 MG TABS Take 1 tablet by mouth as needed.    . Vitamin D, Ergocalciferol, (DRISDOL) 50000 UNITS CAPS capsule Take one capsule by mouth every 2 weeks (14 days). 6 capsule 3  . albuterol (PROVENTIL HFA;VENTOLIN HFA) 108 (90 Base) MCG/ACT inhaler Inhale 2 puffs into the lungs every 6 (six) hours as needed for wheezing or shortness of breath. 1 Inhaler 0  .  benzonatate (TESSALON PERLES) 100 MG capsule Take 1 capsule (100 mg total) by mouth 3 (three) times daily as needed. 20 capsule 0  . doxycycline (VIBRA-TABS) 100 MG tablet Take 1 tablet (100 mg total) by mouth 2 (two) times daily. 20 tablet 0  . predniSONE (DELTASONE) 20 MG tablet Take 2 tablets (40 mg total) by mouth daily with breakfast. 8 tablet 0   No facility-administered medications prior to visit.     EXAM:  BP 128/80 mmHg  Pulse 71  Temp(Src) 98.1 F (36.7 C) (Oral)  Ht 5\' 4"  (1.626 m)  Wt 140 lb 1.6 oz (63.549 kg)  BMI 24.04 kg/m2  SpO2 98%  LMP 11/29/1986  Body mass index is 24.04 kg/(m^2).  GENERAL: vitals reviewed and listed above, alert, oriented, appears well hydrated and in no acute distressShe is mildly hoarse but in no distress mild nasal congestion. Face nontender nares clogged TMs clear. HEENT: atraumatic, conjunctiva  clear, no obvious abnormalities on inspection of external nose and ears OP : no lesion edema or exudate  NECK: no obvious masses on inspection palpation  LUNGS: clear to auscultation bilaterally, no wheezes, rales or rhonchi, question optimal air movement. CV: HRRR, no clubbing cyanosis or  peripheral edema nl cap refill  MS: moves all extremities without noticeable focal  abnormality PSYCH: pleasant and cooperative, no obvious depression or anxiety X-ray shows hyperinflation possible bronchitis or interstitial marking increase possible COPD. IMPRESSION: 1. Hyperexpanded lungs suggesting COPD. Suspect associated chronic bronchitic changes centrally and some degree chronic interstitial lung disease. 2. No acute findings. No evidence of pneumonia.   Electronically Signed  By: Franki Cabot M.D.  On: 05/03/2016 14:44 ASSESSMENT AND PLAN:  Discussed the following assessment and plan:  Cough, persistent - Plan: Pulmonary function test  Memory difficulty  Insomnia  Sleep disturbance  Abnormal chest x-ray - Plan: Pulmonary function  test Because of persistence of above possible allergic underlying x-ray consistent more with air-trapping or chronic symptoms would get more evaluation. Would try albuterol if it's less expensive empiric Singulair and the nasal cortisone get pulmonary function tests and follow-up after that. Advise pulmonary consult. Her mom had idiopathic pulmonary fibrosis.  Memory and sleep problems readdressed discussed potential should follow through get a follow-up appointment with Dr. Roddie Mc. Is possible she has have a sleep from previous anxiety etc. or other. Certainly her sleep habits and  hygiene are suboptimal for sleep or turning around. Would still consider sleep apnea or other because of her awakenings and her family history of sleep apnea. Would not advise mind altering medications antihistamines for sleep if she is having cognitive issues. -Patient advised to return or notify health care team  if symptoms worsen ,persist or new concerns arise.  Patient Instructions   I advise     Getting lung function  tests and consider   Seeing pulmonary sx .  You will be contacted about a time.  Try a different  albuterol bronchodilator  For now to see if it is less expensive.  nasal cotisone   Every day for at least 3 weeks for the chronic sinus sx .   Can try adding singulair  Since  You have hx of positive allergy testing  Follow-up appointment in 3-4 weeks after all of above done or if worse.  Consideration of getting a pulmonary consult if ongoing issues. .   Get back with dr Dohmier   Call if need referral     Standley Brooking. Joshva Labreck M.D.

## 2016-07-07 ENCOUNTER — Encounter (INDEPENDENT_AMBULATORY_CARE_PROVIDER_SITE_OTHER): Payer: 59 | Admitting: Internal Medicine

## 2016-07-07 DIAGNOSIS — R053 Chronic cough: Secondary | ICD-10-CM

## 2016-07-07 DIAGNOSIS — R05 Cough: Secondary | ICD-10-CM | POA: Diagnosis not present

## 2016-07-07 DIAGNOSIS — R9389 Abnormal findings on diagnostic imaging of other specified body structures: Secondary | ICD-10-CM

## 2016-07-07 LAB — PULMONARY FUNCTION TEST
DL/VA % PRED: 98 %
DL/VA: 4.74 ml/min/mmHg/L
DLCO UNC % PRED: 90 %
DLCO UNC: 22.21 ml/min/mmHg
DLCO cor % pred: 90 %
DLCO cor: 22.27 ml/min/mmHg
FEF 25-75 PRE: 3.05 L/s
FEF 25-75 Post: 3.43 L/sec
FEF2575-%Change-Post: 12 %
FEF2575-%Pred-Post: 176 %
FEF2575-%Pred-Pre: 156 %
FEV1-%CHANGE-POST: 2 %
FEV1-%PRED-POST: 112 %
FEV1-%PRED-PRE: 109 %
FEV1-POST: 2.6 L
FEV1-PRE: 2.54 L
FEV1FVC-%Change-Post: 0 %
FEV1FVC-%Pred-Pre: 110 %
FEV6-%CHANGE-POST: 1 %
FEV6-%PRED-POST: 105 %
FEV6-%Pred-Pre: 103 %
FEV6-POST: 3.08 L
FEV6-PRE: 3.02 L
FEV6FVC-%PRED-POST: 104 %
FEV6FVC-%PRED-PRE: 104 %
FVC-%Change-Post: 1 %
FVC-%Pred-Post: 100 %
FVC-%Pred-Pre: 99 %
FVC-Post: 3.08 L
FVC-Pre: 3.02 L
POST FEV6/FVC RATIO: 100 %
PRE FEV6/FVC RATIO: 100 %
Post FEV1/FVC ratio: 85 %
Pre FEV1/FVC ratio: 84 %
RV % PRED: 100 %
RV: 2.2 L
TLC % PRED: 102 %
TLC: 5.2 L

## 2016-07-14 NOTE — Progress Notes (Signed)
Pre visit review using our clinic review tool, if applicable. No additional management support is needed unless otherwise documented below in the visit note.  Chief Complaint  Patient presents with  . Follow-up    HPI: Leslie Reed 70 y.o.  comes in for follow-up of cough pulmonary functions and other problems. She has taken Singulair but has run out of for the last 3-4 days. She has not had a problem with a cough for about the last 3 weeks although today has some postnasal drainage. She has a history of heartburn and reflux but nothing worse. She had pulmonary function tests done last week and felt that a reactivated of pain or discomfort in the mid right chest radiating to the fact that she's had with deep breathing every once in a while since pregnancy with her children. Not associated with cough dysphasia shortness of breath or hemoptysis.  She is still concerned about her sleep it is been problematic for so long. Had a friend advised that trazodone 50 mg worked really well for them and asks if it's appropriate. She hasn't contacted neurology office yet about memory and sleep. She did see Dr. Roddie Mc a few years ago. She doesn't really feel that the albuterol has helped that much but she wasn't wheezing. Has cut down her Sudafed Tylenol 2 about 10% and avoiding headaches using Vicks inhalation better for her nose and sinuses.  C ROS: See pertinent positives and negatives per HPI.  Past Medical History:  Diagnosis Date  . Arthritis   . Colitis 8/04  . Diverticulitis   . History of migraine headaches   . Hx of colonic polyp   . Hx of varicella   . 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 Mitral valve prolapse   . Osteopenia   . Scoliosis 2005  . Tick bite 2014   STARI-antibiotics x 5 weeks    Family History  Problem Relation Age of Onset  . Diabetes Mother   . Pulmonary fibrosis Mother     30 deceased  . Hypertension Father   . Alzheimer's disease Father   . Kidney failure  Father   . Diabetes Maternal Grandmother   . Cancer Maternal Grandfather     ??  . Hypertension Mother     Social History   Social History  . Marital status: Married    Spouse name: N/A  . Number of children: N/A  . Years of education: N/A   Social History Main Topics  . Smoking status: Never Smoker  . Smokeless tobacco: Never Used  . Alcohol use Yes     Comment: 3 or so time a week-wine or beer  . Drug use: No  . Sexual activity: Yes    Partners: Male    Birth control/ protection: Surgical     Comment: TAH   Other Topics Concern  . 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 albuterol (PROAIR HFA) 108 (90 Base) MCG/ACT inhaler Inhale 2 puffs into the lungs every 6 (six) hours as needed for wheezing or shortness of breath. genenric or least expensive 1 Inhaler 1  . montelukast (SINGULAIR) 10 MG tablet  Take 1 tablet (10 mg total) by mouth at bedtime. 30 tablet 3  . pseudoephedrine-acetaminophen (TYLENOL SINUS) 30-500 MG TABS Take 1 tablet by mouth as needed.    . Vitamin D, Ergocalciferol, (DRISDOL) 50000 UNITS CAPS capsule Take one capsule by mouth every 2 weeks (14 days). 6 capsule 3   No facility-administered medications prior to visit.      EXAM:  BP 128/62 (BP Location: Right Arm, Patient Position: Sitting, Cuff Size: Normal)   Temp 98.3 F (36.8 C) (Oral)   Wt 142 lb 4.8 oz (64.5 kg)   LMP 11/29/1986   BMI 24.43 kg/m   Body mass index is 24.43 kg/m.  GENERAL: vitals reviewed and listed above, alert, oriented, appears well hydrated and in no acute distressHas an occasional dry upper airway cough. HEENT: atraumatic, conjunctiva  clear, no obvious abnormalities on inspection of external nose and ears OP :  no lesion edema or exudate  NECK: no obvious masses on inspection palpation  LUNGS: clear to auscultation bilaterally, no wheezes, rales or rhonchi, good air movement cw tender right parasternal area cc junction t 9 - 10 CV: HRRR, no clubbing cyanosis or  peripheral edema nl cap refill  MS: moves all extremities without noticeable focal  abnormality Some increase motor activity no tremor ? Mild anxiety .    ASSESSMENT AND PLAN:  Discussed the following assessment and plan:  Cough, persistent - better some may be drainage cough some reflux but better until today question if Singulair suppressing restart if cough returns .pfts nlhx allergy  Insomnia  Memory difficulty  Sleep disturbance  Chest wall discomfort pfts are normal   Sleep still an issues .  She may have restless leg other issues I want her to get back to see Dr. Roddie Mc for advice. Can try low dose trazodone risk-benefit expectant management in the short run. Morning about sedation.A? 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 Contact us if you need a referral back to neurology.   -Patient advised to return or notify health care team  if symptoms worsen ,persist or new concerns arise.  Patient Instructions  Pulmonary functions are normal at this time .   Uncertain if the singulair.   If helping    Can stay off and if  Cough comes back  .  Then restart   You have refills Please contact  Dr Dohmier office about the sleep and memory  Reevaluation.   Can try the low dose trazadone .  At night   To see if helps but no guarantee.     cpx in 3-6 months     Standley Brooking. Panosh M.D.

## 2016-07-15 ENCOUNTER — Encounter: Payer: Self-pay | Admitting: Internal Medicine

## 2016-07-15 ENCOUNTER — Ambulatory Visit (INDEPENDENT_AMBULATORY_CARE_PROVIDER_SITE_OTHER): Payer: 59 | Admitting: Internal Medicine

## 2016-07-15 VITALS — BP 128/62 | Temp 98.3°F | Wt 142.3 lb

## 2016-07-15 DIAGNOSIS — R413 Other amnesia: Secondary | ICD-10-CM

## 2016-07-15 DIAGNOSIS — R05 Cough: Secondary | ICD-10-CM

## 2016-07-15 DIAGNOSIS — R0789 Other chest pain: Secondary | ICD-10-CM

## 2016-07-15 DIAGNOSIS — G479 Sleep disorder, unspecified: Secondary | ICD-10-CM | POA: Diagnosis not present

## 2016-07-15 DIAGNOSIS — G4700 Insomnia, unspecified: Secondary | ICD-10-CM | POA: Diagnosis not present

## 2016-07-15 DIAGNOSIS — R053 Chronic cough: Secondary | ICD-10-CM

## 2016-07-15 DIAGNOSIS — R071 Chest pain on breathing: Secondary | ICD-10-CM

## 2016-07-15 MED ORDER — TRAZODONE HCL 50 MG PO TABS: 25.0000 mg | ORAL_TABLET | Freq: Every evening | ORAL | 1 refills | Status: DC | PRN

## 2016-07-15 NOTE — Patient Instructions (Addendum)
Pulmonary functions are normal at this time .   Uncertain if the singulair.   If helping    Can stay off and if  Cough comes back  .  Then restart   You have refills Please contact  Dr Dohmier office about the sleep and memory  Reevaluation.   Can try the low dose trazadone .  At night   To see if helps but no guarantee.     cpx in 3-6 months

## 2016-07-15 NOTE — Progress Notes (Signed)
I agree with the assessment and plan as directed by   [facility], MD

## 2016-08-24 ENCOUNTER — Encounter: Payer: Self-pay | Admitting: Obstetrics & Gynecology

## 2016-08-25 ENCOUNTER — Other Ambulatory Visit: Payer: Self-pay | Admitting: Obstetrics & Gynecology

## 2016-08-25 NOTE — Telephone Encounter (Signed)
Medication refill request: Vitamin D 50000 Last AEX:  07/01/15 SM Next AEX: 10/05/16 Last MMG (if hormonal medication request): 08/03/16 [id number] negative Refill authorized: 07/02/15 #6 w/3 refills; today please advise

## 2016-08-26 ENCOUNTER — Ambulatory Visit: Payer: Commercial Managed Care - HMO | Admitting: *Deleted

## 2016-08-26 VITALS — Ht 64.0 in | Wt 139.6 lb

## 2016-08-26 DIAGNOSIS — Z86010 Personal history of colonic polyps: Secondary | ICD-10-CM

## 2016-08-26 MED ORDER — SUPREP BOWEL PREP KIT 17.5-3.13-1.6 GM/177ML PO SOLN: 1.0000 | Freq: Once | ORAL | 0 refills | Status: AC

## 2016-08-26 NOTE — Progress Notes (Signed)
Patient denies any allergies to egg or soy products. Patient denies complications with anesthesia/sedation.  Patient denies oxygen use at home and denies diet medications. Emmi instructions for colonoscopy explained and pamphlet given.

## 2016-09-02 ENCOUNTER — Encounter: Payer: Self-pay | Admitting: Gastroenterology

## 2016-09-09 ENCOUNTER — Ambulatory Visit (AMBULATORY_SURGERY_CENTER): Payer: Commercial Managed Care - HMO | Admitting: Gastroenterology

## 2016-09-09 ENCOUNTER — Encounter: Payer: Self-pay | Admitting: Gastroenterology

## 2016-09-09 VITALS — BP 108/59 | HR 62 | Temp 98.0°F | Resp 10 | Ht 64.0 in | Wt 142.0 lb

## 2016-09-09 DIAGNOSIS — Z86010 Personal history of colonic polyps: Secondary | ICD-10-CM

## 2016-09-09 DIAGNOSIS — Z1211 Encounter for screening for malignant neoplasm of colon: Secondary | ICD-10-CM

## 2016-09-09 HISTORY — PX: COLONOSCOPY: SHX174

## 2016-09-09 MED ORDER — SODIUM CHLORIDE 0.9 % IV SOLN: 500.0000 mL | INTRAVENOUS | Status: DC

## 2016-09-09 NOTE — Op Note (Signed)
[facility] Patient Name: Leslie Reed Procedure Date: 09/09/2016 9:06 AM MRN: [id number] Endoscopist: Mauri Pole , MD Age: 70 Referring MD:  Date of Birth: 1946-06-30 Gender: Female Account #: [id number] Procedure:                Colonoscopy Indications:              Surveillance: Personal history of adenomatous                            polyps >1cm on last colonoscopy 5 years ago Medicines:                Monitored Anesthesia Care Procedure:                Pre-Anesthesia Assessment:                           - Prior to the procedure, a History and Physical                            was performed, and patient medications and                            allergies were reviewed. The patient's tolerance of                            previous anesthesia was also reviewed. The risks                            and benefits of the procedure and the sedation                            options and risks were discussed with the patient.                            All questions were answered, and informed consent                            was obtained. Prior Anticoagulants: The patient has                            taken no previous anticoagulant or antiplatelet                            agents. ASA Grade Assessment: II - A patient with                            mild systemic disease. After reviewing the risks                            and benefits, the patient was deemed in                            satisfactory condition to undergo the procedure.  After obtaining informed consent, the colonoscope                            was passed under direct vision. Throughout the                            procedure, the patient's blood pressure, pulse, and                            oxygen saturations were monitored continuously. The                            Model CF-HQ190L [id number]) scope was introduced                            through the  anus and advanced to the the cecum,                            identified by appendiceal orifice and ileocecal                            valve. The colonoscopy was performed without                            difficulty. The patient tolerated the procedure                            well. The quality of the bowel preparation was                            excellent. The ileocecal valve, appendiceal                            orifice, and rectum were photographed. Scope In: 9:28:20 AM Scope Out: 9:42:03 AM Scope Withdrawal Time: 0 hours 6 minutes 21 seconds  Total Procedure Duration: 0 hours 13 minutes 43 seconds  Findings:                 The perianal and digital rectal examinations were                            normal.                           Multiple small and large-mouthed diverticula were                            found in the entire colon. There was evidence of an                            impacted diverticulum. There was no evidence of                            diverticular bleeding.  Non-bleeding internal hemorrhoids were found during                            retroflexion. The hemorrhoids were small.                           The exam was otherwise without abnormality. Complications:            No immediate complications. Estimated Blood Loss:     Estimated blood loss: none. Impression:               - Moderate diverticulosis in the entire examined                            colon. There was evidence of an impacted                            diverticulum. There was no evidence of diverticular                            bleeding.                           - Non-bleeding internal hemorrhoids.                           - The examination was otherwise normal.                           - No specimens collected. Recommendation:           - Patient has a contact number available for                            emergencies. The signs and symptoms of  potential                            delayed complications were discussed with the                            patient. Return to normal activities tomorrow.                            Written discharge instructions were provided to the                            patient.                           - Resume previous diet.                           - Continue present medications.                           - Repeat colonoscopy in 5 years for surveillance  based on personal history of previous adenomatous                            polyps. Mauri Pole, MD 09/09/2016 9:51:04 AM This report has been signed electronically.

## 2016-09-09 NOTE — Patient Instructions (Signed)
Impression/Recommendation:  Diverticulosis handout given. Hemorrhoids handout given.  Repeat colonoscopy in 5 years (2022).  YOU HAD AN ENDOSCOPIC PROCEDURE TODAY AT [facility]:   Refer to the procedure report that was given to you for any specific questions about what was found during the examination.  If the procedure report does not answer your questions, please call your gastroenterologist to clarify.  If you requested that your care partner not be given the details of your procedure findings, then the procedure report has been included in a sealed envelope for you to review at your convenience later.  YOU SHOULD EXPECT: Some feelings of bloating in the abdomen. Passage of more gas than usual.  Walking can help get rid of the air that was put into your GI tract during the procedure and reduce the bloating. If you had a lower endoscopy (such as a colonoscopy or flexible sigmoidoscopy) you may notice spotting of blood in your stool or on the toilet paper. If you underwent a bowel prep for your procedure, you may not have a normal bowel movement for a few days.  Please Note:  You might notice some irritation and congestion in your nose or some drainage.  This is from the oxygen used during your procedure.  There is no need for concern and it should clear up in a day or so.  SYMPTOMS TO REPORT IMMEDIATELY:   Following lower endoscopy (colonoscopy or flexible sigmoidoscopy):  Excessive amounts of blood in the stool  Significant tenderness or worsening of abdominal pains  Swelling of the abdomen that is new, acute  Fever of 100F or higher  For urgent or emergent issues, a gastroenterologist can be reached at any hour by calling [phone].   DIET:  We do recommend a small meal at first, but then you may proceed to your regular diet.  Drink plenty of fluids but you should avoid alcoholic beverages for 24 hours.  ACTIVITY:  You should plan to take it easy for the rest of  today and you should NOT DRIVE or use heavy machinery until tomorrow (because of the sedation medicines used during the test).    FOLLOW UP: Our staff will call the number listed on your records the next business day following your procedure to check on you and address any questions or concerns that you may have regarding the information given to you following your procedure. If we do not reach you, we will leave a message.  However, if you are feeling well and you are not experiencing any problems, there is no need to return our call.  We will assume that you have returned to your regular daily activities without incident.  If any biopsies were taken you will be contacted by phone or by letter within the next 1-3 weeks.  Please call us at [phone] if you have not heard about the biopsies in 3 weeks.    SIGNATURES/CONFIDENTIALITY: You and/or your care partner have signed paperwork which will be entered into your electronic medical record.  These signatures attest to the fact that that the information above on your After Visit Summary has been reviewed and is understood.  Full responsibility of the confidentiality of this discharge information lies with you and/or your care-partner.

## 2016-09-09 NOTE — Progress Notes (Signed)
To recovery awake alert VSS report to [employer]

## 2016-09-10 ENCOUNTER — Telehealth: Payer: Self-pay | Admitting: *Deleted

## 2016-09-10 NOTE — Telephone Encounter (Signed)
  Follow up Call-  Call back number 09/09/2016  Post procedure Call Back phone  # [phone]  Permission to leave phone message Yes  Some recent data might be hidden     Patient questions:  Do you have a fever, pain , or abdominal swelling? No. Pain Score  0 *  Have you tolerated food without any problems? Yes.    Have you been able to return to your normal activities? Yes.    Do you have any questions about your discharge instructions: Diet   Yes.   Medications  No. Follow up visit  No.  Do you have questions or concerns about your Care? No.  Actions: * If pain score is 4 or above: No action needed, pain <4.  Pt. States she has lots of loose stools post cholecystectomy.  Advised that she Try high fiber diet to add some bulk to her stools.  She doesn't know if she got the diet yesterday.  I urged her To call back if she needs a diet.  She verbalized understanding.

## 2016-10-05 ENCOUNTER — Ambulatory Visit: Payer: Managed Care, Other (non HMO) | Admitting: Obstetrics & Gynecology

## 2016-10-05 NOTE — Progress Notes (Deleted)
69 y.o. [id number] MarriedCaucasianF here for annual exam.    Patient's last menstrual period was 11/29/1986.          Sexually active: {yes no:314532}  The current method of family planning is status post hysterectomy.    Exercising: {yes no:314532}  {types:19826} Smoker:  no  Health Maintenance: Pap:  11/11/08 negative  History of abnormal Pap:  no MMG:  08/03/16 BIRADS 1 negative  Colonoscopy:  09/09/16 repeat 5 years   BMD:   06/05/12 osteopenia in hip TDaP:  04/18/13 Pneumonia vaccine(s):  *** Zostavax:   *** Hep C testing: *** Screening Labs: ***, Hb today: ***, Urine today: ***   reports that she has never smoked. She has never used smokeless tobacco. She reports that she drinks alcohol. She reports that she does not use drugs.  Past Medical History:  Diagnosis Date  . Allergy   . Arthritis    hands, lower back  . Bronchitis    uses inhaler prn  . Cataract    just being monitored - no surgery  . Colitis 8/04  . Diverticulitis   . GERD (gastroesophageal reflux disease)    diet controlled, no meds  . History of migraine headaches   . Hx of colonic polyp   . Hx of varicella   . Insomnia   . 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 Mitral valve prolapse   . Osteopenia   . Scoliosis 2005  . Tick bite 2014   STARI-antibiotics x 5 weeks    Past Surgical History:  Procedure Laterality Date  . ABDOMINAL HYSTERECTOMY     partial  for pain no cancer    . APPENDECTOMY    . BREAST BIOPSY Right    x 2   . CESAREAN SECTION     x4  . CHOLECYSTECTOMY    . colon polyps    . COLONOSCOPY    . DILATION AND CURETTAGE OF UTERUS     x 2  . WISDOM TOOTH EXTRACTION      Current Outpatient Prescriptions  Medication Sig Dispense Refill  . montelukast (SINGULAIR) 10 MG tablet Take 1 tablet (10 mg total) by mouth at bedtime. 30 tablet 3  . pseudoephedrine-acetaminophen (TYLENOL SINUS) 30-500 MG TABS Take 1 tablet by mouth as needed.    . traZODone (DESYREL) 50 MG tablet Take 0.5-1 tablets  (25-50 mg total) by mouth at bedtime as needed for sleep. (Patient not taking: Reported on 09/09/2016) 30 tablet 1  . Vitamin D, Ergocalciferol, (DRISDOL) 50000 units CAPS capsule TAKE 1 CAPSULE BY MOUTH EVERY TWO WEEKS 6 capsule 0   Current Facility-Administered Medications  Medication Dose Route Frequency Provider Last Rate Last Dose  . 0.9 %  sodium chloride infusion  500 mL Intravenous Continuous Mauri Pole, MD        Family History  Problem Relation Age of Onset  . Diabetes Mother   . Pulmonary fibrosis Mother     59 deceased  . Hypertension Mother   . Hypertension Father   . Alzheimer's disease Father   . Kidney failure Father   . Diabetes Maternal Grandmother   . Cancer Maternal Grandfather     ??  . Colon cancer Neg Hx   . Esophageal cancer Neg Hx   . Rectal cancer Neg Hx   . Stomach cancer Neg Hx     ROS:  Pertinent items are noted in HPI.  Otherwise, a comprehensive ROS was negative.  Exam:   LMP 11/29/1986   Weight change: @  WEIGHTCHANGE@ Height:      Ht Readings from Last 3 Encounters:  09/09/16 5\' 4"  (1.626 m)  08/26/16 5\' 4"  (1.626 m)  05/24/16 5\' 4"  (1.626 m)    General appearance: alert, cooperative and appears stated age Head: Normocephalic, without obvious abnormality, atraumatic Neck: no adenopathy, supple, symmetrical, trachea midline and thyroid {EXAM; THYROID:18604} Lungs: clear to auscultation bilaterally Breasts: {Exam; breast:13139::"normal appearance, no masses or tenderness"} Heart: regular rate and rhythm Abdomen: soft, non-tender; bowel sounds normal; no masses,  no organomegaly Extremities: extremities normal, atraumatic, no cyanosis or edema Skin: Skin color, texture, turgor normal. No rashes or lesions Lymph nodes: Cervical, supraclavicular, and axillary nodes normal. No abnormal inguinal nodes palpated Neurologic: Grossly normal   Pelvic: External genitalia:  no lesions              Urethra:  normal appearing urethra with no  masses, tenderness or lesions              Bartholins and Skenes: normal                 Vagina: normal appearing vagina with normal color and discharge, no lesions              Cervix: {exam; cervix:14595}              Pap taken: {yes no:314532} Bimanual Exam:  Uterus:  {exam; uterus:12215}              Adnexa: {exam; adnexa:12223}               Rectovaginal: Confirms               Anus:  normal sphincter tone, no lesions  Chaperone was present for exam.  A:  Well Woman with normal exam  P:   {plan; gyn:5269::"mammogram","pap smear","return annually or prn"}

## 2016-10-08 ENCOUNTER — Ambulatory Visit (INDEPENDENT_AMBULATORY_CARE_PROVIDER_SITE_OTHER): Payer: Commercial Managed Care - HMO | Admitting: Obstetrics & Gynecology

## 2016-10-08 ENCOUNTER — Encounter: Payer: Self-pay | Admitting: Obstetrics & Gynecology

## 2016-10-08 VITALS — BP 120/70 | HR 78 | Resp 14 | Ht 64.0 in | Wt 139.8 lb

## 2016-10-08 DIAGNOSIS — Z01419 Encounter for gynecological examination (general) (routine) without abnormal findings: Secondary | ICD-10-CM | POA: Diagnosis not present

## 2016-10-08 DIAGNOSIS — R413 Other amnesia: Secondary | ICD-10-CM

## 2016-10-08 DIAGNOSIS — E559 Vitamin D deficiency, unspecified: Secondary | ICD-10-CM | POA: Diagnosis not present

## 2016-10-08 DIAGNOSIS — Z0000 Encounter for general adult medical examination without abnormal findings: Secondary | ICD-10-CM

## 2016-10-08 DIAGNOSIS — Z205 Contact with and (suspected) exposure to viral hepatitis: Secondary | ICD-10-CM | POA: Diagnosis not present

## 2016-10-08 LAB — POCT URINALYSIS DIPSTICK
BILIRUBIN UA: NEGATIVE
Glucose, UA: NEGATIVE
Ketones, UA: NEGATIVE
Leukocytes, UA: NEGATIVE
NITRITE UA: NEGATIVE
PH UA: 5
PROTEIN UA: NEGATIVE
RBC UA: NEGATIVE
UROBILINOGEN UA: NEGATIVE

## 2016-10-08 NOTE — Progress Notes (Signed)
70 y.o. [id number] MarriedCaucasianF here for annual exam.  Doing well.  No vaginal bleeding.  Still feels like she has some memory issues that are related to sleep.    Patient's last menstrual period was 11/29/1986.          Sexually active: Yes.    The current method of family planning is status post hysterectomy.    Exercising: No.  The patient has a physically strenuous job, but has no regular exercise apart from work.  Smoker:  no  Health Maintenance: Pap:  2009 negative  History of abnormal Pap:  no MMG:  08/03/16 BIRADS 1 negative  Colonoscopy:  09/09/16 diverticulosis- repeat 5 years BMD:   7/13 stable osteopenia in hip  TDaP:  04/18/13  Pneumonia vaccine(s):  2016 Zostavax:  3-4 years ago at [facility] C testing: discuss with provider Screening Labs: discuss with provider, Hb today: discuss with provider, Urine today: normal    reports that she has never smoked. She has never used smokeless tobacco. She reports that she drinks alcohol. She reports that she does not use drugs.  Past Medical History:  Diagnosis Date  . Allergy   . Arthritis    hands, lower back  . Bronchitis    uses inhaler prn  . Cataract    just being monitored - no surgery  . Colitis 8/04  . Diverticulitis   . GERD (gastroesophageal reflux disease)    diet controlled, no meds  . History of migraine headaches   . Hx of colonic polyp   . Hx of varicella   . Insomnia   . 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 Mitral valve prolapse   . Osteopenia   . Scoliosis 2005  . Tick bite 2014   STARI-antibiotics x 5 weeks    Past Surgical History:  Procedure Laterality Date  . ABDOMINAL HYSTERECTOMY     partial  for pain no cancer    . APPENDECTOMY    . BREAST BIOPSY Right    x 2   . CESAREAN SECTION     x4  . CHOLECYSTECTOMY    . colon polyps    . COLONOSCOPY    . DILATION AND CURETTAGE OF UTERUS     x 2  . WISDOM TOOTH EXTRACTION      Current Outpatient Prescriptions  Medication Sig Dispense Refill   . montelukast (SINGULAIR) 10 MG tablet Take 1 tablet (10 mg total) by mouth at bedtime. 30 tablet 3  . pseudoephedrine-acetaminophen (TYLENOL SINUS) 30-500 MG TABS Take 1 tablet by mouth as needed.    . Vitamin D, Ergocalciferol, (DRISDOL) 50000 units CAPS capsule TAKE 1 CAPSULE BY MOUTH EVERY TWO WEEKS 6 capsule 0  . traZODone (DESYREL) 50 MG tablet Take 0.5-1 tablets (25-50 mg total) by mouth at bedtime as needed for sleep. (Patient not taking: Reported on 10/08/2016) 30 tablet 1   No current facility-administered medications for this visit.     Family History  Problem Relation Age of Onset  . Diabetes Mother   . Pulmonary fibrosis Mother     38 deceased  . Hypertension Mother   . Hypertension Father   . Alzheimer's disease Father   . Kidney failure Father   . Diabetes Maternal Grandmother   . Cancer Maternal Grandfather     ??  . Colon cancer Neg Hx   . Esophageal cancer Neg Hx   . Rectal cancer Neg Hx   . Stomach cancer Neg Hx     ROS:  Pertinent  items are noted in HPI.  Otherwise, a comprehensive ROS was negative.  Exam:   BP 120/70 (BP Location: Right Arm, Patient Position: Sitting, Cuff Size: Normal)   Pulse 78   Resp 14   Ht 5\' 4"  (1.626 m)   Wt 139 lb 12.8 oz (63.4 kg)   LMP 11/29/1986   BMI 24.00 kg/m   Weight change: +3#   Height: 5\' 4"  (162.6 cm)  Ht Readings from Last 3 Encounters:  10/08/16 5\' 4"  (1.626 m)  09/09/16 5\' 4"  (1.626 m)  08/26/16 5\' 4"  (1.626 m)    General appearance: alert, cooperative and appears stated age Head: Normocephalic, without obvious abnormality, atraumatic Neck: no adenopathy, supple, symmetrical, trachea midline and thyroid normal to inspection and palpation Lungs: clear to auscultation bilaterally Breasts: normal appearance, no masses or tenderness Heart: regular rate and rhythm Abdomen: soft, non-tender; bowel sounds normal; no masses,  no organomegaly Extremities: extremities normal, atraumatic, no cyanosis or edema Skin:  Skin color, texture, turgor normal. No rashes or lesions Lymph nodes: Cervical, supraclavicular, and axillary nodes normal. No abnormal inguinal nodes palpated Neurologic: Grossly normal   Pelvic: External genitalia:  no lesions              Urethra:  normal appearing urethra with no masses, tenderness or lesions              Bartholins and Skenes: normal                 Vagina: normal appearing vagina with normal color and discharge, no lesions              Cervix: absent              Pap taken: No. Bimanual Exam:  Uterus:  uterus absent              Adnexa: normal adnexa and no mass, fullness, tenderness               Rectovaginal: Confirms               Anus:  normal sphincter tone, no lesions  Chaperone was present for exam.  A:  Well Woman with normal exam, no HRT  Osteopenia Colonic polyps.  Follow up due 5 years MVP  H/o TAH due to chronic pelvic pain 2/88.  P:  Mammogram yearly Vit D, Hep C, B12 No paps due to h/o TAH Plan BMD next year.  Will plan with MMG next year.    Seeing Dr. Regis Bill in January Return annually or prn

## 2016-10-09 LAB — VITAMIN B12: Vitamin B-12: 384 pg/mL (ref 200–1100)

## 2016-10-09 LAB — VITAMIN D 25 HYDROXY (VIT D DEFICIENCY, FRACTURES): Vit D, 25-Hydroxy: 28 ng/mL — ABNORMAL LOW (ref 30–100)

## 2016-10-09 LAB — HEPATITIS C ANTIBODY: HCV Ab: NEGATIVE

## 2016-10-14 ENCOUNTER — Telehealth: Payer: Self-pay | Admitting: *Deleted

## 2016-10-14 MED ORDER — VITAMIN D (ERGOCALCIFEROL) 1.25 MG (50000 UNIT) PO CAPS: ORAL_CAPSULE | ORAL | 0 refills | Status: DC

## 2016-10-14 NOTE — Telephone Encounter (Signed)
Left message per DPR letting patient know prescription for Vitamin D to take once every 2 weeks had been called in to her pharmacy on file. Instructed patient to call with any additional questions.    Routing to provider for final review. Patient agreeable to disposition. Will close encounter.

## 2016-11-01 NOTE — Progress Notes (Signed)
This encounter was created in error - please disregard.

## 2016-11-30 ENCOUNTER — Other Ambulatory Visit (INDEPENDENT_AMBULATORY_CARE_PROVIDER_SITE_OTHER): Payer: Commercial Managed Care - HMO

## 2016-11-30 DIAGNOSIS — Z0000 Encounter for general adult medical examination without abnormal findings: Secondary | ICD-10-CM | POA: Diagnosis not present

## 2016-11-30 LAB — LIPID PANEL
CHOL/HDL RATIO: 4
Cholesterol: 168 mg/dL (ref 0–200)
HDL: 42.4 mg/dL (ref >39.00)
LDL Cholesterol: 109 mg/dL — ABNORMAL HIGH (ref 0–99)
NONHDL: 125.33
Triglycerides: 80 mg/dL (ref 0.0–149.0)
VLDL: 16 mg/dL (ref 0.0–40.0)

## 2016-11-30 LAB — BASIC METABOLIC PANEL
BUN: 16 mg/dL (ref 6–23)
CALCIUM: 9.3 mg/dL (ref 8.4–10.5)
CO2: 25 mEq/L (ref 19–32)
Chloride: 107 mEq/L (ref 96–112)
Creatinine, Ser: 0.7 mg/dL (ref 0.40–1.20)
GFR: 88.01 mL/min (ref >60.00)
GLUCOSE: 95 mg/dL (ref 70–99)
Potassium: 4.4 mEq/L (ref 3.5–5.1)
SODIUM: 141 meq/L (ref 135–145)

## 2016-11-30 LAB — CBC WITH DIFFERENTIAL/PLATELET
BASOS ABS: 0 10*3/uL (ref 0.0–0.1)
Basophils Relative: 0.6 % (ref 0.0–3.0)
Eosinophils Absolute: 0.1 10*3/uL (ref 0.0–0.7)
Eosinophils Relative: 1.1 % (ref 0.0–5.0)
HCT: 38.8 % (ref 36.0–46.0)
HEMOGLOBIN: 13.6 g/dL (ref 12.0–15.0)
LYMPHS ABS: 1.7 10*3/uL (ref 0.7–4.0)
Lymphocytes Relative: 31.9 % (ref 12.0–46.0)
MCHC: 35.1 g/dL (ref 30.0–36.0)
MCV: 89.4 fl (ref 78.0–100.0)
MONO ABS: 0.4 10*3/uL (ref 0.1–1.0)
MONOS PCT: 8.5 % (ref 3.0–12.0)
NEUTROS PCT: 57.9 % (ref 43.0–77.0)
Neutro Abs: 3.1 10*3/uL (ref 1.4–7.7)
Platelets: 305 10*3/uL (ref 150.0–400.0)
RBC: 4.34 Mil/uL (ref 3.87–5.11)
RDW: 13.7 % (ref 11.5–15.5)
WBC: 5.3 10*3/uL (ref 4.0–10.5)

## 2016-11-30 LAB — HEPATIC FUNCTION PANEL
ALK PHOS: 81 U/L (ref 39–117)
ALT: 35 U/L (ref 0–35)
AST: 17 U/L (ref 0–37)
Albumin: 4.2 g/dL (ref 3.5–5.2)
Bilirubin, Direct: 0.1 mg/dL (ref 0.0–0.3)
Total Bilirubin: 0.5 mg/dL (ref 0.2–1.2)
Total Protein: 6 g/dL (ref 6.0–8.3)

## 2016-11-30 LAB — TSH: TSH: 1.41 u[IU]/mL (ref 0.35–4.50)

## 2016-12-05 NOTE — Progress Notes (Signed)
Pre visit review using our clinic review tool, if applicable. No additional management support is needed unless otherwise documented below in the visit note.  Chief Complaint  Patient presents with  . Annual Exam    HPI: Patient  Leslie Reed  71 y.o. comes in today for [place] visit  Still working generally well recent coughing illness  Thinks memory  problem  Word finding not as quick   No other noted deterioration still up at night  Sleep issues reads to fall asleep .  Got the prevnar at  [facility] 2016   Health Maintenance  Topic Date Due  . DEXA SCAN  03/24/2012  . PNA vac Low Risk Adult (2 of 2 - PPSV23) 10/14/2016  . MAMMOGRAM  08/03/2018  . TETANUS/TDAP  04/19/2023  . COLONOSCOPY  09/09/2026  . INFLUENZA VACCINE  Addressed  . ZOSTAVAX  Completed  . Hepatitis C Screening  Completed   Health Maintenance Review LIFESTYLE:  Exercise:   No fgetting back  Tobacco/ETS: no Alcohol:  Wine per week  2-3 per week  Sugar beverages: mostly flavored coffee   k cups  Sleep:  Still a problem about 6    Busy    Wakening   Book on sleep .  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  GEN/ HEENT: No fever, significant weight changes sweats headaches vision problems hearing changes, CV/ PULM; No chest pain shortness of breath cough, syncope,edema  change in exercise tolerance. GI /GU: No adominal pain, vomiting, change in bowel habits. No blood in the stool. No significant GU symptoms. SKIN/HEME: ,no acute skin rashes suspicious lesions or bleeding. No lymphadenopathy, nodules, masses.  NEURO/ PSYCH:  No neurologic signs such as weakness numbness. No depression anxiety. IMM/ Allergy: No unusual infections.  Allergy .   REST of 12 system review negative except as per HPI   Past Medical History:  Diagnosis Date  . Allergy   . Arthritis    hands, lower back  . Bronchitis    uses inhaler prn  . Cataract    just being monitored - no surgery  . Colitis 8/04  . Diverticulitis   . GERD (gastroesophageal reflux disease)    diet controlled, no meds  . History of migraine headaches   . Hx of colonic polyp   . Hx of varicella   . Insomnia   . 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 Mitral valve prolapse   . Osteopenia   . Scoliosis 2005  . Tick bite 2014   STARI-antibiotics x 5 weeks    Past Surgical History:  Procedure Laterality Date  . ABDOMINAL HYSTERECTOMY     partial  for pain no cancer    . APPENDECTOMY    . BREAST BIOPSY Right    x 2   . CESAREAN SECTION     x4  . CHOLECYSTECTOMY    . colon polyps    . COLONOSCOPY    . DILATION AND CURETTAGE OF UTERUS     x 2  . WISDOM TOOTH EXTRACTION      Family History  Problem Relation Age of Onset  . Diabetes Mother   . Pulmonary fibrosis Mother     36 deceased  . Hypertension Mother   . Hypertension Father   .  Alzheimer's disease Father   . Kidney failure Father   . Diabetes Maternal Grandmother   . Cancer Maternal Grandfather     ??  . Colon cancer Neg Hx   . Esophageal cancer Neg Hx   . Rectal cancer Neg Hx   . Stomach cancer Neg Hx     Social History   Social History  . Marital status: Married    Spouse name: N/A  . Number of children: N/A  . Years of education: N/A   Social History Main Topics  . Smoking status: Never Smoker  . Smokeless tobacco: Never Used  . Alcohol use Yes     Comment: 3 or so time a week-wine or beer  . Drug use: No  . Sexual activity: Yes    Partners: Male    Birth control/ protection: Surgical, Post-menopausal     Comment: TAH   Other Topics Concern  . 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 pseudoephedrine-acetaminophen (TYLENOL SINUS) 30-500 MG TABS Take 1 tablet by mouth as needed.    . Vitamin D, Ergocalciferol, (DRISDOL) 50000 units CAPS capsule Take one capsule by mouth every 2 weeks. 26 capsule 0  . montelukast (SINGULAIR) 10 MG tablet Take 1 tablet (10 mg total) by mouth at bedtime. 30 tablet 3  . traZODone (DESYREL) 50 MG tablet Take 0.5-1 tablets (25-50 mg total) by mouth at bedtime as needed for sleep. (Patient not taking: Reported on 10/08/2016) 30 tablet 1   No facility-administered medications prior to visit.      EXAM:  BP 116/74 (BP Location: Right Arm, Patient Position: Sitting, Cuff Size: Normal)   Temp 98.5 F (36.9 C) (Oral)   Ht 5\' 4"  (1.626 m)   Wt 144 lb (65.3 kg)   LMP 11/29/1986   BMI 24.72 kg/m   Body mass index is 24.72 kg/m.  Physical Exam: Vital signs reviewed [id number] is a well-developed well-nourished alert cooperative    who appearsr stated age in no acute distress.  Active talking  Nl speech and cognition HEENT: normocephalic atraumatic , Eyes: PERRL EOM's full, conjunctiva clear glasses , Nares: paten,t no deformity discharge or tenderness., Ears: no deformity EAC's clear TMs with normal landmarks. Mouth: clear OP, no lesions, edema.  Moist mucous membranes. Dentition in adequate repair. NECK: supple without masses, thyromegaly or bruits. CHEST/PULM:  Clear to auscultation and percussion breath sounds equal no wheeze , rales or rhonchi. No chest wall deformities or tenderness. CV: PMI is nondisplaced, S1 S2 no gallops, murmurs, rubs. Peripheral pulses are full without delay.No JVD .  ABDOMEN: Bowel sounds normal nontender  No guard or rebound, no hepato splenomegal no CVA tenderness.  No hernia. Extremtities:  No clubbing cyanosis or edema, no acute joint swelling or redness no focal atrophy NEURO:  Oriented x3, cranial nerves 3-12 appear to be intact, no obvious focal weakness,gait within normal  limits no abnormal reflexes or asymmetrical SKIN: No acute rashes normal turgor, color, no bruising or petechiae. PSYCH: Oriented, good eye contact, no obvious depression anxiety, cognition and judgment appear normal. LN: no cervical axillary inguinal adenopathy  Lab Results  Component  Value Date   WBC 5.3 11/30/2016   HGB 13.6 11/30/2016   HCT 38.8 11/30/2016   PLT 305.0 11/30/2016   GLUCOSE 95 11/30/2016   CHOL 168 11/30/2016   TRIG 80.0 11/30/2016   HDL 42.40 11/30/2016   LDLDIRECT 138.0 10/13/2011   LDLCALC 109 (H) 11/30/2016   ALT 35 11/30/2016   AST 17 11/30/2016   NA 141 11/30/2016   K 4.4 11/30/2016   CL 107 11/30/2016   CREATININE 0.70 11/30/2016   BUN 16 11/30/2016   CO2 25 11/30/2016   TSH 1.41 11/30/2016    ASSESSMENT AND PLAN:  Discussed the following assessment and plan:  Visit for preventive health examination  Sleep disturbance  ok to send message  If needs referral  about  Sleep and  Memory ( fam hx dementia) seems to be functioning quite well.  reviewed counseling about  Healthy brain  And sleep .     Patient Care Team: Burnis Medin, MD as PCP - General (Internal Medicine) Lafayette Dragon, MD (Inactive) (Gastroenterology) Stefanie Libel, MD (Family Medicine) Megan Salon, MD (Gynecology) Larey Seat, MD (Neurology) Patient Instructions  Continue lifestyle intervention healthy eating and exercise . This may help sleep and cognition.  Sleep hygiene  .  Let us know if you want a neurology referral .   Get  Your pneumovax .          Standley Brooking. Panosh M.D.

## 2016-12-07 ENCOUNTER — Encounter: Payer: Self-pay | Admitting: Internal Medicine

## 2016-12-07 ENCOUNTER — Ambulatory Visit (INDEPENDENT_AMBULATORY_CARE_PROVIDER_SITE_OTHER): Payer: Commercial Managed Care - HMO | Admitting: Internal Medicine

## 2016-12-07 VITALS — BP 116/74 | Temp 98.5°F | Ht 64.0 in | Wt 144.0 lb

## 2016-12-07 DIAGNOSIS — G479 Sleep disorder, unspecified: Secondary | ICD-10-CM | POA: Diagnosis not present

## 2016-12-07 DIAGNOSIS — Z0000 Encounter for general adult medical examination without abnormal findings: Secondary | ICD-10-CM | POA: Diagnosis not present

## 2016-12-07 DIAGNOSIS — Z23 Encounter for immunization: Secondary | ICD-10-CM | POA: Diagnosis not present

## 2016-12-07 NOTE — Patient Instructions (Addendum)
Continue lifestyle intervention healthy eating and exercise . This may help sleep and cognition.  Sleep hygiene  .  Let us know if you want a neurology referral .   Get  Your pneumovax .

## 2016-12-20 ENCOUNTER — Ambulatory Visit: Payer: Self-pay | Admitting: Internal Medicine

## 2016-12-20 ENCOUNTER — Telehealth: Payer: Self-pay | Admitting: Internal Medicine

## 2016-12-20 NOTE — Progress Notes (Signed)
Pre visit review using our clinic review tool, if applicable. No additional management support is needed unless otherwise documented below in the visit note.  Chief Complaint  Patient presents with  . Hemoptysis    Started last Thursday.  Was sick from last Tues -  Sat.  Taking Mucinex DM     HPI: Leslie Reed 71 y.o.  Sent in by team health  For coughing up blood  Patient patient states that she had a flulike illness the end of last week with fever as above under 102. She had a cough but not the worst she's ever had. She had days of intermittent right red blood mixed in with fairly clear phlegm. Doesn't remember pain nosebleeds shortness of breath.  Fever tues to Saturday .   Thursday coughing mucous  Mostly clear . Not as bad as could.   But yesterday a.m coughed up a quarter-sized glob of bright red blood with mucus and called triage to get advice about if she should be seen or further recommendations of what to expect. They recommended office visit and she is here today. She is actually getting better from the illness. Has not had further hemoptysis since yesterday morning. Denies any nosebleeds bleeding bruising..     She has noticed that her vision is more blurry since she is having illness. Currently not taking: Cough medicine. Trying to get new glasses adjusted the fall with her ophthalmologist. ROS: See pertinent positives and negatives per HPI. No current fever   Rough area ? miole left chest   Growing? Prev der  Dr Sherral Hammers deceased   Past Medical History:  Diagnosis Date  . Allergy   . Arthritis    hands, lower back  . Bronchitis    uses inhaler prn  . Cataract    just being monitored - no surgery  . Colitis 8/04  . Diverticulitis   . GERD (gastroesophageal reflux disease)    diet controlled, no meds  . History of migraine headaches   . Hx of colonic polyp   . Hx of varicella   . Insomnia   . 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 Mitral valve prolapse   . Osteopenia   .  Scoliosis 2005  . Tick bite 2014   STARI-antibiotics x 5 weeks    Family History  Problem Relation Age of Onset  . Diabetes Mother   . Pulmonary fibrosis Mother     85 deceased  . Hypertension Mother   . Hypertension Father   . Alzheimer's disease Father   . Kidney failure Father   . Diabetes Maternal Grandmother   . Cancer Maternal Grandfather     ??  . Colon cancer Neg Hx   . Esophageal cancer Neg Hx   . Rectal cancer Neg Hx   . Stomach cancer Neg Hx     Social History   Social History  . Marital status: Married    Spouse name: N/A  . Number of children: N/A  . Years of education: N/A   Social History Main Topics  . Smoking status: Never Smoker  . Smokeless tobacco: Never Used  . Alcohol use Yes     Comment: 3 or so time a week-wine or beer  . Drug use: No  . Sexual activity: Yes    Partners: Male    Birth control/ protection: Surgical, Post-menopausal     Comment: TAH   Other Topics Concern  . 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 pseudoephedrine-acetaminophen (TYLENOL SINUS) 30-500 MG TABS Take 1 tablet by mouth as needed.    . Vitamin D, Ergocalciferol, (DRISDOL) 50000 units CAPS capsule Take one capsule by mouth every 2 weeks. 26 capsule 0   No facility-administered medications prior to visit.      EXAM:  BP 120/74 (BP Location: Left Arm, Patient Position: Sitting, Cuff Size: Normal)   Pulse 66   Temp 97.8 F (36.6 C) (Oral)   Ht 5\' 4"  (1.626 m)   Wt 140 lb (63.5 kg)   LMP 11/29/1986   SpO2 98%   BMI 24.03 kg/m   Body mass index is 24.03 kg/m.  GENERAL: vitals reviewed and listed above, alert, oriented, appears well hydrated and in no acute  distress mild congestion cough  HEENT: atraumatic, conjunctiva  clear, no obvious abnormalities on inspection of external nose and ears tm clear nares min congestionOP : no lesion edema or exudate  NECK: no obvious masses on inspection palpation  LUNGS: clear to auscultation bilaterally, no wheezes, rales or rhonchi, good air movement CV: HRRR, no clubbing cyanosis or  peripheral edema nl cap refill  MS: moves all extremities without noticeable focal  abnormality  Skin: normal capillary refill ,turgor , color: No acute rashes ,petechiae or bruising  Left upper chest rought flesh colored round lesion 3 mm some scale  Wt Readings from Last 3 Encounters:  12/21/16 140 lb (63.5 kg)  12/07/16 144 lb (65.3 kg)  10/08/16 139 lb 12.8 oz (63.4 kg)    ASSESSMENT AND PLAN:  Discussed the following assessment and plan:  Hemoptysis - Reassuring exam probably from respiratory infection get chest x-ray if not recurrent after another week and resolution contact us for further advice. - Plan: DG Chest 2 View  Febrile respiratory illness - Improved convalescent flulike in nature. - Plan: DG Chest 2 View  Skin lesion - i fincreasding size consdier derm check and or bx uncertain appraisla at this time.   -Patient advised to return or notify health care team  if symptoms worsen ,persist or new concerns arise.  Patient Instructions  Exam is  Reassuring.  But get chest x ray  As discussed .   If ongoing let us know  If not resolving .   [place]   Is good for  Lesion eval derm  [id number], [place], [place] [address] [phone]     Standley Brooking. Nawaf Strange M.D.

## 2016-12-20 NOTE — Telephone Encounter (Signed)
Noted  

## 2016-12-20 NOTE — Telephone Encounter (Signed)
[facility]  Patient Name: Leslie Reed  DOB: [birth year]    Initial Comment Caller says, flu since last Tues, since last Thurs, she has been coughing up some blood. She wants to know how long this will continue ?    Nurse Assessment  Nurse: Harlow Mares, RN, Suanne Marker Date/Time (Eastern Time): 12/20/2016 2:02:31 PM  Confirm and document reason for call. If symptomatic, describe symptoms. ---Caller says, flu (not dx'd) since last Tues, since last Thurs, she has been coughing up some blood. She wants to know how long this will continue? Thick mucous.  Does the patient have any new or worsening symptoms? ---Yes  Will a triage be completed? ---Yes  Related visit to physician within the last 2 weeks? ---No  Does the PT have any chronic conditions? (i.e. diabetes, asthma, etc.) ---No  Is this a behavioral health or substance abuse call? ---No     Guidelines    Guideline Title Affirmed Question Affirmed Notes  Coughing Up Blood Coughing up rusty-colored sputum    Final Disposition User   See Physician within 24 Hours [place], RN, Suanne Marker    Comments  Appt scheduled with Dr. Regis Bill at the [facility] location for tomorrow at 9:45am. Caller agreed.   Referrals  REFERRED TO PCP OFFICE   Disagree/Comply: Comply

## 2016-12-21 ENCOUNTER — Ambulatory Visit (INDEPENDENT_AMBULATORY_CARE_PROVIDER_SITE_OTHER): Payer: Commercial Managed Care - HMO | Admitting: Internal Medicine

## 2016-12-21 ENCOUNTER — Ambulatory Visit (INDEPENDENT_AMBULATORY_CARE_PROVIDER_SITE_OTHER)
Admission: RE | Admit: 2016-12-21 | Discharge: 2016-12-21 | Disposition: A | Discharge status: Home / Self Care | Payer: Commercial Managed Care - HMO | Source: Ambulatory Visit | Attending: Internal Medicine | Admitting: Internal Medicine

## 2016-12-21 ENCOUNTER — Encounter: Payer: Self-pay | Admitting: Internal Medicine

## 2016-12-21 VITALS — BP 120/74 | HR 66 | Temp 97.8°F | Ht 64.0 in | Wt 140.0 lb

## 2016-12-21 DIAGNOSIS — L989 Disorder of the skin and subcutaneous tissue, unspecified: Secondary | ICD-10-CM | POA: Diagnosis not present

## 2016-12-21 DIAGNOSIS — J439 Emphysema, unspecified: Secondary | ICD-10-CM | POA: Diagnosis not present

## 2016-12-21 DIAGNOSIS — R509 Fever, unspecified: Secondary | ICD-10-CM | POA: Diagnosis not present

## 2016-12-21 DIAGNOSIS — R042 Hemoptysis: Secondary | ICD-10-CM | POA: Diagnosis not present

## 2016-12-21 DIAGNOSIS — J989 Respiratory disorder, unspecified: Secondary | ICD-10-CM

## 2016-12-21 NOTE — Patient Instructions (Addendum)
Exam is  Reassuring.  But get chest x ray  As discussed .   If ongoing let us know  If not resolving .   [place]   Is good for  [facility] derm  [address] [address] [place], [place] [address] [phone]

## 2016-12-28 ENCOUNTER — Other Ambulatory Visit: Payer: Self-pay | Admitting: Obstetrics & Gynecology

## 2017-07-14 IMAGING — DX DG CHEST 2V
2 series · 2 of 2 positions shown · non-contrast
Comparison: 05/03/2016

CLINICAL DATA: 70-year-old female with a history of flu and
hemoptysis

EXAM:
CHEST  2 VIEW

[chest pa]
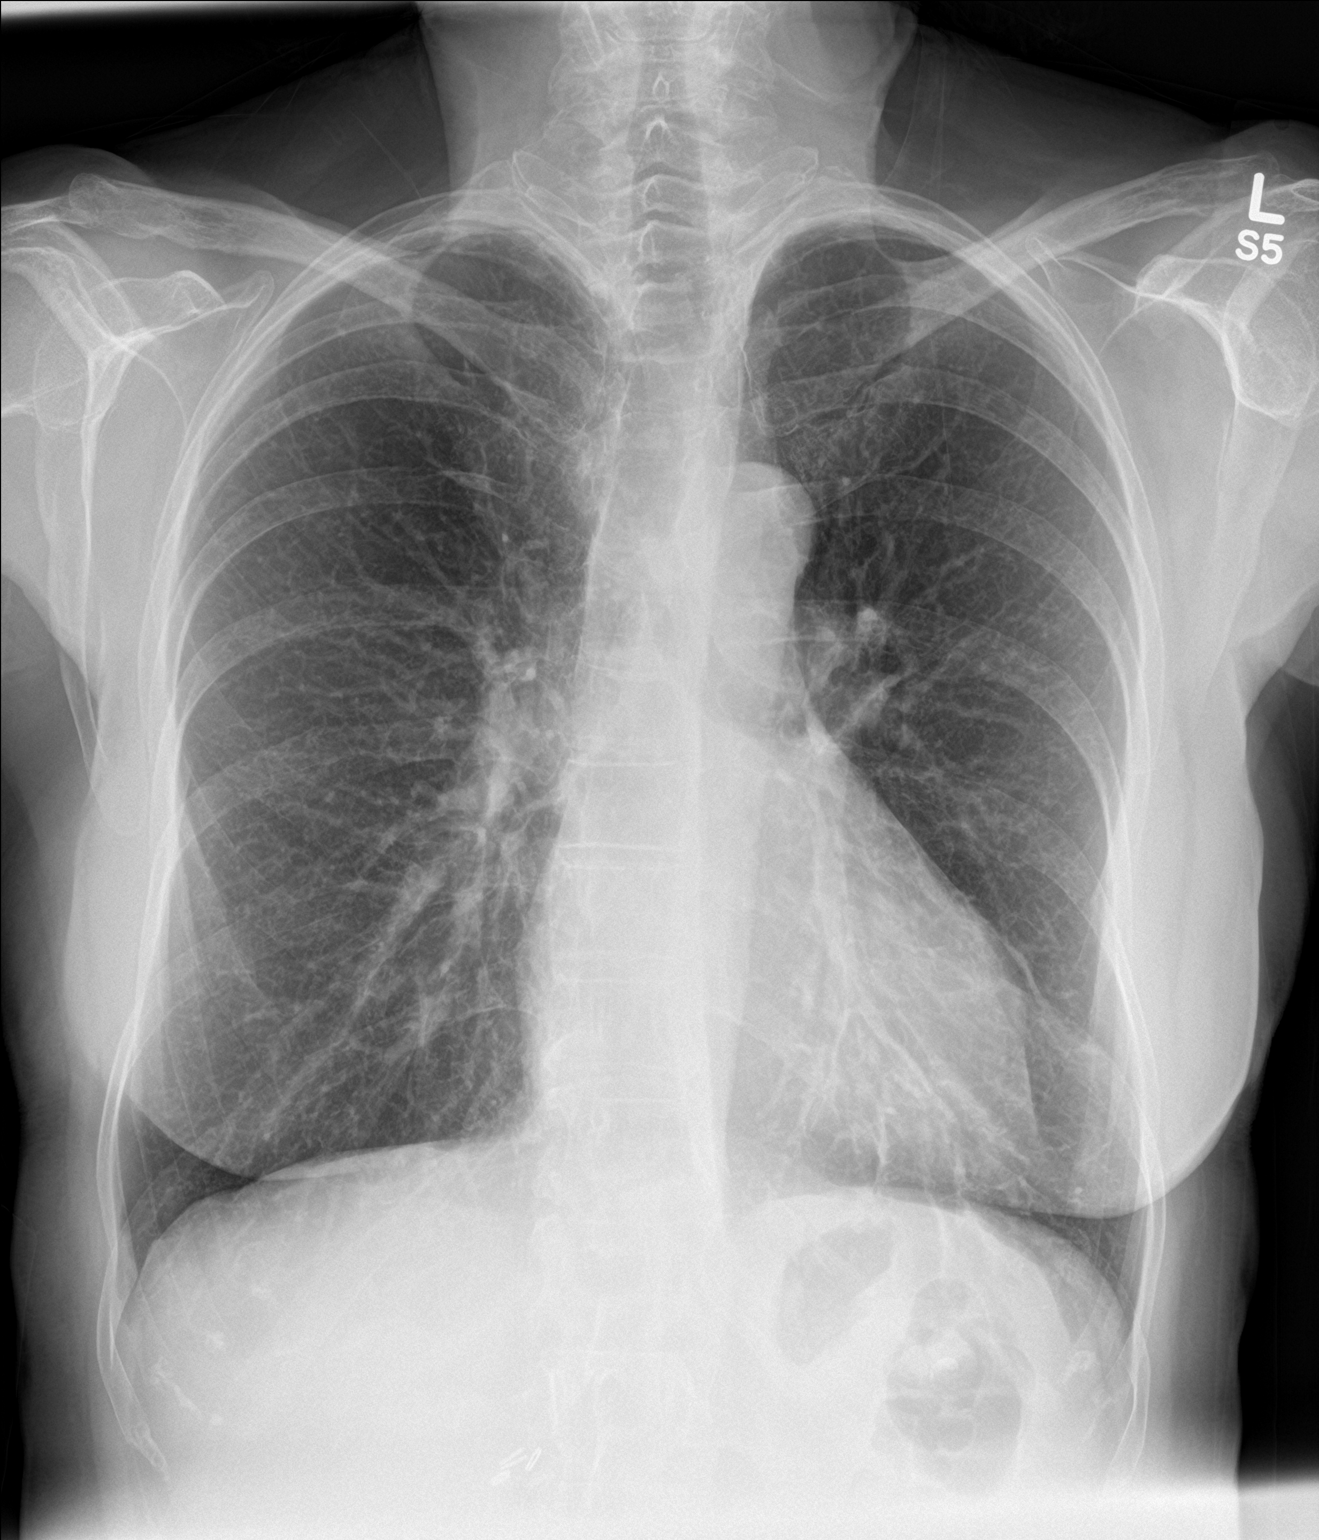

[chest lat]
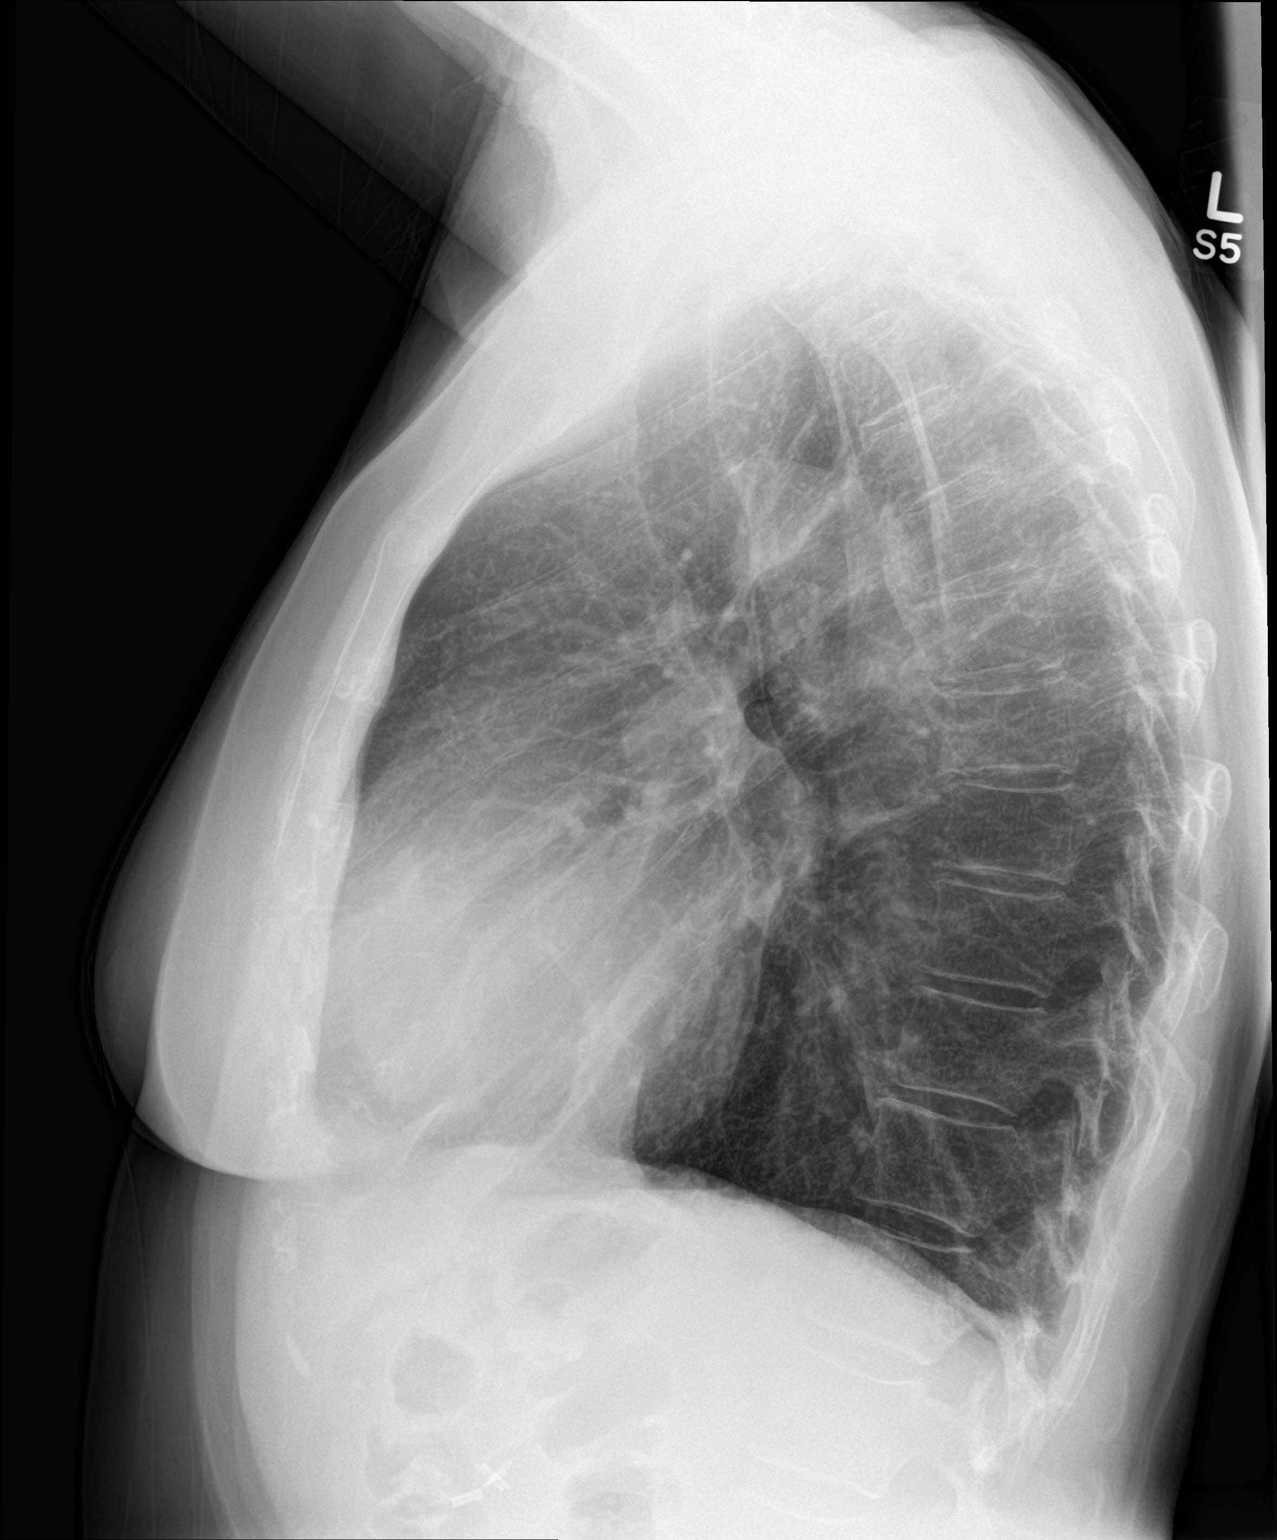

[2 of 2 positions shown; findings below may reference images not displayed]

FINDINGS: Cardiomediastinal silhouette unchanged in size and contour.

Calcifications of the aortic arch.

Stigmata of emphysema, with increased retrosternal airspace,
flattened hemidiaphragms, increased AP diameter, and hyperinflation
on the AP view.

No confluent airspace disease. Similar appearance of coarsened
interstitial markings. No pleural effusion or pneumothorax.

No displaced fracture.  Degenerative changes of the spine.
IMPRESSION: Background chronic lung changes and changes of emphysema, with no
evidence of superimposed acute cardiopulmonary disease.

Aortic atherosclerosis

## 2017-07-26 ENCOUNTER — Telehealth: Payer: Self-pay | Admitting: Obstetrics & Gynecology

## 2017-07-26 NOTE — Telephone Encounter (Signed)
Patient has some questions about bone density and when she should get one.

## 2017-07-26 NOTE — Telephone Encounter (Signed)
Order faxed to [facility].   Routing to provider for final review. Patient is agreeable to disposition. Will close encounter.

## 2017-07-26 NOTE — Telephone Encounter (Signed)
Spoke with patient. Advised last BMD 06/05/12, per OV notes dated 10/08/16 BMD recommended next year with MMG. Next MMG due 07/2017. Advised patient would fax order to [facility] for BMD, patient will call to schedule.   Order to Dr. Sabra Heck for signature.

## 2017-08-05 DIAGNOSIS — M8589 Other specified disorders of bone density and structure, multiple sites: Secondary | ICD-10-CM | POA: Diagnosis not present

## 2017-08-05 DIAGNOSIS — Z1231 Encounter for screening mammogram for malignant neoplasm of breast: Secondary | ICD-10-CM | POA: Diagnosis not present

## 2017-08-23 ENCOUNTER — Telehealth: Payer: Self-pay | Admitting: *Deleted

## 2017-08-23 NOTE — Telephone Encounter (Signed)
Call to patient. Patient notified of BMD results. See scanned report. Patient verbalized understanding.   Patient agreeable to disposition. Will close encounter.

## 2017-08-29 ENCOUNTER — Encounter: Payer: Self-pay | Admitting: Obstetrics & Gynecology

## 2017-09-12 DIAGNOSIS — Z23 Encounter for immunization: Secondary | ICD-10-CM | POA: Diagnosis not present

## 2017-10-03 ENCOUNTER — Telehealth: Payer: Self-pay | Admitting: Internal Medicine

## 2017-10-03 DIAGNOSIS — R413 Other amnesia: Secondary | ICD-10-CM

## 2017-10-03 DIAGNOSIS — G479 Sleep disorder, unspecified: Secondary | ICD-10-CM

## 2017-10-03 NOTE — Telephone Encounter (Signed)
° ° ° °  Pt cal lto say she need a referral to see Dr Asencion Partridge Dohmeier

## 2017-10-05 NOTE — Telephone Encounter (Signed)
Ok to refer  See last jan visit   Dx memory problems and sleep disturbance

## 2017-10-05 NOTE — Telephone Encounter (Signed)
Pt requesting referral to Dr Brett Fairy for Sleep issues and memory issues.  Pt states that this was discussed last visit and she was urged to schedule an appt.  Pt is considered a new patient there d/t not being seeing in several years.   Please advise Dr Regis Bill, thanks.

## 2017-10-06 NOTE — Telephone Encounter (Signed)
Referral placed, patient aware. Nothing further needed.

## 2017-11-05 ENCOUNTER — Other Ambulatory Visit: Payer: Self-pay | Admitting: Obstetrics & Gynecology

## 2017-11-08 NOTE — Telephone Encounter (Signed)
Medication refill request: Vitamin D Last AEX:  10/08/16 SM Next AEX: 01/24/18 Last MMG (if hormonal medication request): 08/03/16 BIRADS 1 negative;/density b Refill authorized: 10/14/16 #26 w/0 refills; today please advise

## 2017-11-17 ENCOUNTER — Encounter: Payer: Self-pay | Admitting: Neurology

## 2017-11-17 ENCOUNTER — Ambulatory Visit: Payer: 59 | Admitting: Neurology

## 2017-11-17 VITALS — BP 133/84 | HR 79 | Ht 64.0 in | Wt 147.0 lb

## 2017-11-17 DIAGNOSIS — R6889 Other general symptoms and signs: Secondary | ICD-10-CM

## 2017-11-17 DIAGNOSIS — F5102 Adjustment insomnia: Secondary | ICD-10-CM | POA: Insufficient documentation

## 2017-11-17 DIAGNOSIS — G3184 Mild cognitive impairment, so stated: Secondary | ICD-10-CM | POA: Diagnosis not present

## 2017-11-17 NOTE — Progress Notes (Signed)
SLEEP MEDICINE CLINIC   Provider:    , M D  Primary Care Physician:  Panosh, Wanda K, MD   Referring Provider: Panosh, Wanda K, MD    Chief Complaint  Patient presents with  . New Patient (Initial Visit)    pt alone rm 11. pt states having difficulty with memory issues, starting to mess up checkbook.     HPI:  Leslie Reed is a 71 y.o. female , seen here as in a referral from Dr. Panosh. She had seen me 5-6 year ago for memory. Had MRI - negative.  I have also followed Leslie Reed son Gabriel, here in the clinic.  Her concern today is not so much directed toward sleep, she is concerned about her progressive memory deficits.  He has mostly insomnia concerns.  She reports weight gain, daytime fatigue chest pain back pain, hearing loss and ringing in her ears, memory loss, headaches insomnia and restless legs the feeling of not getting enough sleep or not enough quality sleep.  She has a history of cholecystectomy 2 breast tumors surgically removed appendectomy, polypectomy during colonoscopy, she had 4 C-sections, and she is status post partial hysterectomy.  Her only medication at this time is vitamin D 50,000 units.   Chief complaint according to patient : Leslie Reed is concerned about memory deficits, attention deficits, and still has poor sleep and insomnia issues. She has a feeling that she cannot access her vocabulary, she also gave anecdotal evidence.  She met with friends all of them had watch the same movie but she could not recall the content of the story line.  She has difficulties remembering names of sports, she has difficulties recalling names of colleagues from 30 years ago that she would otherwise felt easy to recall.  She was usually putting herself on her photographic memory but she feels now that if she does not repeat certain information over and over or writes it down it would not be accessible a week later.  Immediate recall is not her problem - it is the  intermediate recall.  Sleep habits are as follows: Bedtime is between 9 and 11 PM for this patient earlier than 5 years ago.  She usually can sleep through the night for about 3-5 hours interrupted by a prolonged period of wakefulness sometimes 1-1/2 hours before she can continue to go back to sleep.  It is not a problem however for her to initiate sleep in the first place. It depends on her plans for the day as she is still working, she usually wakes up between 7 and 8 AM.  This would allow for 7 hours of sleep at night but not in 1 piece. She reads newspaper in the middle of the night. Often she falls asleep an finds herself covered under the newspaper. She reports no longer having vivid dreams, no longer snoring. Husband snores and she moved to a different bedroom. Some sleep gasping. No daytime naps.   Sleep medical history and family sleep history:  One son with apnea.   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  [address]. Lives with husband and adult son. No smoking, not ever. ETOH socially- wine 1-3 glasses a week.  Caffeine : decaffeinated 3-4 cups a day, no iced tea, hot tea, rarely soda.   Review of Systems: Out of a complete 14 system review, the patient complains of only the following symptoms, and all other reviewed systems are negative.     Epworth score 4 in daytime- 14 in PM.   , Fatigue severity score 38 ,  Geriatric depression score 3   Social History   Socioeconomic History  . Marital status: Married    Spouse name: Not on file  . Number of children: Not on file  . Years of education: Not on file  . Highest education level: Not on file  Social Needs  . Financial resource strain: Not on file  . Food insecurity - worry: Not on file  . Food insecurity - inability: Not on file  . Transportation needs - medical: Not on file  . Transportation needs - non-medical: Not on file  Occupational History  . Not on file    Tobacco Use  . Smoking status: Never Smoker  . Smokeless tobacco: Never Used  Substance and Sexual Activity  . Alcohol use: Yes    Comment: 3 or so time a week-wine or beer  . Drug use: No  . Sexual activity: Yes    Partners: Male    Birth control/protection: Surgical, Post-menopausal    Comment: TAH  Other Topics Concern  . 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   hhof 2   G6 p4    Pet cats    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    [address] 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   Neg td    Sleep 4-6 interrupted    Family is WISC father with alzheimers    Family History  Problem Relation Age of Onset  . Diabetes Mother   . Pulmonary fibrosis Mother        52 deceased  . Hypertension Mother   . Hypertension Father   . Alzheimer's disease Father   . Kidney failure Father   . Diabetes Maternal Grandmother   . Cancer Maternal Grandfather        ??  . Colon cancer Neg Hx   . Esophageal cancer Neg Hx   . Rectal cancer Neg Hx   . Stomach cancer Neg Hx     Past Medical History:  Diagnosis Date  . Allergy   . Arthritis    hands, lower back  . Bronchitis    uses inhaler prn  . Cataract    just being monitored - no surgery  . Colitis 8/04  . Diverticulitis   . GERD (gastroesophageal reflux disease)    diet controlled, no meds  . History of migraine headaches   . Hx of colonic polyp   . Hx of varicella   . Insomnia   . 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 Mitral valve prolapse   . Osteopenia   . Scoliosis 2005  . Tick bite 2014   STARI-antibiotics x 5 weeks    Past Surgical History:  Procedure Laterality Date  . ABDOMINAL HYSTERECTOMY     partial  for pain no cancer    . APPENDECTOMY    . BREAST BIOPSY Right    x 2   . CESAREAN SECTION     x4  . CHOLECYSTECTOMY    . colon polyps    . COLONOSCOPY    . DILATION AND CURETTAGE OF UTERUS     x 2  . WISDOM TOOTH EXTRACTION      Current Outpatient Medications  Medication Sig  Dispense Refill  . pseudoephedrine-acetaminophen (TYLENOL SINUS) 30-500 MG TABS Take 1 tablet by mouth as needed.    . Vitamin D, Ergocalciferol, (DRISDOL) 50000 units CAPS capsule TAKE ONE  CAPSULE BY MOUTH EVERY 2 WEEKS 26 capsule 0   No current facility-administered medications for this visit.     Allergies as of 11/17/2017 - Review Complete 11/17/2017  Allergen Reaction Noted  . Pramoxine hcl      Vitals: BP 133/84   Pulse 79   Ht 5' 4" (1.626 m)   Wt 147 lb (66.7 kg)   LMP 11/29/1986   BMI 25.23 kg/m  Last Weight:  Wt Readings from Last 1 Encounters:  11/17/17 147 lb (66.7 kg)   OZD:GUYQ mass index is 25.23 kg/m.     Last Height:   Ht Readings from Last 1 Encounters:  11/17/17 5' 4" (1.626 m)    Physical exam:  General: The patient is awake, alert and appears not in acute distress. The patient is well groomed. Head: Normocephalic, atraumatic. Neck is supple. Mallampati 2,  neck circumference:14. Nasal airflow patent ,   Retrognathia is mild .  Cardiovascular:  Regular rate and rhythm - without  murmurs or carotid bruit, and without distended neck veins. Respiratory: Lungs are clear to auscultation. Skin:  Without evidence of edema, or rash Trunk: BMI is 25. The patient's posture is erect  Neurologic exam : The patient is awake and alert, oriented to place and time.   Memory subjective  described as impaired ,   MOCA: [facility] Cognitive Assessment  11/17/2017  Visuospatial/ Executive (0/5) 5  Naming (0/3) 3  Attention: Read list of digits (0/2) 2  Attention: Read list of letters (0/1) 1  Attention: Serial 7 subtraction starting at 100 (0/3) 3  Language: Repeat phrase (0/2) 2  Language : Fluency (0/1) 1  Abstraction (0/2) 2  Delayed Recall (0/5) 2  Orientation (0/6) 6  Total 27     Attention span & concentration ability appears normal.  Speech is fluent,  without  dysarthria, dysphonia or aphasia.  Mood and affect are appropriate.  Cranial  nerves: Pupils are equal and briskly reactive to light. Funduscopic exam without evidence of pallor or edema. Extraocular movements  in vertical and horizontal planes intact and without nystagmus. Visual fields by finger perimetry are intact. Hearing to finger rub intact.  Facial sensation intact to fine touch. Facial motor strength is symmetric and tongue and uvula move midline. Shoulder shrug was symmetrical. Motor exam:  Normal tone, muscle bulk and symmetric strength in all extremities.  Sensory:  Fine touch, pinprick and vibration were tested in all extremities.  Proprioception tested in the upper extremities was normal. Coordination: Rapid alternating movements in the fingers/hands was normal, without evidence of ataxia, dysmetria or tremor. Gait and station: Patient walks without assistive device .Tandem gait is unfragmented. Turns with  3 Steps. Romberg testing is negative. Deep tendon reflexes: in the  upper and lower extremities are symmetric and intact.   Assessment:  After physical and neurologic examination, review of laboratory studies,  Personal review of imaging studies, reports of other /same  Imaging studies, results of polysomnography and / or neurophysiology testing and pre-existing records as far as provided in visit., my assessment is   1) subjective mild cognitive impairment which could not be verified by a Montreal cognitive assessment test.  She did have trouble with delayed recall remembering only 2 of 5 words but was fully oriented, could name all the animals, and had normal visual spatial test results.  27 out of 30 points does not place her in any category of cognitive decline or dementia.  However her subjective observation is concerning for a beginning  decline of cognitive function especially the delayed recall.  At this time I would consider that age related and not diagnostic.   2) " Insomnia"  the patient gets a good number of sleep hours each night but her sleep is  interrupted with morning.  Of wakefulness that lasts an hour or even 90 minutes.  I think she has arranged herself around this phenomenon, it does truly not cause her to be sleep deprived and it seems not to affect the overall quality of sleep. I do not mind that she start reading and goes to another room out of her bedroom when she has trouble sleeping through the night, and sometimes she puts the TV on just for white noise in the background.  I may suggest that she try some audiobooks of relaxation tapes so that she does not have screen light interfering with her melatonin production.  I also would like for her to try melatonin at bedtime at 5 mg or less.  I would like her to cut out caffeine after 3 PM.   The patient was advised of the nature of the diagnosed disorder , the treatment options and the  risks for general health and wellness arising from not treating the condition.   I spent more than 50 minutes of face to face time with the patient.  Greater than 50% of time was spent in counseling and coordination of care. We have discussed the diagnosis and differential and I answered the patient's questions.    Plan:  Treatment plan and additional workup :  HST , rule out apnea.  Melatonin 2-5 mg at night.  Memory is affected, subjective MCI- follow with MOCA on a yearly basis.        , MD 11/17/2017, 11:41 AM  Certified in Neurology by ABPN Certified in Sleep Medicine by ABSM  [facility] [address], [address] [place], [place] [address]           

## 2017-11-17 NOTE — Patient Instructions (Signed)

## 2017-11-17 NOTE — Addendum Note (Signed)
Addended by: Larey Seat on: 11/17/2017 12:07 PM   Modules accepted: Orders

## 2017-12-05 ENCOUNTER — Other Ambulatory Visit: Payer: Commercial Managed Care - HMO

## 2017-12-07 ENCOUNTER — Ambulatory Visit (INDEPENDENT_AMBULATORY_CARE_PROVIDER_SITE_OTHER): Payer: 59 | Admitting: Neurology

## 2017-12-07 DIAGNOSIS — G4710 Hypersomnia, unspecified: Secondary | ICD-10-CM

## 2017-12-07 DIAGNOSIS — G3184 Mild cognitive impairment, so stated: Secondary | ICD-10-CM

## 2017-12-07 DIAGNOSIS — F5102 Adjustment insomnia: Secondary | ICD-10-CM

## 2017-12-07 DIAGNOSIS — R6889 Other general symptoms and signs: Secondary | ICD-10-CM
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
## 2017-12-12 ENCOUNTER — Encounter: Payer: Self-pay | Admitting: Internal Medicine

## 2017-12-12 ENCOUNTER — Other Ambulatory Visit: Payer: Self-pay | Admitting: Neurology

## 2017-12-12 ENCOUNTER — Ambulatory Visit (INDEPENDENT_AMBULATORY_CARE_PROVIDER_SITE_OTHER): Payer: 59 | Admitting: Internal Medicine

## 2017-12-12 VITALS — BP 116/74 | HR 74 | Temp 98.4°F | Ht 64.0 in | Wt 146.2 lb

## 2017-12-12 DIAGNOSIS — E785 Hyperlipidemia, unspecified: Secondary | ICD-10-CM

## 2017-12-12 DIAGNOSIS — M542 Cervicalgia: Secondary | ICD-10-CM

## 2017-12-12 DIAGNOSIS — G4701 Insomnia due to medical condition: Secondary | ICD-10-CM

## 2017-12-12 DIAGNOSIS — M546 Pain in thoracic spine: Secondary | ICD-10-CM

## 2017-12-12 DIAGNOSIS — R413 Other amnesia: Secondary | ICD-10-CM

## 2017-12-12 DIAGNOSIS — R6889 Other general symptoms and signs: Secondary | ICD-10-CM

## 2017-12-12 DIAGNOSIS — E559 Vitamin D deficiency, unspecified: Secondary | ICD-10-CM | POA: Diagnosis not present

## 2017-12-12 DIAGNOSIS — Z0000 Encounter for general adult medical examination without abnormal findings: Secondary | ICD-10-CM

## 2017-12-12 DIAGNOSIS — I700 Atherosclerosis of aorta: Secondary | ICD-10-CM | POA: Diagnosis not present

## 2017-12-12 LAB — CBC WITH DIFFERENTIAL/PLATELET
BASOS ABS: 0 10*3/uL (ref 0.0–0.1)
BASOS PCT: 0.6 % (ref 0.0–3.0)
Eosinophils Absolute: 0.1 10*3/uL (ref 0.0–0.7)
Eosinophils Relative: 1.4 % (ref 0.0–5.0)
HCT: 42 % (ref 36.0–46.0)
Hemoglobin: 13.9 g/dL (ref 12.0–15.0)
LYMPHS PCT: 24.5 % (ref 12.0–46.0)
Lymphs Abs: 1.3 10*3/uL (ref 0.7–4.0)
MCHC: 33.1 g/dL (ref 30.0–36.0)
MCV: 92.6 fl (ref 78.0–100.0)
MONOS PCT: 9.6 % (ref 3.0–12.0)
Monocytes Absolute: 0.5 10*3/uL (ref 0.1–1.0)
NEUTROS ABS: 3.4 10*3/uL (ref 1.4–7.7)
Neutrophils Relative %: 63.9 % (ref 43.0–77.0)
PLATELETS: 287 10*3/uL (ref 150.0–400.0)
RBC: 4.53 Mil/uL (ref 3.87–5.11)
RDW: 13.8 % (ref 11.5–15.5)
WBC: 5.3 10*3/uL (ref 4.0–10.5)

## 2017-12-12 LAB — HEPATIC FUNCTION PANEL
ALK PHOS: 71 U/L (ref 39–117)
ALT: 14 U/L (ref 0–35)
AST: 14 U/L (ref 0–37)
Albumin: 4.6 g/dL (ref 3.5–5.2)
BILIRUBIN TOTAL: 0.5 mg/dL (ref 0.2–1.2)
Bilirubin, Direct: 0.1 mg/dL (ref 0.0–0.3)
Total Protein: 6.6 g/dL (ref 6.0–8.3)

## 2017-12-12 LAB — BASIC METABOLIC PANEL
BUN: 16 mg/dL (ref 6–23)
CHLORIDE: 105 meq/L (ref 96–112)
CO2: 30 mEq/L (ref 19–32)
CREATININE: 0.69 mg/dL (ref 0.40–1.20)
Calcium: 9.4 mg/dL (ref 8.4–10.5)
GFR: 89.21 mL/min (ref >60.00)
Glucose, Bld: 87 mg/dL (ref 70–99)
POTASSIUM: 4.7 meq/L (ref 3.5–5.1)
Sodium: 142 mEq/L (ref 135–145)

## 2017-12-12 LAB — LIPID PANEL
CHOLESTEROL: 185 mg/dL (ref 0–200)
HDL: 52 mg/dL (ref >39.00)
LDL CALC: 109 mg/dL — AB (ref 0–99)
NonHDL: 132.7
TRIGLYCERIDES: 121 mg/dL (ref 0.0–149.0)
Total CHOL/HDL Ratio: 4
VLDL: 24.2 mg/dL (ref 0.0–40.0)

## 2017-12-12 LAB — VITAMIN D 25 HYDROXY (VIT D DEFICIENCY, FRACTURES): VITD: 24.1 ng/mL — AB (ref 30.00–100.00)

## 2017-12-12 LAB — TSH: TSH: 1.54 u[IU]/mL (ref 0.35–4.50)

## 2017-12-12 NOTE — Patient Instructions (Addendum)
Will do a referral to  PT   For neck and  Right back .     weight bearing exercise   Can be helpful  For bone health.   Will check a vit D.    We may advise adding a statin medication  Because of plaque buildup   Will let you know  After labs back        Preventive Care 65 Years and Older, Female Preventive care refers to lifestyle choices and visits with your health care provider that can promote health and wellness. What does preventive care include?  A yearly physical exam. This is also called an annual well check.  Dental exams once or twice a year.  Routine eye exams. Ask your health care provider how often you should have your eyes checked.  Personal lifestyle choices, including: ? Daily care of your teeth and gums. ? Regular physical activity. ? Eating a healthy diet. ? Avoiding tobacco and drug use. ? Limiting alcohol use. ? Practicing safe sex. ? Taking low-dose aspirin every day. ? Taking vitamin and mineral supplements as recommended by your health care provider. What happens during an annual well check? The services and screenings done by your health care provider during your annual well check will depend on your age, overall health, lifestyle risk factors, and family history of disease. Counseling Your health care provider may ask you questions about your:  Alcohol use.  Tobacco use.  Drug use.  Emotional well-being.  Home and relationship well-being.  Sexual activity.  Eating habits.  History of falls.  Memory and ability to understand (cognition).  Work and work Statistician.  Reproductive health.  Screening You may have the following tests or measurements:  Height, weight, and BMI.  Blood pressure.  Lipid and cholesterol levels. These may be checked every 5 years, or more frequently if you are over 49 years old.  Skin check.  Lung cancer screening. You may have this screening every year starting at age 41 if you have a  30-pack-year history of smoking and currently smoke or have quit within the past 15 years.  Fecal occult blood test (FOBT) of the stool. You may have this test every year starting at age 62.  Flexible sigmoidoscopy or colonoscopy. You may have a sigmoidoscopy every 5 years or a colonoscopy every 10 years starting at age 33.  Hepatitis C blood test.  Hepatitis B blood test.  Sexually transmitted disease (STD) testing.  Diabetes screening. This is done by checking your blood sugar (glucose) after you have not eaten for a while (fasting). You may have this done every 1-3 years.  Bone density scan. This is done to screen for osteoporosis. You may have this done starting at age 74.  Mammogram. This may be done every 1-2 years. Talk to your health care provider about how often you should have regular mammograms.  Talk with your health care provider about your test results, treatment options, and if necessary, the need for more tests. Vaccines Your health care provider may recommend certain vaccines, such as:  Influenza vaccine. This is recommended every year.  Tetanus, diphtheria, and acellular pertussis (Tdap, Td) vaccine. You may need a Td booster every 10 years.  Varicella vaccine. You may need this if you have not been vaccinated.  Zoster vaccine. You may need this after age 27.  Measles, mumps, and rubella (MMR) vaccine. You may need at least one dose of MMR if you were born in 1957 or later. You  may also need a second dose.  Pneumococcal 13-valent conjugate (PCV13) vaccine. One dose is recommended after age 82.  Pneumococcal polysaccharide (PPSV23) vaccine. One dose is recommended after age 4.  Meningococcal vaccine. You may need this if you have certain conditions.  Hepatitis A vaccine. You may need this if you have certain conditions or if you travel or work in places where you may be exposed to hepatitis A.  Hepatitis B vaccine. You may need this if you have certain  conditions or if you travel or work in places where you may be exposed to hepatitis B.  Haemophilus influenzae type b (Hib) vaccine. You may need this if you have certain conditions.  Talk to your health care provider about which screenings and vaccines you need and how often you need them. This information is not intended to replace advice given to you by your health care provider. Make sure you discuss any questions you have with your health care provider. Document Released: 12/12/2015 Document Revised: 08/04/2016 Document Reviewed: 09/16/2015 Elsevier Interactive Patient Education  [employer].

## 2017-12-12 NOTE — Procedures (Signed)
[facility] [address] [place], [place] [address] NAME:  Leslie Reed. Leslie Reed                                                        DOB: 1946-01-02 MEDICAL RECORD NUMBER [id number]                                              DOS: 12/07/2017 REFERRING PHYSICIAN: Shanon Ace, M.D. STUDY PERFORMED: Home Sleep test by Gweneth Dimitri. HISTORY: Leslie Reed is a 72 y.o. female patient, whom I have seen 5-6 years ago for memory problems. Brain MRI was negative. Chief complaint according to patient: Leslie Reed is concerned about memory deficits, attention deficits, and still has poor sleep and insomnia issues. She reports weight gain, daytime fatigue, chest pain, back pain, hearing loss and ringing in her ears, memory loss, headaches, insomnia, and restless legs- the feeling of not getting enough sleep or not enough quality sleep. I have also followed Leslie Reed son Leslie Reed for a sleep disorder. She has a history of cholecystectomy, 2 breast tumor surgeries, appendectomy, polypectomy at colonoscopy, had 4 C-sections, and she is status post partial hysterectomy.  Her only medication at this time is vitamin D 50,000 units.     Epworth Sleepiness Score endorsed at 4 points in daytime- and at 14 in PM.  Fatigue severity score at 38 points, the Geriatric depression score at 3 points. BMI is 25.2.    STUDY RESULTS: Total Recording Time: 8 hours, 26 minutes Total Apnea/Hypopnea Index (AHI): 7.4 /hr. RDI was 8.6/hr.  Average Oxygen Saturation:  93 %, Lowest Oxygen Desaturation: 88%  Total Time Oxygen Saturation below 89% Sp02:  0.0 min.  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 Snoring is noted ( mild to moderate)  RECOMMENDATION: CPAP would not be recommended. Insomnia cannot be verified by a HST.  To evaluate the sleep architecture and frequency of sleep fragmentation an attended sleep study  is needed.  I certify that I have reviewed the raw data recording prior to the issuance of this report in accordance with the standards of the American Academy of Sleep Medicine (AASM). Larey Seat, M.D.        12-12-2017  Medical Director of [facility] at [facility], [place] of the ABPN and ABSM, accredited by the AASM.

## 2017-12-14 ENCOUNTER — Telehealth: Payer: Self-pay | Admitting: Neurology

## 2017-12-14 NOTE — Telephone Encounter (Signed)
Called the patient and went over the sleep study results. The pt is agreeable to coming in to the sleep lab for a overnight sleep study as she felt that nights sleep test wasn't a accurate presentation of a normal nights sleep. Pt will wait to hear from the sleep lab to get set up for this test. Pt verbalized understanding. Pt had no questions at this time but was encouraged to call back if questions arise.

## 2017-12-14 NOTE — Telephone Encounter (Signed)
-----   Message from Larey Seat, MD sent at 12/12/2017  9:19 AM EST ----- IMPRESSION: Very mild sleep apnea without hypoxemia and with  intermittent bradycardia. Snoring is noted ( mild to moderate)  RECOMMENDATION: CPAP would not be recommended. Insomnia cannot be  verified by a HST.  To evaluate the sleep architecture and frequency of sleep  fragmentation an attended sleep study is needed. I recommend a sleep study with REM montage to evaluate further.

## 2017-12-15 ENCOUNTER — Telehealth: Payer: Self-pay

## 2017-12-15 ENCOUNTER — Other Ambulatory Visit: Payer: Self-pay | Admitting: Neurology

## 2017-12-15 DIAGNOSIS — G4733 Obstructive sleep apnea (adult) (pediatric): Secondary | ICD-10-CM

## 2017-12-15 NOTE — Telephone Encounter (Signed)
I have contacted the patient and made her aware that insurance is not covering her to come in and do a sleep study. I informed the patient that she meets the criteria to start CPAP and if she fails the auto cpap then at that time the patient can be brought in for a in lab study. Pt verbalized understanding.  Pt is agreeable to starting a CPAP [facility] will call the pt within about one week after they file with the pt's insurance. [facility] will show the pt how to use the machine, fit for masks, and troubleshoot the CPAP if needed. A follow up appt was made for insurance purposes with Cecille Rubin, NP on March 15, 2018 at 8:45 am. Pt verbalized understanding to arrive 15 minutes early and bring their CPAP. A letter with all of this information in it will be mailed to the pt as a reminder. I verified with the pt that the address we have on file is correct. Pt verbalized understanding of results. Pt had no questions at this time but was encouraged to call back if questions arise.

## 2017-12-15 NOTE — Telephone Encounter (Signed)
Spoke with peer-peer nurse at [facility] and because her home study was successful and it showed an AHI of 7.4, she has to be started on auto cpap and if she fails that they will approve an in lab study. They view this a completed home study. A PSG is denied.

## 2017-12-15 NOTE — Telephone Encounter (Signed)
Place auto CPAP 5-12 cm water, 3 cm EPR and mask of choice. Thank You

## 2017-12-18 ENCOUNTER — Other Ambulatory Visit: Payer: Self-pay | Admitting: Obstetrics & Gynecology

## 2017-12-20 ENCOUNTER — Telehealth: Payer: Self-pay | Admitting: Obstetrics & Gynecology

## 2017-12-20 MED ORDER — VITAMIN D (ERGOCALCIFEROL) 1.25 MG (50000 UNIT) PO CAPS: ORAL_CAPSULE | ORAL | 11 refills | Status: DC

## 2017-12-20 NOTE — Telephone Encounter (Signed)
Patient says her insurance company declined her vitamin d prescription.

## 2017-12-20 NOTE — Telephone Encounter (Signed)
Spoke with patient. Patient states that her insurance will only let her pick up a one month supply of Vitamin D at a time. Rx for Vitamin D 50000 IU take 1 capsule every 2 weeks #26 was written on 11/09/2017. States she has only picked up 2 and now the pharmacy will not let her fill her rx. Advised will write rx for Vitamin D 50000 IU take 1 tablet every 2 weeks #2 11RF for her insurance processing. Patient will call back with any trouble processing.  Routing to provider for final review. Patient agreeable to disposition. Will close encounter.

## 2017-12-23 ENCOUNTER — Other Ambulatory Visit: Payer: Self-pay | Admitting: *Deleted

## 2017-12-23 DIAGNOSIS — E785 Hyperlipidemia, unspecified: Secondary | ICD-10-CM

## 2017-12-27 DIAGNOSIS — G4733 Obstructive sleep apnea (adult) (pediatric): Secondary | ICD-10-CM | POA: Diagnosis not present

## 2018-01-03 DIAGNOSIS — M546 Pain in thoracic spine: Secondary | ICD-10-CM | POA: Diagnosis not present

## 2018-01-03 DIAGNOSIS — M542 Cervicalgia: Secondary | ICD-10-CM | POA: Diagnosis not present

## 2018-01-05 ENCOUNTER — Other Ambulatory Visit: Payer: Self-pay | Admitting: Internal Medicine

## 2018-01-05 DIAGNOSIS — E785 Hyperlipidemia, unspecified: Secondary | ICD-10-CM

## 2018-01-05 MED ORDER — ROSUVASTATIN CALCIUM 10 MG PO TABS: 10.0000 mg | ORAL_TABLET | Freq: Every day | ORAL | 3 refills | Status: DC

## 2018-01-10 DIAGNOSIS — M546 Pain in thoracic spine: Secondary | ICD-10-CM | POA: Diagnosis not present

## 2018-01-10 DIAGNOSIS — M542 Cervicalgia: Secondary | ICD-10-CM | POA: Diagnosis not present

## 2018-01-13 DIAGNOSIS — M546 Pain in thoracic spine: Secondary | ICD-10-CM | POA: Diagnosis not present

## 2018-01-13 DIAGNOSIS — M542 Cervicalgia: Secondary | ICD-10-CM | POA: Diagnosis not present

## 2018-01-18 ENCOUNTER — Telehealth: Payer: Self-pay | Admitting: Neurology

## 2018-01-18 DIAGNOSIS — M542 Cervicalgia: Secondary | ICD-10-CM | POA: Diagnosis not present

## 2018-01-18 DIAGNOSIS — M546 Pain in thoracic spine: Secondary | ICD-10-CM | POA: Diagnosis not present

## 2018-01-18 NOTE — Telephone Encounter (Signed)
Pt called she is struggling with the face mask. She has tried 4 mask and not sure she can tolerate CPAP.  Please call to advise.

## 2018-01-18 NOTE — Telephone Encounter (Signed)
She may be a candidate for [employer]- please have her make a RV and send [place] a download on PAP>

## 2018-01-19 NOTE — Telephone Encounter (Signed)
Left message for patient to call sleep lab and schedule a mask fitting.

## 2018-01-19 NOTE — Telephone Encounter (Signed)
After looking at the download Dr Brett Fairy has discussed with Shirlean Mylar to see if the patient would be willing to come in and talk with Shirlean Mylar and work with the mask and do more education.

## 2018-01-23 DIAGNOSIS — M542 Cervicalgia: Secondary | ICD-10-CM | POA: Diagnosis not present

## 2018-01-23 DIAGNOSIS — M546 Pain in thoracic spine: Secondary | ICD-10-CM | POA: Diagnosis not present

## 2018-01-24 ENCOUNTER — Other Ambulatory Visit: Payer: Self-pay

## 2018-01-24 ENCOUNTER — Encounter: Payer: Self-pay | Admitting: Obstetrics & Gynecology

## 2018-01-24 ENCOUNTER — Ambulatory Visit: Payer: 59 | Admitting: Obstetrics & Gynecology

## 2018-01-24 VITALS — BP 116/70 | HR 76 | Resp 16 | Ht 64.0 in | Wt 146.0 lb

## 2018-01-24 DIAGNOSIS — Z0000 Encounter for general adult medical examination without abnormal findings: Secondary | ICD-10-CM | POA: Diagnosis not present

## 2018-01-24 DIAGNOSIS — Z01419 Encounter for gynecological examination (general) (routine) without abnormal findings: Secondary | ICD-10-CM

## 2018-01-24 LAB — POCT URINALYSIS DIPSTICK
Bilirubin, UA: NEGATIVE
Blood, UA: NEGATIVE
Glucose, UA: NEGATIVE
Ketones, UA: NEGATIVE
LEUKOCYTES UA: NEGATIVE
NITRITE UA: NEGATIVE
PH UA: 6 (ref 5.0–8.0)
Protein, UA: NEGATIVE
UROBILINOGEN UA: 0.2 U/dL

## 2018-01-24 NOTE — Progress Notes (Addendum)
72 y.o. [id number] MarriedCaucasianF here for annual exam.  Doing well.  Has committed to exercising more this year.  Has been concerned about memory concerns.  Has seen Dr. Brett Fairy.  She does have sleep apnea.  Having some issues with the mask with the sleep apnea machine.  Has really struggled with the mask.  Is just about ready to stop as this has really interfered with her sleep.  Has follow-up today.    Denies vaginal bleeding.    Patient's last menstrual period was 11/29/1986.          Sexually active: Yes.    The current method of family planning is status post hysterectomy.    Exercising: Yes.    physical work Smoker:  no  Health Maintenance: Pap:  2009 Normal  History of abnormal Pap:  no MMG:  08/05/17 BIRADS2:benign  Colonoscopy:  09/09/16 Normal. f/u 5 years  BMD:   08/05/17 Osteopenia  TDaP:  2014 Pneumonia vaccine(s):  2018 Shingrix: Zostavax done  Hep C testing: 10/08/16 neg  Screening Labs: PCP  JQ:[id number]    reports that  has never smoked. she has never used smokeless tobacco. She reports that she drinks alcohol. She reports that she does not use drugs.  Past Medical History:  Diagnosis Date  . Allergy   . Arthritis    hands, lower back  . Bronchitis    uses inhaler prn  . Cataract    just being monitored - no surgery  . Colitis 8/04  . Diverticulitis   . GERD (gastroesophageal reflux disease)    diet controlled, no meds  . History of migraine headaches   . Hx of colonic polyp   . Hx of varicella   . Insomnia   . 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 Mitral valve prolapse   . Osteopenia   . Scoliosis 2005  . Sleep apnea   . Tick bite 2014   STARI-antibiotics x 5 weeks    Past Surgical History:  Procedure Laterality Date  . ABDOMINAL HYSTERECTOMY     partial  for pain no cancer    . APPENDECTOMY    . BREAST BIOPSY Right    x 2   . CESAREAN SECTION     x4  . CHOLECYSTECTOMY    . colon polyps    . COLONOSCOPY    . DILATION AND CURETTAGE OF UTERUS     x  2  . WISDOM TOOTH EXTRACTION      Current Outpatient Medications  Medication Sig Dispense Refill  . pseudoephedrine-acetaminophen (TYLENOL SINUS) 30-500 MG TABS Take 1 tablet by mouth as needed.    . rosuvastatin (CRESTOR) 10 MG tablet Take 1 tablet (10 mg total) by mouth daily. 30 tablet 3  . Vitamin D, Ergocalciferol, (DRISDOL) 50000 units CAPS capsule TAKE ONE CAPSULE BY MOUTH EVERY 2 WEEKS. 2 capsule 11   No current facility-administered medications for this visit.     Family History  Problem Relation Age of Onset  . Diabetes Mother   . Pulmonary fibrosis Mother        32 deceased  . Hypertension Mother   . Hypertension Father   . Alzheimer's disease Father   . Kidney failure Father   . Diabetes Maternal Grandmother   . Cancer Maternal Grandfather        ??  . Colon cancer Neg Hx   . Esophageal cancer Neg Hx   . Rectal cancer Neg Hx   . Stomach cancer Neg Hx  ROS:  Pertinent items are noted in HPI.  Otherwise, a comprehensive ROS was negative.  Exam:   BP 116/70 (BP Location: Right Arm, Patient Position: Sitting, Cuff Size: Normal)   Pulse 76   Resp 16   Ht 5\' 4"  (1.626 m)   Wt 146 lb (66.2 kg)   LMP 11/29/1986   BMI 25.06 kg/m   Weight change: +7#   Height: 5\' 4"  (162.6 cm)  Ht Readings from Last 3 Encounters:  01/24/18 5\' 4"  (1.626 m)  12/12/17 5\' 4"  (1.626 m)  11/17/17 5\' 4"  (1.626 m)    General appearance: alert, cooperative and appears stated age Head: Normocephalic, without obvious abnormality, atraumatic Neck: no adenopathy, supple, symmetrical, trachea midline and thyroid normal to inspection and palpation Lungs: clear to auscultation bilaterally Breasts: normal appearance, no masses or tenderness Heart: regular rate and rhythm Abdomen: soft, non-tender; bowel sounds normal; no masses,  no organomegaly Extremities: extremities normal, atraumatic, no cyanosis or edema Skin: Skin color, texture, turgor normal. No rashes or lesions Lymph nodes:  Cervical, supraclavicular, and axillary nodes normal. No abnormal inguinal nodes palpated Neurologic: Grossly normal   Pelvic: External genitalia:  no lesions              Urethra:  normal appearing urethra with no masses, tenderness or lesions              Bartholins and Skenes: normal                 Vagina: normal appearing vagina with normal color and discharge, no lesions              Cervix: absent              Pap taken: No. Bimanual Exam:  Uterus:  uterus absent              Adnexa: no mass, fullness, tenderness               Rectovaginal: Confirms               Anus:  normal sphincter tone, no lesions  Chaperone was present for exam.  A:  Well Woman with normal exam PMP, no HRT Osteopenia MVP H/O TAH due to chronic pelvic pain 2/88 H/o colonic polyps.  Follow-up 5 years Vit D deficiency  P:   Mammogram guidelines reviewed.  Doing yearly. pap smear not indicated.  H/O TAH. Lab work up to date D/w pt Shingrix vaccine Will transition to OTC Vit D, 3000 IU daily Return annually or prn

## 2018-01-26 DIAGNOSIS — G4733 Obstructive sleep apnea (adult) (pediatric): Secondary | ICD-10-CM | POA: Diagnosis not present

## 2018-01-26 DIAGNOSIS — M542 Cervicalgia: Secondary | ICD-10-CM | POA: Diagnosis not present

## 2018-01-26 DIAGNOSIS — M546 Pain in thoracic spine: Secondary | ICD-10-CM | POA: Diagnosis not present

## 2018-01-30 ENCOUNTER — Encounter: Payer: Self-pay | Admitting: Obstetrics & Gynecology

## 2018-01-30 DIAGNOSIS — M546 Pain in thoracic spine: Secondary | ICD-10-CM | POA: Diagnosis not present

## 2018-01-30 DIAGNOSIS — M542 Cervicalgia: Secondary | ICD-10-CM | POA: Diagnosis not present

## 2018-02-03 DIAGNOSIS — M542 Cervicalgia: Secondary | ICD-10-CM | POA: Diagnosis not present

## 2018-02-03 DIAGNOSIS — M546 Pain in thoracic spine: Secondary | ICD-10-CM | POA: Diagnosis not present

## 2018-02-15 DIAGNOSIS — M542 Cervicalgia: Secondary | ICD-10-CM | POA: Diagnosis not present

## 2018-02-15 DIAGNOSIS — M546 Pain in thoracic spine: Secondary | ICD-10-CM | POA: Diagnosis not present

## 2018-02-17 ENCOUNTER — Telehealth: Payer: Self-pay

## 2018-02-17 NOTE — Telephone Encounter (Signed)
Patient came for mask fitting. I had given her many mask to try and she still struggles with wearing machine. I fitted her with a dreamwear nasal mask and she will try this . She sees Radio producer at end of April. She will discuss whether she needs another sleep study to confirm degree of apnea. Her insurance should approve considering her struggle with cpap. Or discuss a mouth guard.

## 2018-02-21 DIAGNOSIS — M542 Cervicalgia: Secondary | ICD-10-CM | POA: Diagnosis not present

## 2018-02-21 DIAGNOSIS — M546 Pain in thoracic spine: Secondary | ICD-10-CM | POA: Diagnosis not present

## 2018-02-24 DIAGNOSIS — G4733 Obstructive sleep apnea (adult) (pediatric): Secondary | ICD-10-CM | POA: Diagnosis not present

## 2018-03-09 ENCOUNTER — Other Ambulatory Visit (INDEPENDENT_AMBULATORY_CARE_PROVIDER_SITE_OTHER): Payer: 59

## 2018-03-09 DIAGNOSIS — E785 Hyperlipidemia, unspecified: Secondary | ICD-10-CM

## 2018-03-09 LAB — LIPID PANEL
CHOL/HDL RATIO: 2
Cholesterol: 125 mg/dL (ref 0–200)
HDL: 51.6 mg/dL (ref >39.00)
LDL Cholesterol: 61 mg/dL (ref 0–99)
NONHDL: 73.06
Triglycerides: 59 mg/dL (ref 0.0–149.0)
VLDL: 11.8 mg/dL (ref 0.0–40.0)

## 2018-03-13 ENCOUNTER — Encounter: Payer: Self-pay | Admitting: Nurse Practitioner

## 2018-03-13 ENCOUNTER — Other Ambulatory Visit: Payer: Self-pay | Admitting: Internal Medicine

## 2018-03-13 ENCOUNTER — Telehealth: Payer: Self-pay | Admitting: Internal Medicine

## 2018-03-13 ENCOUNTER — Other Ambulatory Visit: Payer: 59

## 2018-03-13 NOTE — Telephone Encounter (Signed)
Per Dr Regis Bill, okay to continue taking Crestor as directed until next follow up.   Pt aware. Nothing further needed.

## 2018-03-13 NOTE — Telephone Encounter (Signed)
Patient has one pill left- does she need to continue? Patient is awaiting her results for further refills.

## 2018-03-13 NOTE — Telephone Encounter (Signed)
Copied from [place] [phone]. Topic: Quick Communication - Rx Refill/Question >> Mar 13, 2018 11:37 AM Oliver Pila B wrote: Medication: rosuvastatin (CRESTOR) 10 MG tablet [[id number]]  Pt called to ask if she is going to have changes to this medication or if she is needing to stay on this medication, pt calling to check since she did labs last week and checking the status

## 2018-03-14 NOTE — Progress Notes (Signed)
[facility]  PATIENT: Leslie Reed DOB: 30-Nov-1945   REASON FOR VISIT: Follow-up for newly diagnosed mild obstructive sleep apnea here for initial CPAP  HISTORY FROM: Patient   HISTORY OF PRESENT ILLNESS:UPDATE 4/17/2019CM Leslie Reed, 72 year old female returns for follow-up for initial CPAP compliance.  She claims she hates the CPAP but she continues to use it.  She also states she has been through at least 9 different mask to try to get one that is comfortable for her.  She has had several days where she had a bad cold and did not use the machine.  CPAP data dated 02/12/2018-03/13/2018 shows greater than 4 hours for 25 days at 83%.  Average usage 5 hours 20 minutes.  Set pressure 5-12 cm EPR 3.  AHI 6.5 she returns for reevaluation   11/17/17 [name] is a 72 y.o. female , seen here as in a referral from Dr. Regis Bill. She had seen me 5-6 year ago for memory. Had MRI - negative.  I have also followed Leslie Reed son Leslie Reed, here in the clinic.  Her concern today is not so much directed toward sleep, she is concerned about her progressive memory deficits.  He has mostly insomnia concerns.  She reports weight gain, daytime fatigue chest pain back pain, hearing loss and ringing in her ears, memory loss, headaches insomnia and restless legs the feeling of not getting enough sleep or not enough quality sleep.  She has a history of cholecystectomy 2 breast tumors surgically removed appendectomy, polypectomy during colonoscopy, she had 4 C-sections, and she is status post partial hysterectomy.  Her only medication at this time is vitamin D 50,000 units.   Chief complaint according to patient : Leslie Reed is concerned about memory deficits, attention deficits, and still has poor sleep and insomnia issues. She has a feeling that she cannot access her vocabulary, she also gave anecdotal evidence.  She met with friends all of them had watch the same movie but she could not  recall the content of the story line.  She has difficulties remembering names of sports, she has difficulties recalling names of colleagues from 30 years ago that she would otherwise felt easy to recall.  She was usually putting herself on her photographic memory but she feels now that if she does not repeat certain information over and over or writes it Reed it would not be accessible a week later.  Immediate recall is not her problem - it is the intermediate recall.  Sleep habits are as follows: Bedtime is between 9 and 11 PM for this patient earlier than 5 years ago.  She usually can sleep through the night for about 3-5 hours interrupted by a prolonged period of wakefulness sometimes 1-1/2 hours before she can continue to go back to sleep.  It is not a problem however for her to initiate sleep in the first place. It depends on her plans for the day as she is still working, she usually wakes up between 7 and 8 AM.  This would allow for 7 hours of sleep at night but not in 1 piece. She reads newspaper in the middle of the night. Often she falls asleep an finds herself covered under the newspaper. She reports no longer having vivid dreams, no longer snoring. Husband snores and she moved to a different bedroom. Some sleep gasping. No daytime naps.     REVIEW OF SYSTEMS: Full 14 system review of systems performed and notable only for those  listed, all others are neg:  Constitutional: Fatigue Cardiovascular: neg Ear/Nose/Throat: Hearing loss Skin: neg Eyes: neg Respiratory: neg Gastroitestinal: neg  Hematology/Lymphatic: neg  Endocrine: neg Musculoskeletal:neg Allergy/Immunology: neg Neurological: Memory loss headaches Psychiatric: neg Sleep : Obstructive sleep apnea with CPAP   ALLERGIES: Allergies  Allergen Reactions  . Pramoxine Hcl     REACTION: irritation on rectal area    HOME MEDICATIONS: Outpatient Medications Prior to Visit  Medication Sig Dispense Refill  .  pseudoephedrine-acetaminophen (TYLENOL SINUS) 30-500 MG TABS Take 1 tablet by mouth as needed.    . rosuvastatin (CRESTOR) 10 MG tablet Take 1 tablet (10 mg total) by mouth daily. 30 tablet 3  . Vitamin D, Ergocalciferol, (DRISDOL) 50000 units CAPS capsule TAKE ONE CAPSULE BY MOUTH EVERY 2 WEEKS. 2 capsule 11   No facility-administered medications prior to visit.     PAST MEDICAL HISTORY: Past Medical History:  Diagnosis Date  . Allergy   . Arthritis    hands, lower back  . Bronchitis    uses inhaler prn  . Cataract    just being monitored - no surgery  . Colitis 8/04  . Diverticulitis   . GERD (gastroesophageal reflux disease)    diet controlled, no meds  . History of migraine headaches   . Hx of colonic polyp   . Hx of varicella   . Insomnia   . 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 Mitral valve prolapse   . Osteopenia   . Scoliosis 2005  . Sleep apnea   . Tick bite 2014   STARI-antibiotics x 5 weeks    PAST SURGICAL HISTORY: Past Surgical History:  Procedure Laterality Date  . ABDOMINAL HYSTERECTOMY     partial  for pain no cancer    . APPENDECTOMY    . BREAST BIOPSY Right    x 2   . CESAREAN SECTION     x4  . CHOLECYSTECTOMY    . colon polyps    . COLONOSCOPY    . DILATION AND CURETTAGE OF UTERUS     x 2  . WISDOM TOOTH EXTRACTION      FAMILY HISTORY: Family History  Problem Relation Age of Onset  . Diabetes Mother   . Pulmonary fibrosis Mother        82 deceased  . Hypertension Mother   . Hypertension Father   . Alzheimer's disease Father   . Kidney failure Father   . Diabetes Maternal Grandmother   . Cancer Maternal Grandfather        ??  . Colon cancer Neg Hx   . Esophageal cancer Neg Hx   . Rectal cancer Neg Hx   . Stomach cancer Neg Hx     SOCIAL HISTORY: Social History   Socioeconomic History  . Marital status: Married    Spouse name: Not on file  . Number of children: Not on file  . Years of education: Not on file  . Highest education  level: Not on file  Occupational History  . Not on file  Social Needs  . Financial resource strain: Not on file  . Food insecurity:    Worry: Not on file    Inability: Not on file  . Transportation needs:    Medical: Not on file    Non-medical: Not on file  Tobacco Use  . Smoking status: Never Smoker  . Smokeless tobacco: Never Used  Substance and Sexual Activity  . Alcohol use: Yes    Comment: 3 or so time a  week-wine or beer  . Drug use: No  . Sexual activity: Yes    Partners: Male    Birth control/protection: Surgical, Post-menopausal    Comment: TAH  Lifestyle  . Physical activity:    Days per week: Not on file    Minutes per session: Not on file  . Stress: Not on file  Relationships  . Social connections:    Talks on phone: Not on file    Gets together: Not on file    Attends religious service: Not on file    Active member of club or organization: Not on file    Attends meetings of clubs or organizations: Not on file    Relationship status: Not on file  . Intimate partner violence:    Fear of current or ex partner: Not on file    Emotionally abused: Not on file    Physically abused: Not on file    Forced sexual activity: Not on file  Other Topics Concern  . 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  Generalized: Well developed, in no acute distress  Head: normocephalic and atraumatic,. Oropharynx benign malopatti2 Neck: Supple, circumference 14 Musculoskeletal: No deformity   Neurological examination   Mentation: Alert oriented to time, place, history taking. Attention  span and concentration appropriate. Recent and remote memory intact.  Follows all commands speech and language fluent.   Cranial nerve II-XII: Pupils were equal round reactive to light extraocular movements were full, visual field were full on confrontational test. Facial sensation and strength were normal. hearing was intact to finger rubbing bilaterally. Uvula tongue midline. head turning and shoulder shrug were normal and symmetric.Tongue protrusion into cheek strength was normal. Motor: normal bulk and tone, full strength in the BUE, BLE,  Sensory: normal and symmetric to light touch,   Coordination: finger-nose-finger, heel-to-shin bilaterally, no dysmetria Gait and Station: Rising up from seated position without assistance, normal stance,  moderate stride, good arm swing, smooth turning, able to perform tiptoe, and heel walking without difficulty. Tandem gait is steady  DIAGNOSTIC DATA (LABS, IMAGING, TESTING) - I reviewed patient records, labs, notes, testing and imaging myself where available.  Lab Results  Component Value Date   WBC 5.3 12/12/2017   HGB 13.9 12/12/2017   HCT 42.0 12/12/2017   MCV 92.6 12/12/2017   PLT 287.0 12/12/2017      Component Value Date/Time   NA 142 12/12/2017 0934   K 4.7 12/12/2017 0934   CL 105 12/12/2017 0934   CO2 30 12/12/2017 0934   GLUCOSE 87 12/12/2017 0934   BUN 16 12/12/2017 0934   CREATININE 0.69 12/12/2017 0934   CREATININE 0.66 07/01/2015 1415   CALCIUM 9.4 12/12/2017 0934   PROT 6.6 12/12/2017 0934   ALBUMIN 4.6 12/12/2017 0934   AST 14 12/12/2017 0934   ALT 14 12/12/2017 0934   ALKPHOS 71 12/12/2017 0934   BILITOT 0.5 12/12/2017 0934   GFRNONAA 89.85 02/24/2010 1118  Lab Results  Component Value Date   CHOL 125 03/09/2018   HDL 51.60 03/09/2018   LDLCALC 61 03/09/2018   LDLDIRECT 138.0 10/13/2011   TRIG 59.0 03/09/2018   CHOLHDL 2 03/09/2018   No results found for: HGBA1C Lab Results  Component Value Date    VITAMINB12 384 10/08/2016   Lab Results  Component Value Date   TSH 1.54 12/12/2017      ASSESSMENT AND PLAN  72 y.o. year old female  has a past medical history mild obstructive sleep apnea here for initial CPAP compliance. Data dated 02/12/2018-03/13/2018 shows greater than 4 hours for 25 days at 83%.  Average usage 5 hours 20 minutes.  Set pressure 5-12 cm EPR 3.  AHI 6.5     PLAN: CPAP compliance good at 83% AHI high at 6.5 will increase max pressure to 13, continue other settings. Follow-up in 2 months Dennie Bible, [facility], [facility], APRN  [facility] [address] [place], [place] [address] [phone]

## 2018-03-15 ENCOUNTER — Ambulatory Visit: Payer: 59 | Admitting: Nurse Practitioner

## 2018-03-15 ENCOUNTER — Encounter: Payer: Self-pay | Admitting: Nurse Practitioner

## 2018-03-15 DIAGNOSIS — Z9989 Dependence on other enabling machines and devices: Secondary | ICD-10-CM | POA: Diagnosis not present

## 2018-03-15 DIAGNOSIS — G4733 Obstructive sleep apnea (adult) (pediatric): Secondary | ICD-10-CM | POA: Diagnosis not present

## 2018-03-15 NOTE — Progress Notes (Signed)
Fax confirmation received to [facility]/dme for cpap pressure change [phone]. sy

## 2018-03-15 NOTE — Patient Instructions (Signed)
CPAP compliance good at 83% AHI high at 6.5 will increase max pressure to 13, continue other settings. Follow-up in 2 months

## 2018-03-16 ENCOUNTER — Telehealth: Payer: Self-pay

## 2018-03-16 NOTE — Telephone Encounter (Signed)
Checked with [employer] to see if they would authorized an in-lab sleep study since patient has had difficulty with cpap mask. It was denied due to her download showed compliance met. She may benefit from a repeat home sleep study to see where her apnea is.

## 2018-03-27 DIAGNOSIS — G4733 Obstructive sleep apnea (adult) (pediatric): Secondary | ICD-10-CM | POA: Diagnosis not present

## 2018-04-26 DIAGNOSIS — G4733 Obstructive sleep apnea (adult) (pediatric): Secondary | ICD-10-CM | POA: Diagnosis not present

## 2018-04-27 DIAGNOSIS — L723 Sebaceous cyst: Secondary | ICD-10-CM | POA: Diagnosis not present

## 2018-04-27 DIAGNOSIS — D2262 Melanocytic nevi of left upper limb, including shoulder: Secondary | ICD-10-CM | POA: Diagnosis not present

## 2018-04-27 DIAGNOSIS — L821 Other seborrheic keratosis: Secondary | ICD-10-CM | POA: Diagnosis not present

## 2018-04-27 DIAGNOSIS — D1801 Hemangioma of skin and subcutaneous tissue: Secondary | ICD-10-CM | POA: Diagnosis not present

## 2018-05-09 ENCOUNTER — Other Ambulatory Visit: Payer: Self-pay | Admitting: Internal Medicine

## 2018-05-09 NOTE — Progress Notes (Signed)
[facility]  PATIENT: Leslie Reed DOB: [birth year]   REASON FOR VISIT: Follow-up for mild obstructive sleep apnea here for  CPAP compliance HISTORY FROM: Patient   HISTORY OF PRESENT ILLNESS:UPDATE 6/12/2019CM Leslie Reed, 72 year old female returns for follow-up with history of obstructive sleep apnea here for CPAP compliance.  At her last visit her AHI was elevated pressure was increased slightly which improved her AHI now at 2.6.  She is having problems with issues with her mask at the back of her head.  She was made aware she needs to go to the equipment company Compliance data dated 04/09/2017-05/08/2018 shows compliance greater than 4 hours at 97%.  Average usage 5 hours 10 minutes.  Pressure set 5 to 13 cm.  EPR level 3 AHI 2.6.  She returns for reevaluation.   UPDATE 4/17/2019CM Leslie Reed, 72 year old female returns for follow-up for initial CPAP compliance.  She claims she hates the CPAP but she continues to use it.  She also states she has been through at least 9 different mask to try to get one that is comfortable for her.  She has had several days where she had a bad cold and did not use the machine.  CPAP data dated 02/12/2018-03/13/2018 shows greater than 4 hours for 25 days at 83%.  Average usage 5 hours 20 minutes.  Set pressure 5-12 cm EPR 3.  AHI 6.5 she returns for reevaluation   11/17/17 [name] is a 72 y.o. female , seen here as in a referral from Dr. Regis Bill. She had seen me 5-6 year ago for memory. Had MRI - negative.  I have also followed Leslie Reed son Leslie Reed, here in the clinic.  Her concern today is not so much directed toward sleep, she is concerned about her progressive memory deficits.  He has mostly insomnia concerns.  She reports weight gain, daytime fatigue chest pain back pain, hearing loss and ringing in her ears, memory loss, headaches insomnia and restless legs the feeling of not getting enough sleep or not enough quality sleep.   She has a history of cholecystectomy 2 breast tumors surgically removed appendectomy, polypectomy during colonoscopy, she had 4 C-sections, and she is status post partial hysterectomy.  Her only medication at this time is vitamin D 50,000 units.   Chief complaint according to patient : Leslie Reed is concerned about memory deficits, attention deficits, and still has poor sleep and insomnia issues. She has a feeling that she cannot access her vocabulary, she also gave anecdotal evidence.  She met with friends all of them had watch the same movie but she could not recall the content of the story line.  She has difficulties remembering names of sports, she has difficulties recalling names of colleagues from 30 years ago that she would otherwise felt easy to recall.  She was usually putting herself on her photographic memory but she feels now that if she does not repeat certain information over and over or writes it down it would not be accessible a week later.  Immediate recall is not her problem - it is the intermediate recall.  Sleep habits are as follows: Bedtime is between 9 and 11 PM for this patient earlier than 5 years ago.  She usually can sleep through the night for about 3-5 hours interrupted by a prolonged period of wakefulness sometimes 1-1/2 hours before she can continue to go back to sleep.  It is not a problem however for her to initiate sleep in  the first place. It depends on her plans for the day as she is still working, she usually wakes up between 7 and 8 AM.  This would allow for 7 hours of sleep at night but not in 1 piece. She reads newspaper in the middle of the night. Often she falls asleep an finds herself covered under the newspaper. She reports no longer having vivid dreams, no longer snoring. Husband snores and she moved to a different bedroom. Some sleep gasping. No daytime naps.     REVIEW OF SYSTEMS: Full 14 system review of systems performed and notable only for those  listed, all others are neg:  Constitutional: neg Cardiovascular: neg Ear/Nose/Throat: neg Skin: neg Eyes: neg Respiratory: neg Gastroitestinal: neg  Hematology/Lymphatic: neg  Endocrine: neg Musculoskeletal:neg Allergy/Immunology: neg Neurological: neg Psychiatric: neg Sleep : Obstructive sleep apnea with CPAP   ALLERGIES: Allergies  Allergen Reactions  . Pramoxine Hcl     REACTION: irritation on rectal area    HOME MEDICATIONS: Outpatient Medications Prior to Visit  Medication Sig Dispense Refill  . pseudoephedrine-acetaminophen (TYLENOL SINUS) 30-500 MG TABS Take 1 tablet by mouth as needed.    . rosuvastatin (CRESTOR) 10 MG tablet TAKE 1 TABLET(10 MG) BY MOUTH DAILY 90 tablet 1  . Vitamin D, Ergocalciferol, (DRISDOL) 50000 units CAPS capsule TAKE ONE CAPSULE BY MOUTH EVERY 2 WEEKS. 2 capsule 11   No facility-administered medications prior to visit.     PAST MEDICAL HISTORY: Past Medical History:  Diagnosis Date  . Allergy   . Arthritis    hands, lower back  . Bronchitis    uses inhaler prn  . Cataract    just being monitored - no surgery  . Colitis 8/04  . Diverticulitis   . GERD (gastroesophageal reflux disease)    diet controlled, no meds  . History of migraine headaches   . Hx of colonic polyp   . Hx of varicella   . Insomnia   . 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 Mitral valve prolapse   . Osteopenia   . Scoliosis 2005  . Sleep apnea   . Tick bite 2014   STARI-antibiotics x 5 weeks    PAST SURGICAL HISTORY: Past Surgical History:  Procedure Laterality Date  . ABDOMINAL HYSTERECTOMY     partial  for pain no cancer    . APPENDECTOMY    . BREAST BIOPSY Right    x 2   . CESAREAN SECTION     x4  . CHOLECYSTECTOMY    . colon polyps    . COLONOSCOPY    . DILATION AND CURETTAGE OF UTERUS     x 2  . WISDOM TOOTH EXTRACTION      FAMILY HISTORY: Family History  Problem Relation Age of Onset  . Diabetes Mother   . Pulmonary fibrosis Mother         60 deceased  . Hypertension Mother   . Hypertension Father   . Alzheimer's disease Father   . Kidney failure Father   . Diabetes Maternal Grandmother   . Cancer Maternal Grandfather        ??  . Colon cancer Neg Hx   . Esophageal cancer Neg Hx   . Rectal cancer Neg Hx   . Stomach cancer Neg Hx     SOCIAL HISTORY: Social History   Socioeconomic History  . Marital status: Married    Spouse name: Not on file  . Number of children: Not on file  . Years of education:  Not on file  . Highest education level: Not on file  Occupational History  . Not on file  Social Needs  . Financial resource strain: Not on file  . Food insecurity:    Worry: Not on file    Inability: Not on file  . Transportation needs:    Medical: Not on file    Non-medical: Not on file  Tobacco Use  . Smoking status: Never Smoker  . Smokeless tobacco: Never Used  Substance and Sexual Activity  . Alcohol use: Yes    Comment: 3 or so time a week-wine or beer  . Drug use: No  . Sexual activity: Yes    Partners: Male    Birth control/protection: Surgical, Post-menopausal    Comment: TAH  Lifestyle  . Physical activity:    Days per week: Not on file    Minutes per session: Not on file  . Stress: Not on file  Relationships  . Social connections:    Talks on phone: Not on file    Gets together: Not on file    Attends religious service: Not on file    Active member of club or organization: Not on file    Attends meetings of clubs or organizations: Not on file    Relationship status: Not on file  . Intimate partner violence:    Fear of current or ex partner: Not on file    Emotionally abused: Not on file    Physically abused: Not on file    Forced sexual activity: Not on file  Other Topics Concern  . 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  Generalized: Well developed, in no acute distress  Head: normocephalic and atraumatic,. Oropharynx benign malopatti 2 Neck: Supple, circumference 14 Lungs clear Musculoskeletal: No deformity  Skin no edema  Neurological examination   Mentation: Alert oriented to time, place, history taking. Attention span and concentration appropriate. Recent and remote memory intact.  Follows all commands speech and language fluent.   Cranial nerve II-XII: Pupils were equal round reactive to light extraocular movements were full, visual field were full on confrontational test. Facial sensation and strength were normal. hearing was intact to finger rubbing bilaterally. Uvula tongue midline. head turning and shoulder shrug were normal and symmetric.Tongue protrusion into cheek strength was normal. Motor: normal bulk and tone, full strength in the BUE, BLE,  Sensory: normal and symmetric to light touch,   Coordination: finger-nose-finger, heel-to-shin bilaterally, no dysmetria Gait and Station: Rising up from seated position without assistance, normal stance,  moderate stride, good arm swing, smooth turning, able to perform tiptoe, and heel walking without difficulty. Tandem gait is steady  DIAGNOSTIC DATA (LABS, IMAGING, TESTING) - I reviewed patient records, labs, notes, testing and imaging myself where available.  Lab Results  Component Value Date   WBC 5.3 12/12/2017   HGB 13.9 12/12/2017   HCT 42.0 12/12/2017   MCV 92.6 12/12/2017   PLT  287.0 12/12/2017      Component Value Date/Time   NA 142 12/12/2017 0934   K 4.7 12/12/2017 0934   CL 105 12/12/2017 0934   CO2 30 12/12/2017 0934   GLUCOSE 87 12/12/2017 0934   BUN 16 12/12/2017 0934   CREATININE 0.69  12/12/2017 0934   CREATININE 0.66 07/01/2015 1415   CALCIUM 9.4 12/12/2017 0934   PROT 6.6 12/12/2017 0934   ALBUMIN 4.6 12/12/2017 0934   AST 14 12/12/2017 0934   ALT 14 12/12/2017 0934   ALKPHOS 71 12/12/2017 0934   BILITOT 0.5 12/12/2017 0934   GFRNONAA 89.85 02/24/2010 1118   Lab Results  Component Value Date   CHOL 125 03/09/2018   HDL 51.60 03/09/2018   LDLCALC 61 03/09/2018   LDLDIRECT 138.0 10/13/2011   TRIG 59.0 03/09/2018   CHOLHDL 2 03/09/2018    Lab Results  Component Value Date   VITAMINB12 384 10/08/2016   Lab Results  Component Value Date   TSH 1.54 12/12/2017      ASSESSMENT AND PLAN  72 y.o. year old female  has a past medical history mild obstructive sleep apnea here for  CPAP compliance.  Data dated 04/09/2017-05/08/2018 shows compliance greater than 4 hours at 97%.  Average usage 5 hours 10 minutes.  Pressure set 5 to 13 cm.  EPR level 3 AHI 2.6.   PLAN: CPAP compliance 97% AHI improved at 2.6 with  increase max pressure to 13,  continue other settings. Follow-up yearly Leslie Reed, [facility], [facility], APRN  [facility] [address] [place], [place] [address] [phone]

## 2018-05-10 ENCOUNTER — Telehealth: Payer: Self-pay | Admitting: Neurology

## 2018-05-10 ENCOUNTER — Ambulatory Visit: Payer: 59 | Admitting: Nurse Practitioner

## 2018-05-10 ENCOUNTER — Encounter: Payer: Self-pay | Admitting: Nurse Practitioner

## 2018-05-10 VITALS — BP 116/72 | HR 75 | Ht 64.0 in | Wt 147.2 lb

## 2018-05-10 DIAGNOSIS — Z9989 Dependence on other enabling machines and devices: Secondary | ICD-10-CM | POA: Diagnosis not present

## 2018-05-10 DIAGNOSIS — G4733 Obstructive sleep apnea (adult) (pediatric): Secondary | ICD-10-CM

## 2018-05-10 NOTE — Patient Instructions (Addendum)
CPAP compliance 97% AHI improved at 2.6 with  increase max pressure to 13,  continue other settings. Follow up with equipment company about mask  Follow-up yearly

## 2018-05-19 NOTE — Progress Notes (Signed)
I agree with the assessment and plan as directed by NP .The patient is known to me .   Ira Dougher, MD  

## 2018-05-27 DIAGNOSIS — G4733 Obstructive sleep apnea (adult) (pediatric): Secondary | ICD-10-CM | POA: Diagnosis not present

## 2018-06-08 ENCOUNTER — Other Ambulatory Visit: Payer: Self-pay | Admitting: Internal Medicine

## 2018-06-08 NOTE — Telephone Encounter (Signed)
Copied from [place] [phone]. Topic: General - Other >> Jun 08, 2018 12:03 PM Leslie Reed R wrote: Pt wants to know if more than a 30 day supply of rosuvastatin (CRESTOR) 10 MG tablet can be sent to the pharmacy and why only a 30 day supply was sent  Callback # [id number]

## 2018-06-12 ENCOUNTER — Ambulatory Visit: Payer: 59 | Admitting: Nurse Practitioner

## 2018-06-26 DIAGNOSIS — G4733 Obstructive sleep apnea (adult) (pediatric): Secondary | ICD-10-CM | POA: Diagnosis not present

## 2018-07-27 DIAGNOSIS — G4733 Obstructive sleep apnea (adult) (pediatric): Secondary | ICD-10-CM | POA: Diagnosis not present

## 2018-08-08 DIAGNOSIS — Z1231 Encounter for screening mammogram for malignant neoplasm of breast: Secondary | ICD-10-CM | POA: Diagnosis not present

## 2018-08-16 DIAGNOSIS — Z23 Encounter for immunization: Secondary | ICD-10-CM | POA: Diagnosis not present

## 2018-08-27 DIAGNOSIS — G4733 Obstructive sleep apnea (adult) (pediatric): Secondary | ICD-10-CM | POA: Diagnosis not present

## 2018-08-28 ENCOUNTER — Encounter: Payer: Self-pay | Admitting: Obstetrics & Gynecology

## 2018-09-21 DIAGNOSIS — H10413 Chronic giant papillary conjunctivitis, bilateral: Secondary | ICD-10-CM | POA: Diagnosis not present

## 2018-09-21 DIAGNOSIS — H04123 Dry eye syndrome of bilateral lacrimal glands: Secondary | ICD-10-CM | POA: Diagnosis not present

## 2018-09-21 DIAGNOSIS — H2513 Age-related nuclear cataract, bilateral: Secondary | ICD-10-CM | POA: Diagnosis not present

## 2018-09-26 DIAGNOSIS — G4733 Obstructive sleep apnea (adult) (pediatric): Secondary | ICD-10-CM | POA: Diagnosis not present

## 2018-10-30 ENCOUNTER — Ambulatory Visit: Payer: 59 | Admitting: Family Medicine

## 2018-10-30 ENCOUNTER — Encounter: Payer: Self-pay | Admitting: Family Medicine

## 2018-10-30 VITALS — BP 134/84 | HR 84 | Temp 97.7°F | Wt 149.4 lb

## 2018-10-30 DIAGNOSIS — J019 Acute sinusitis, unspecified: Secondary | ICD-10-CM

## 2018-10-30 MED ORDER — AMOXICILLIN-POT CLAVULANATE 875-125 MG PO TABS: 1.0000 | ORAL_TABLET | Freq: Two times a day (BID) | ORAL | 0 refills | Status: DC

## 2018-10-30 NOTE — Progress Notes (Signed)
   Subjective:    Patient ID: Leslie Reed, female    DOB: 10/21/1946, 72 y.o.   MRN: [id number]  HPI Here for 12 days of sinus pressure, PND, and coughing up green sputum. No fever. Using Mucinex.    Review of Systems  Constitutional: Negative.   HENT: Positive for congestion, postnasal drip and sinus pressure. Negative for sinus pain and sore throat.   Eyes: Negative.   Respiratory: Positive for cough.        Objective:   Physical Exam  Constitutional: She appears well-developed and well-nourished.  HENT:  Right Ear: External ear normal.  Left Ear: External ear normal.  Nose: Nose normal.  Mouth/Throat: Oropharynx is clear and moist.  Eyes: Conjunctivae are normal.  Neck: No thyromegaly present.  Pulmonary/Chest: Effort normal and breath sounds normal. No stridor. No respiratory distress. She has no wheezes. She has no rales.  Lymphadenopathy:    She has no cervical adenopathy.          Assessment & Plan:  Sinusitis, treat with Augmentin.  Alysia Penna, MD

## 2018-11-11 ENCOUNTER — Other Ambulatory Visit: Payer: Self-pay | Admitting: Obstetrics & Gynecology

## 2018-11-13 NOTE — Telephone Encounter (Signed)
Medication refill request: Vitamin D.  Last AEX: 01/24/18 Next AEX: 01/30/19 Last MMG (if hormonal medication request): 08/08/18 Density B / Birads 1 Neg  Refill authorized: #2 with 2 RF

## 2018-12-05 ENCOUNTER — Encounter: Payer: Self-pay | Admitting: Adult Health

## 2018-12-07 ENCOUNTER — Encounter: Payer: Self-pay | Admitting: Neurology

## 2018-12-07 ENCOUNTER — Ambulatory Visit: Payer: 59 | Admitting: Neurology

## 2018-12-07 VITALS — BP 107/68 | HR 79 | Ht 64.5 in | Wt 151.0 lb

## 2018-12-07 DIAGNOSIS — G3184 Mild cognitive impairment, so stated: Secondary | ICD-10-CM

## 2018-12-07 DIAGNOSIS — G4733 Obstructive sleep apnea (adult) (pediatric): Secondary | ICD-10-CM

## 2018-12-07 DIAGNOSIS — R6889 Other general symptoms and signs: Secondary | ICD-10-CM | POA: Diagnosis not present

## 2018-12-07 DIAGNOSIS — Z9989 Dependence on other enabling machines and devices: Secondary | ICD-10-CM

## 2018-12-07 DIAGNOSIS — M2550 Pain in unspecified joint: Secondary | ICD-10-CM

## 2018-12-07 DIAGNOSIS — M791 Myalgia, unspecified site: Secondary | ICD-10-CM | POA: Diagnosis not present

## 2018-12-07 DIAGNOSIS — G4701 Insomnia due to medical condition: Secondary | ICD-10-CM

## 2018-12-07 NOTE — Progress Notes (Signed)
[facility]  PATIENT: Leslie Reed DOB: Sep 26, 1946   REASON FOR VISIT: Follow-up for mild obstructive sleep apnea here for CPAP compliance. HISTORY FROM: Patient alone-  " I am still an insomniac"   Interval history from 07 December 2018 I had the pleasure of seeing Leslie Reed today who had since I have seen her last undergone a home sleep test by watch Fraser Din on 07 December 2017 exactly a year ago home sleep test was based on an elevated fatigue severity score at 38 points and resulted in an AHI of 7.4, RDI was 8.6, there was no prolonged oxygenation time, heart rate was 59 bpm with a tendency to be bradycardic.  This was followed by a an in lab test was ordered for his REM behavior disorder and was refused by her insurance.  She started on an auto titration CPAP between 5 and 13 cmH2O following the home sleep test results there is 3 cm expiratory pressure relief set for her as well she has achieved and 97% compliance and has been compliant in the last 2 visits with Leslie Rubin, Leslie Reed.  Average use of time is 5 hours 3 minutes result residual AHI is 2.3 which is excellent the residual apneas consist mostly of obstructive apneas telling me that she could handle a little bit higher pressure well.  Epworth sleepiness score was reduced to 8, fatigue severity was still high at 42 and the geriatric depression score today was 4 out of 15 points.  We also addressed her mild cognitive impairment history which I think is partially due to overwhelming concern in regards to her son's failing health.  I did Montreal Cognitive Assessment  12/07/2018 11/17/2017  Visuospatial/ Executive (0/5) 5 5  Naming (0/3) 3 3  Attention: Read list of digits (0/2) 2 2  Attention: Read list of letters (0/1) 1 1  Attention: Serial 7 subtraction starting at 100 (0/3) 3 3  Language: Repeat phrase (0/2) 2 2  Language : Fluency (0/1) 1 1  Abstraction (0/2) 2 2  Delayed Recall (0/5) 3 2  Orientation (0/6) 6 6    Total 28 27      UPDATE 05/10/2018 CM [name], 73 year old female returns for follow-up with history of obstructive sleep apnea here for CPAP compliance.  At her last visit her AHI was elevated pressure was increased slightly which improved her AHI now at 2.6.  She is having problems with issues with her mask at the back of her head.  She was made aware she needs to go to the equipment company Compliance data dated 04/09/2017-05/08/2018 shows compliance greater than 4 hours at 97%.  Average usage 5 hours 10 minutes.  Pressure set 5 to 13 cm.  EPR level 3 AHI 2.6.  She returns for reevaluation.   UPDATE 03/15/2018 CM Leslie Reed, 73 year old female returns for follow-up for initial CPAP compliance.  She claims she hates the CPAP but she continues to use it.  She also states she has been through at least 9 different mask to try to get one that is comfortable for her.  She has had several days where she had a bad cold and did not use the machine.  CPAP data dated 02/12/2018-03/13/2018 shows greater than 4 hours for 25 days at 83%.  Average usage 5 hours 20 minutes.  Set pressure 5-12 cm EPR 3.  AHI 6.5 she returns for reevaluation  HISTORY OF PRESENT ILLNESS: 11/17/17 Leslie Reed is a 73 y.o. female , seen  here as in a referral from Dr. Regis Bill. She had seen me 5-6 year ago for memory. Had MRI - negative.  I have also followed Leslie Reed son Leslie Reed, here in the clinic.  Her concern today is not so much directed toward sleep, she is concerned about her progressive memory deficits.  He has mostly insomnia concerns.  She reports weight gain, daytime fatigue chest pain back pain, hearing loss and ringing in her ears, memory loss, headaches insomnia and restless legs the feeling of not getting enough sleep or not enough quality sleep.  She has a history of cholecystectomy 2 breast tumors surgically removed appendectomy, polypectomy during colonoscopy, she had 4 C-sections, and she is status post partial  hysterectomy.  Her only medication at this time is vitamin D 50,000 units.   Chief complaint according to patient : Leslie Reed is concerned about memory deficits, attention deficits, and still has poor sleep and insomnia issues. She has a feeling that she cannot access her vocabulary, she also gave anecdotal evidence.  She met with friends all of them had watch the same movie but she could not recall the content of the story line.  She has difficulties remembering names of sports, she has difficulties recalling names of colleagues from 30 years ago that she would otherwise felt easy to recall.  She was usually putting herself on her photographic memory but she feels now that if she does not repeat certain information over and over or writes it down it would not be accessible a week later.  Immediate recall is not her problem - it is the intermediate recall.  Sleep habits are as follows: Bedtime is between 9 and 11 PM for this patient earlier than 5 years ago.  She usually can sleep through the night for about 3-5 hours interrupted by a prolonged period of wakefulness sometimes 1-1/2 hours before she can continue to go back to sleep.  It is not a problem however for her to initiate sleep in the first place. It depends on her plans for the day as she is still working, she usually wakes up between 7 and 8 AM.  This would allow for 7 hours of sleep at night but not in 1 piece. She reads newspaper in the middle of the night. Often she falls asleep an finds herself covered under the newspaper.She reports no longer having vivid dreams, no longer snoring. Husband snores and she moved to a different bedroom. Some sleep gasping. No daytime naps.     REVIEW OF SYSTEMS: Full 14 system review of systems performed and notable only for those listed, all others are neg:  Obstructive sleep apnea on ( compliant ) CPAP therapy .   Epworth Sleepiness score: 8/ 24 points  FSS:  42/ 63 points  Depression score: 4/ 15  points     ALLERGIES: Allergies  Allergen Reactions  . Pramoxine Hcl     REACTION: irritation on rectal area    HOME MEDICATIONS: Outpatient Medications Prior to Visit  Medication Sig Dispense Refill  . pseudoephedrine-acetaminophen (TYLENOL SINUS) 30-500 MG TABS Take 1 tablet by mouth as needed.    . rosuvastatin (CRESTOR) 10 MG tablet TAKE 1 TABLET(10 MG) BY MOUTH DAILY 90 tablet 1  . Vitamin D, Ergocalciferol, (DRISDOL) 1.25 MG (50000 UT) CAPS capsule TAKE ONE CAPSULE BY MOUTH EVERY 2 WEEKS 2 capsule 2  . amoxicillin-clavulanate (AUGMENTIN) 875-125 MG tablet Take 1 tablet by mouth 2 (two) times daily. 20 tablet 0   No facility-administered  medications prior to visit.     PAST MEDICAL HISTORY: Past Medical History:  Diagnosis Date  . Allergy   . Arthritis    hands, lower back  . Bronchitis    uses inhaler prn  . Cataract    just being monitored - no surgery  . Colitis 8/04  . Diverticulitis   . GERD (gastroesophageal reflux disease)    diet controlled, no meds  . History of migraine headaches   . Hx of colonic polyp   . Hx of varicella   . Insomnia   . 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 Mitral valve prolapse   . Osteopenia   . Scoliosis 2005  . Sleep apnea   . Tick bite 2014   STARI-antibiotics x 5 weeks    PAST SURGICAL HISTORY: Past Surgical History:  Procedure Laterality Date  . ABDOMINAL HYSTERECTOMY     partial  for pain no cancer    . APPENDECTOMY    . BREAST BIOPSY Right    x 2   . CESAREAN SECTION     x4  . CHOLECYSTECTOMY    . colon polyps    . COLONOSCOPY    . DILATION AND CURETTAGE OF UTERUS     x 2  . WISDOM TOOTH EXTRACTION      FAMILY HISTORY: Family History  Problem Relation Age of Onset  . Diabetes Mother   . Pulmonary fibrosis Mother        68 deceased  . Hypertension Mother   . Hypertension Father   . Alzheimer's disease Father   . Kidney failure Father   . Diabetes Maternal Grandmother   . Cancer Maternal Grandfather         ??  . Colon cancer Neg Hx   . Esophageal cancer Neg Hx   . Rectal cancer Neg Hx   . Stomach cancer Neg Hx     SOCIAL HISTORY: Social History   Socioeconomic History  . Marital status: Married    Spouse name: Not on file  . Number of children: Not on file  . Years of education: Not on file  . Highest education level: Not on file  Occupational History  . Not on file  Social Needs  . Financial resource strain: Not on file  . Food insecurity:    Worry: Not on file    Inability: Not on file  . Transportation needs:    Medical: Not on file    Non-medical: Not on file  Tobacco Use  . Smoking status: Never Smoker  . Smokeless tobacco: Never Used  Substance and Sexual Activity  . Alcohol use: Yes    Comment: 3 or so time a week-wine or beer  . Drug use: No  . Sexual activity: Yes    Partners: Male    Birth control/protection: Surgical, Post-menopausal    Comment: TAH  Lifestyle  . Physical activity:    Days per week: Not on file    Minutes per session: Not on file  . Stress: Not on file  Relationships  . Social connections:    Talks on phone: Not on file    Gets together: Not on file    Attends religious service: Not on file    Active member of club or organization: Not on file    Attends meetings of clubs or organizations: Not on file    Relationship status: Not on file  . Intimate partner violence:    Fear of current or ex partner: Not on  file    Emotionally abused: Not on file    Physically abused: Not on file    Forced sexual activity: Not on file  Other Topics Concern  . 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  Generalized: Well developed, in no acute distress  Head: normocephalic and atraumatic,. Oropharynx benign malopatti 2 Neck: Supple, neck circumference 14.5"  Lungs clear to auscultation.  Dry cough, non productive.  Musculoskeletal: No deformity- normal posture.   Skin no edema  Neurological examination:  Mentation: Alert oriented to time, place, history taking. Attention span and concentration appropriate. Recent and remote memory intact. Follows all commands speech and language fluent.   Cranial nerve:  Smell and taste intact. Pupils were equally sized and  Round, are  reactive to light and accomodation. extraocular movements were full, visual field were full on confrontational test.  Facial sensation and strength were normal.  Hearing bilaterally impaired , mild- low frequency loss- . Uvula and tongue move  midline. ROM normal for head turning and shoulder shrug - symmetric.Tongue protrusion into cheek strength was normal. Motor: normal bulk and tone, full strength. Grip is strong/ she has myalgia in quadriceps and shoulder, joint pain, too. Stiffness. Achiness.  Sensory: symmetric to light touch,  reduced vibration sense in both ankles .  Coordination: finger-nose-finger- without ataxia, dysmetria, or  tremor.  Gait and Station: Rising up from seated position without assistance, but clearly feels impaired - braces herself. Reports she can't get up form a kneeling or squatting position ,   Normal stance,  moderate stride, good arm swing, smooth turning.  DIAGNOSTIC DATA (LABS, IMAGING, TESTING) - I reviewed patient records, labs, notes, testing and imaging myself where available.  Hypercholesterolemia.   Lab Results  Component Value Date   WBC 5.3 12/12/2017   HGB 13.9 12/12/2017   HCT 42.0 12/12/2017   MCV 92.6 12/12/2017   PLT 287.0 12/12/2017      Component Value Date/Time   NA 142 12/12/2017 0934   K 4.7 12/12/2017 0934   CL 105  12/12/2017 0934   CO2 30 12/12/2017 0934   GLUCOSE 87 12/12/2017 0934   BUN 16 12/12/2017 0934   CREATININE 0.69 12/12/2017 0934   CREATININE 0.66 07/01/2015 1415   CALCIUM 9.4 12/12/2017 0934   PROT 6.6 12/12/2017 0934   ALBUMIN 4.6 12/12/2017 0934   AST 14 12/12/2017 0934   ALT 14 12/12/2017 0934   ALKPHOS 71 12/12/2017 0934   BILITOT 0.5 12/12/2017 0934   GFRNONAA 89.85 02/24/2010 1118   Lab Results  Component Value Date   CHOL 125 03/09/2018   HDL 51.60 03/09/2018   LDLCALC 61 03/09/2018   LDLDIRECT 138.0 10/13/2011   TRIG 59.0 03/09/2018   CHOLHDL 2 03/09/2018    Lab Results  Component Value Date   VITAMINB12 384 10/08/2016   Lab Results  Component Value Date   TSH 1.54 12/12/2017      ASSESSMENT AND PLAN  73 y.o.  year old female  has a past medical history mild obstructive sleep apnea here for  CPAP compliance.  Data dated 04/09/2017-05/08/2018 shows compliance greater than 4 hours at 97%.   Average usage 5 hours 13 minutes.  Pressure window set from  5 through 13 cm.   EPR level 3 cm water,  and AHI 2.3.   Will increase CPAP window to 15 cm water.   CPAP compliance 97%. Needs to see rheumatology, asked for  Dr. Amil Amen and Trudie Reed.  Follow-up yearly alternating between Leslie Reed and me,   Larey Seat, MD  [facility] [address], [place], [place] [address] [phone]

## 2018-12-10 ENCOUNTER — Other Ambulatory Visit: Payer: Self-pay | Admitting: Internal Medicine

## 2018-12-14 ENCOUNTER — Encounter: Payer: 59 | Admitting: Internal Medicine

## 2018-12-14 NOTE — Progress Notes (Signed)
Chief Complaint  Patient presents with  . Annual Exam    Pt states that her cough is not going away. pt states the med she is on and is having joint pain     HPI: Leslie Reed 73 y.o. comes in today for Preventive Medicare exam/ wellness visit .Since last visit.  minor irritative cough  Joint and muscle pain    Stiffness .   Has been more problematic wonders if it could be from the Crestor.  Has a follow-up appointment for evaluation with rheumatology. Under care from neurology sleep on CPAP but does not seem to help her.  She is continuing to try.  Health Maintenance  Topic Date Due  . MAMMOGRAM  08/08/2020  . TETANUS/TDAP  04/19/2023  . COLONOSCOPY  09/09/2026  . INFLUENZA VACCINE  Completed  . DEXA SCAN  Completed  . Hepatitis C Screening  Completed  . PNA vac Low Risk Adult  Completed   Health Maintenance Review LIFESTYLE:  Exercise:   landsacping   Tobacco/ETS:  no Alcohol: ave  About 1 per week  Sugar beverages: sodas diet  Sleep: no more than 5   Sleep apnea machine.  For a year and doesn't like it.  Drug use: no HH: 3  Son six.    Immune system  Ich mny issus.   Pet adopted stray cat.     Hearing:  Ok  X low tones   Vision:  No limitations at present . Last eye check UTD  Safety:  Has smoke detector and wears seat belts.  No firearms. No excess sun exposure. Sees dentist regularly.   Memory: concern  Poss mci    Depression: No anhedonia unusual crying or depressive symptoms feels like she is coping.  Nutrition: Eats well balanced diet; adequate calcium and vitamin D. No swallowing chewing problems.  Injury: no major injuries in the last six months.  Other healthcare providers:  Reviewed today .Marland Kitchen   Preventive parameters: up-to-date  Reviewed   ADLS:   There are no problems or need for assistance  driving, feeding, obtaining food, dressing, toileting and bathing, managing money using phone. She is independent.   ROS:  GEN/ HEENT: No fever,  significant weight changes sweats headaches vision problems hearing changes, CV/ PULM; No chest pain shortness of breath  syncope,edema  change in exercise tolerance. GI /GU: No adominal pain, vomiting, change in bowel habits. No blood in the stool. No significant GU symptoms.  Had abnormal urine odor but no dysuria UTI symptoms. SKIN/HEME: ,no acute skin rashes suspicious lesions or bleeding. No lymphadenopathy, nodules, masses.  NEURO/ PSYCH:  No neurologic signs such as weakness numbness. No depression anxiety. IMM/ Allergy: No unusual infections.  Allergy .   REST of 12 system review negative except as per HPI   Past Medical History:  Diagnosis Date  . Allergy   . Arthritis    hands, lower back  . Bronchitis    uses inhaler prn  . Cataract    just being monitored - no surgery  . Colitis 8/04  . Diverticulitis   . GERD (gastroesophageal reflux disease)    diet controlled, no meds  . History of migraine headaches   . Hx of colonic polyp   . Hx of varicella   . Insomnia   . 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 Mitral valve prolapse   . Osteopenia   . Scoliosis 2005  . Sleep apnea   . Tick bite 2014   STARI-antibiotics x  5 weeks    Family History  Problem Relation Age of Onset  . Diabetes Mother   . Pulmonary fibrosis Mother        63 deceased  . Hypertension Mother   . Hypertension Father   . Alzheimer's disease Father   . Kidney failure Father   . Diabetes Maternal Grandmother   . Cancer Maternal Grandfather        ??  . Colon cancer Neg Hx   . Esophageal cancer Neg Hx   . Rectal cancer Neg Hx   . Stomach cancer Neg Hx     Social History   Socioeconomic History  . Marital status: Married    Spouse name: Not on file  . Number of children: Not on file  . Years of education: Not on file  . Highest education level: Not on file  Occupational History  . Not on file  Social Needs  . Financial resource strain: Not on file  . Food insecurity:    Worry: Not on file     Inability: Not on file  . Transportation needs:    Medical: Not on file    Non-medical: Not on file  Tobacco Use  . Smoking status: Never Smoker  . Smokeless tobacco: Never Used  Substance and Sexual Activity  . Alcohol use: Yes    Comment: 3 or so time a week-wine or beer  . Drug use: No  . Sexual activity: Yes    Partners: Male    Birth control/protection: Surgical, Post-menopausal    Comment: TAH  Lifestyle  . Physical activity:    Days per week: Not on file    Minutes per session: Not on file  . Stress: Not on file  Relationships  . Social connections:    Talks on phone: Not on file    Gets together: Not on file    Attends religious service: Not on file    Active member of club or organization: Not on file    Attends meetings of clubs or organizations: Not on file    Relationship status: Not on file  Other Topics Concern  . 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 pseudoephedrine-acetaminophen (TYLENOL SINUS) 30-500 MG TABS Take 1 tablet by mouth as needed.  . rosuvastatin (CRESTOR) 10 MG tablet TAKE 1 TABLET(10 MG) BY MOUTH DAILY  . Vitamin D, Ergocalciferol, (DRISDOL) 1.25 MG (50000 UT) CAPS capsule TAKE ONE CAPSULE BY MOUTH EVERY 2 WEEKS   No facility-administered encounter medications on file as of 12/15/2018.     EXAM:  BP 130/68 (BP Location: Right Arm, Patient Position: Sitting, Cuff Size: Normal)   Pulse 72   Temp 98.1 F (36.7 C) (Oral)   Ht 5' 3.75" (1.619 m)   Wt 147 lb 6.4 oz (66.9 kg)   LMP 11/29/1986   BMI 25.50 kg/m   Body mass index is 25.5 kg/m.  Physical Exam: Vital signs reviewed IOE:VOJJ is a well-developed well-nourished alert cooperative    who appears stated age in no acute distress.  HEENT: normocephalic atraumatic , Eyes: PERRL EOM's full, conjunctiva clear, Nares:  paten,t no deformity discharge or tenderness., Ears: no deformity EAC's clear TMs with normal landmarks. Mouth: clear OP, no lesions, edema.  Moist mucous membranes. Dentition in adequate repair. NECK: supple without masses, thyromegaly or bruits. CHEST/PULM:  Clear to auscultation and percussion breath sounds equal no wheeze , rales or rhonchi. No chest wall deformities or tenderness. CV: PMI is nondisplaced, S1 S2 no gallops, murmurs, rubs. Peripheral pulses are full without delay.No JVD .  ABDOMEN: Bowel sounds normal nontender  No guard or rebound, no hepato splenomegal no CVA tenderness.   Extremtities:  No clubbing cyanosis or edema, no acute joint swelling or redness no focal atrophy some DJD changes ? NEURO:  Oriented x3, cranial nerves 3-12 appear to be intact, no obvious focal weakness,gait within normal limits no abnormal reflexes or asymmetrical SKIN: No acute rashes normal turgor, color, no bruising or petechiae. PSYCH: Oriented, good eye contact, no obvious depression anxiety, cognition and judgment appear normal. LN: no cervical axillary inguinal adenopathy No noted deficits in memory, attention, and speech.   Lab Results  Component Value Date   WBC 5.0 12/15/2018   HGB 13.9 12/15/2018   HCT 41.2 12/15/2018   PLT 294.0 12/15/2018   GLUCOSE 87 12/15/2018   CHOL 127 12/15/2018   TRIG 67.0 12/15/2018   HDL 52.40 12/15/2018   LDLDIRECT 138.0 10/13/2011   LDLCALC 61 12/15/2018   ALT 15 12/15/2018   AST 15 12/15/2018   NA 142 12/15/2018   K 4.6 12/15/2018   CL 106 12/15/2018   CREATININE 0.75 12/15/2018   BUN 15 12/15/2018   CO2 28 12/15/2018   TSH 0.94 12/15/2018    ASSESSMENT AND PLAN:  Discussed the following assessment and plan:  Visit for preventive health examination  Medication management - Plan: POCT Urinalysis Dipstick  (Automated), Basic metabolic panel, CBC with Differential/Platelet, Hepatic function panel, Lipid panel, TSH, C-reactive protein, Cyclic citrul peptide antibody, IgG, CK  Hyperlipidemia, unspecified hyperlipidemia type - Plan: POCT Urinalysis Dipstick (Automated), Basic metabolic panel, CBC with Differential/Platelet, Hepatic function panel, Lipid panel, TSH, C-reactive protein, Cyclic citrul peptide antibody, IgG  Arthralgia, unspecified joint - Plan: POCT Urinalysis Dipstick (Automated), Basic metabolic panel, CBC with Differential/Platelet, Hepatic function panel, Lipid panel, TSH, C-reactive protein, Cyclic citrul peptide antibody, IgG, CK  Cough, persistent - Plan: POCT Urinalysis Dipstick (Automated), Basic metabolic panel, CBC with Differential/Platelet, Hepatic function panel, Lipid panel, TSH, C-reactive protein, Cyclic citrul peptide antibody, IgG  Abnormal urine odor - Plan: POCT Urinalysis Dipstick (Automated), Basic metabolic panel, CBC with Differential/Platelet, Hepatic function panel, Lipid panel, TSH, C-reactive protein, Cyclic citrul peptide antibody, IgG Exam is reassuring today we will do a trial off of the statin medicine and get back on lower dose.  Discussed other situation which could affect which she feels is a memory problem  mci t.  Check urine today thyroid inflammatory markers etc. Patient Care Team: Cherith Tewell, Standley Brooking, MD as PCP - General (Internal Medicine) Stefanie Libel, MD (Family Medicine) Megan Salon, MD (Gynecology) Dohmeier, Asencion Partridge, MD (Neurology)  Patient Instructions  Try off the Crestor for 3-4 weeks and if better add back  3 days per week and if ok then increase back to dialy as tolerated   Will notify you  of labs when available.  Agree with seeing rheumatology   One task at a time  Will help your memory    Preventive Care 65 Years and Older, Female Preventive care refers to lifestyle choices and visits with your health care provider that  can promote  health and wellness. What does preventive care include?  A yearly physical exam. This is also called an annual well check.  Dental exams once or twice a year.  Routine eye exams. Ask your health care provider how often you should have your eyes checked.  Personal lifestyle choices, including: ? Daily care of your teeth and gums. ? Regular physical activity. ? Eating a healthy diet. ? Avoiding tobacco and drug use. ? Limiting alcohol use. ? Practicing safe sex. ? Taking low-dose aspirin every day. ? Taking vitamin and mineral supplements as recommended by your health care provider. What happens during an annual well check? The services and screenings done by your health care provider during your annual well check will depend on your age, overall health, lifestyle risk factors, and family history of disease. Counseling Your health care provider may ask you questions about your:  Alcohol use.  Tobacco use.  Drug use.  Emotional well-being.  Home and relationship well-being.  Sexual activity.  Eating habits.  History of falls.  Memory and ability to understand (cognition).  Work and work Statistician.  Reproductive health.  Screening You may have the following tests or measurements:  Height, weight, and BMI.  Blood pressure.  Lipid and cholesterol levels. These may be checked every 5 years, or more frequently if you are over 54 years old.  Skin check.  Lung cancer screening. You may have this screening every year starting at age 51 if you have a 30-pack-year history of smoking and currently smoke or have quit within the past 15 years.  Colorectal cancer screening. All adults should have this screening starting at age 40 and continuing until age 31. You will have tests every 1-10 years, depending on your results and the type of screening test. People at increased risk should start screening at an earlier age. Screening tests may include: ? Guaiac-based fecal  occult blood testing. ? Fecal immunochemical test (FIT). ? Stool DNA test. ? Virtual colonoscopy. ? Sigmoidoscopy. During this test, a flexible tube with a tiny camera (sigmoidoscope) is used to examine your rectum and lower colon. The sigmoidoscope is inserted through your anus into your rectum and lower colon. ? Colonoscopy. During this test, a long, thin, flexible tube with a tiny camera (colonoscope) is used to examine your entire colon and rectum.  Hepatitis C blood test.  Hepatitis B blood test.  Sexually transmitted disease (STD) testing.  Diabetes screening. This is done by checking your blood sugar (glucose) after you have not eaten for a while (fasting). You may have this done every 1-3 years.  Bone density scan. This is done to screen for osteoporosis. You may have this done starting at age 64.  Mammogram. This may be done every 1-2 years. Talk to your health care provider about how often you should have regular mammograms. Talk with your health care provider about your test results, treatment options, and if necessary, the need for more tests. Vaccines Your health care provider may recommend certain vaccines, such as:  Influenza vaccine. This is recommended every year.  Tetanus, diphtheria, and acellular pertussis (Tdap, Td) vaccine. You may need a Td booster every 10 years.  Varicella vaccine. You may need this if you have not been vaccinated.  Zoster vaccine. You may need this after age 56.  Measles, mumps, and rubella (MMR) vaccine. You may need at least one dose of MMR if you were born in 1957 or later. You may also need a second dose.  Pneumococcal 13-valent conjugate (PCV13) vaccine. One dose is recommended after age 82.  Pneumococcal polysaccharide (PPSV23) vaccine. One dose is recommended after age 67.  Meningococcal vaccine. You may need this if you have certain conditions.  Hepatitis A vaccine. You may need this if you have certain conditions or if you  travel or work in places where you may be exposed to hepatitis A.  Hepatitis B vaccine. You may need this if you have certain conditions or if you travel or work in places where you may be exposed to hepatitis B.  Haemophilus influenzae type b (Hib) vaccine. You may need this if you have certain conditions. Talk to your health care provider about which screenings and vaccines you need and how often you need them. This information is not intended to replace advice given to you by your health care provider. Make sure you discuss any questions you have with your health care provider. Document Released: 12/12/2015 Document Revised: 01/05/2018 Document Reviewed: 09/16/2015 Elsevier Interactive Patient Education  2019 Mikes K. Zanasia Hickson M.D.

## 2018-12-15 ENCOUNTER — Encounter: Payer: Self-pay | Admitting: Internal Medicine

## 2018-12-15 ENCOUNTER — Ambulatory Visit (INDEPENDENT_AMBULATORY_CARE_PROVIDER_SITE_OTHER): Payer: 59 | Admitting: Internal Medicine

## 2018-12-15 VITALS — BP 130/68 | HR 72 | Temp 98.1°F | Ht 63.75 in | Wt 147.4 lb

## 2018-12-15 DIAGNOSIS — R8290 Unspecified abnormal findings in urine: Secondary | ICD-10-CM

## 2018-12-15 DIAGNOSIS — E785 Hyperlipidemia, unspecified: Secondary | ICD-10-CM

## 2018-12-15 DIAGNOSIS — M2550 Pain in unspecified joint: Secondary | ICD-10-CM | POA: Diagnosis not present

## 2018-12-15 DIAGNOSIS — R05 Cough: Secondary | ICD-10-CM

## 2018-12-15 DIAGNOSIS — Z0000 Encounter for general adult medical examination without abnormal findings: Secondary | ICD-10-CM

## 2018-12-15 DIAGNOSIS — Z79899 Other long term (current) drug therapy: Secondary | ICD-10-CM

## 2018-12-15 DIAGNOSIS — R053 Chronic cough: Secondary | ICD-10-CM

## 2018-12-15 LAB — POC URINALSYSI DIPSTICK (AUTOMATED)
Bilirubin, UA: NEGATIVE
Blood, UA: NEGATIVE
Glucose, UA: NEGATIVE
Ketones, UA: NEGATIVE
Leukocytes, UA: NEGATIVE
Nitrite, UA: NEGATIVE
Protein, UA: NEGATIVE
Spec Grav, UA: 1.02 (ref 1.010–1.025)
Urobilinogen, UA: 0.2 E.U./dL
pH, UA: 6.5 (ref 5.0–8.0)

## 2018-12-15 LAB — CBC WITH DIFFERENTIAL/PLATELET
BASOS PCT: 0.7 % (ref 0.0–3.0)
Basophils Absolute: 0 10*3/uL (ref 0.0–0.1)
Eosinophils Absolute: 0.1 10*3/uL (ref 0.0–0.7)
Eosinophils Relative: 1.7 % (ref 0.0–5.0)
HCT: 41.2 % (ref 36.0–46.0)
Hemoglobin: 13.9 g/dL (ref 12.0–15.0)
Lymphocytes Relative: 26.2 % (ref 12.0–46.0)
Lymphs Abs: 1.3 10*3/uL (ref 0.7–4.0)
MCHC: 33.9 g/dL (ref 30.0–36.0)
MCV: 90.7 fl (ref 78.0–100.0)
Monocytes Absolute: 0.5 10*3/uL (ref 0.1–1.0)
Monocytes Relative: 10.7 % (ref 3.0–12.0)
Neutro Abs: 3 10*3/uL (ref 1.4–7.7)
Neutrophils Relative %: 60.7 % (ref 43.0–77.0)
Platelets: 294 10*3/uL (ref 150.0–400.0)
RBC: 4.54 Mil/uL (ref 3.87–5.11)
RDW: 13.7 % (ref 11.5–15.5)
WBC: 5 10*3/uL (ref 4.0–10.5)

## 2018-12-15 LAB — BASIC METABOLIC PANEL
BUN: 15 mg/dL (ref 6–23)
CHLORIDE: 106 meq/L (ref 96–112)
CO2: 28 mEq/L (ref 19–32)
CREATININE: 0.75 mg/dL (ref 0.40–1.20)
Calcium: 9.8 mg/dL (ref 8.4–10.5)
GFR: 76.02 mL/min (ref >60.00)
Glucose, Bld: 87 mg/dL (ref 70–99)
Potassium: 4.6 mEq/L (ref 3.5–5.1)
Sodium: 142 mEq/L (ref 135–145)

## 2018-12-15 LAB — LIPID PANEL
CHOL/HDL RATIO: 2
Cholesterol: 127 mg/dL (ref 0–200)
HDL: 52.4 mg/dL (ref >39.00)
LDL Cholesterol: 61 mg/dL (ref 0–99)
NonHDL: 74.14
Triglycerides: 67 mg/dL (ref 0.0–149.0)
VLDL: 13.4 mg/dL (ref 0.0–40.0)

## 2018-12-15 LAB — C-REACTIVE PROTEIN: CRP: 0.2 mg/dL — ABNORMAL LOW (ref 0.5–20.0)

## 2018-12-15 LAB — HEPATIC FUNCTION PANEL
ALT: 15 U/L (ref 0–35)
AST: 15 U/L (ref 0–37)
Albumin: 4.6 g/dL (ref 3.5–5.2)
Alkaline Phosphatase: 65 U/L (ref 39–117)
Bilirubin, Direct: 0.1 mg/dL (ref 0.0–0.3)
Total Bilirubin: 0.7 mg/dL (ref 0.2–1.2)
Total Protein: 6.5 g/dL (ref 6.0–8.3)

## 2018-12-15 LAB — CK: Total CK: 99 U/L (ref 7–177)

## 2018-12-15 LAB — TSH: TSH: 0.94 u[IU]/mL (ref 0.35–4.50)

## 2018-12-15 NOTE — Patient Instructions (Signed)
Try off the Crestor for 3-4 weeks and if better add back  3 days per week and if ok then increase back to dialy as tolerated   Will notify you  of labs when available.  Agree with seeing rheumatology   One task at a time  Will help your memory    Preventive Care 72 Years and Older, Female Preventive care refers to lifestyle choices and visits with your health care provider that can promote health and wellness. What does preventive care include?  A yearly physical exam. This is also called an annual well check.  Dental exams once or twice a year.  Routine eye exams. Ask your health care provider how often you should have your eyes checked.  Personal lifestyle choices, including: ? Daily care of your teeth and gums. ? Regular physical activity. ? Eating a healthy diet. ? Avoiding tobacco and drug use. ? Limiting alcohol use. ? Practicing safe sex. ? Taking low-dose aspirin every day. ? Taking vitamin and mineral supplements as recommended by your health care provider. What happens during an annual well check? The services and screenings done by your health care provider during your annual well check will depend on your age, overall health, lifestyle risk factors, and family history of disease. Counseling Your health care provider may ask you questions about your:  Alcohol use.  Tobacco use.  Drug use.  Emotional well-being.  Home and relationship well-being.  Sexual activity.  Eating habits.  History of falls.  Memory and ability to understand (cognition).  Work and work Statistician.  Reproductive health.  Screening You may have the following tests or measurements:  Height, weight, and BMI.  Blood pressure.  Lipid and cholesterol levels. These may be checked every 5 years, or more frequently if you are over 64 years old.  Skin check.  Lung cancer screening. You may have this screening every year starting at age 10 if you have a 30-pack-year history of  smoking and currently smoke or have quit within the past 15 years.  Colorectal cancer screening. All adults should have this screening starting at age 35 and continuing until age 62. You will have tests every 1-10 years, depending on your results and the type of screening test. People at increased risk should start screening at an earlier age. Screening tests may include: ? Guaiac-based fecal occult blood testing. ? Fecal immunochemical test (FIT). ? Stool DNA test. ? Virtual colonoscopy. ? Sigmoidoscopy. During this test, a flexible tube with a tiny camera (sigmoidoscope) is used to examine your rectum and lower colon. The sigmoidoscope is inserted through your anus into your rectum and lower colon. ? Colonoscopy. During this test, a long, thin, flexible tube with a tiny camera (colonoscope) is used to examine your entire colon and rectum.  Hepatitis C blood test.  Hepatitis B blood test.  Sexually transmitted disease (STD) testing.  Diabetes screening. This is done by checking your blood sugar (glucose) after you have not eaten for a while (fasting). You may have this done every 1-3 years.  Bone density scan. This is done to screen for osteoporosis. You may have this done starting at age 34.  Mammogram. This may be done every 1-2 years. Talk to your health care provider about how often you should have regular mammograms. Talk with your health care provider about your test results, treatment options, and if necessary, the need for more tests. Vaccines Your health care provider may recommend certain vaccines, such as:  Influenza vaccine. This  is recommended every year.  Tetanus, diphtheria, and acellular pertussis (Tdap, Td) vaccine. You may need a Td booster every 10 years.  Varicella vaccine. You may need this if you have not been vaccinated.  Zoster vaccine. You may need this after age 49.  Measles, mumps, and rubella (MMR) vaccine. You may need at least one dose of MMR if you  were born in 1957 or later. You may also need a second dose.  Pneumococcal 13-valent conjugate (PCV13) vaccine. One dose is recommended after age 35.  Pneumococcal polysaccharide (PPSV23) vaccine. One dose is recommended after age 77.  Meningococcal vaccine. You may need this if you have certain conditions.  Hepatitis A vaccine. You may need this if you have certain conditions or if you travel or work in places where you may be exposed to hepatitis A.  Hepatitis B vaccine. You may need this if you have certain conditions or if you travel or work in places where you may be exposed to hepatitis B.  Haemophilus influenzae type b (Hib) vaccine. You may need this if you have certain conditions. Talk to your health care provider about which screenings and vaccines you need and how often you need them. This information is not intended to replace advice given to you by your health care provider. Make sure you discuss any questions you have with your health care provider. Document Released: 12/12/2015 Document Revised: 01/05/2018 Document Reviewed: 09/16/2015 Elsevier Interactive Patient Education  2019 [employer].

## 2018-12-18 LAB — CYCLIC CITRUL PEPTIDE ANTIBODY, IGG
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
## 2018-12-25 ENCOUNTER — Encounter: Payer: Self-pay | Admitting: Rheumatology

## 2018-12-25 ENCOUNTER — Ambulatory Visit (INDEPENDENT_AMBULATORY_CARE_PROVIDER_SITE_OTHER): Payer: 59

## 2018-12-25 ENCOUNTER — Ambulatory Visit (INDEPENDENT_AMBULATORY_CARE_PROVIDER_SITE_OTHER): Payer: Self-pay

## 2018-12-25 ENCOUNTER — Ambulatory Visit: Payer: 59 | Admitting: Rheumatology

## 2018-12-25 VITALS — BP 124/72 | HR 71 | Resp 12 | Ht 64.0 in | Wt 148.0 lb

## 2018-12-25 DIAGNOSIS — Z8719 Personal history of other diseases of the digestive system: Secondary | ICD-10-CM

## 2018-12-25 DIAGNOSIS — M79642 Pain in left hand: Secondary | ICD-10-CM | POA: Diagnosis not present

## 2018-12-25 DIAGNOSIS — Z8709 Personal history of other diseases of the respiratory system: Secondary | ICD-10-CM

## 2018-12-25 DIAGNOSIS — Z8669 Personal history of other diseases of the nervous system and sense organs: Secondary | ICD-10-CM

## 2018-12-25 DIAGNOSIS — Z8639 Personal history of other endocrine, nutritional and metabolic disease: Secondary | ICD-10-CM

## 2018-12-25 DIAGNOSIS — M79641 Pain in right hand: Secondary | ICD-10-CM

## 2018-12-25 DIAGNOSIS — G3184 Mild cognitive impairment, so stated: Secondary | ICD-10-CM

## 2018-12-25 DIAGNOSIS — G4701 Insomnia due to medical condition: Secondary | ICD-10-CM

## 2018-12-25 DIAGNOSIS — M542 Cervicalgia: Secondary | ICD-10-CM

## 2018-12-25 DIAGNOSIS — M25562 Pain in left knee: Secondary | ICD-10-CM

## 2018-12-25 DIAGNOSIS — M4185 Other forms of scoliosis, thoracolumbar region: Secondary | ICD-10-CM

## 2018-12-25 DIAGNOSIS — G4733 Obstructive sleep apnea (adult) (pediatric): Secondary | ICD-10-CM

## 2018-12-25 DIAGNOSIS — M25561 Pain in right knee: Secondary | ICD-10-CM | POA: Diagnosis not present

## 2018-12-25 DIAGNOSIS — Z86010 Personal history of colonic polyps: Secondary | ICD-10-CM

## 2018-12-25 DIAGNOSIS — G8929 Other chronic pain: Secondary | ICD-10-CM

## 2018-12-25 DIAGNOSIS — Z9989 Dependence on other enabling machines and devices: Secondary | ICD-10-CM

## 2018-12-25 DIAGNOSIS — Z8739 Personal history of other diseases of the musculoskeletal system and connective tissue: Secondary | ICD-10-CM

## 2018-12-25 DIAGNOSIS — G540 Brachial plexus disorders: Secondary | ICD-10-CM

## 2018-12-25 MED ORDER — DICLOFENAC SODIUM 1 % TD GEL: TRANSDERMAL | 3 refills | Status: DC

## 2018-12-25 NOTE — Patient Instructions (Signed)
Knee Exercises                        Ask your health care provider which exercises are safe for you. Do exercises exactly as told by your health care provider and adjust them as directed. It is normal to feel mild stretching, pulling, tightness, or discomfort as you do these exercises, but you should stop right away if you feel sudden pain or your pain gets worse.Do not begin these exercises until told by your health care provider.  STRETCHING AND RANGE OF MOTION EXERCISES  These exercises warm up your muscles and joints and improve the movement and flexibility of your knee. These exercises also help to relieve pain, numbness, and tingling.  Exercise A: Knee Extension, Prone  1. Lie on your abdomen on a bed.  2. Place your left / right knee just beyond the edge of the surface so your knee is not on the bed. You can put a towel under your left / right thigh just above your knee for comfort.  3. Relax your leg muscles and allow gravity to straighten your knee. You should feel a stretch behind your left / right knee.  4. Hold this position for __________ seconds.  5. Scoot up so your knee is supported between repetitions.  Repeat __________ times. Complete this stretch __________ times a day.  Exercise B: Knee Flexion, Active  1. Lie on your back with both knees straight. If this causes back discomfort, bend your left / right knee so your foot is flat on the floor.  2. Slowly slide your left / right heel back toward your buttocks until you feel a gentle stretch in the front of your knee or thigh.  3. Hold this position for __________ seconds.  4. Slowly slide your left / right heel back to the starting position.  Repeat __________ times. Complete this exercise __________ times a day.  Exercise C: Quadriceps, Prone  1. Lie on your abdomen on a firm surface, such as a bed or padded floor.  2. Bend your left / right knee and hold your ankle. If you cannot reach your ankle or pant leg, loop a belt around your foot and  grab the belt instead.  3. Gently pull your heel toward your buttocks. Your knee should not slide out to the side. You should feel a stretch in the front of your thigh and knee.  4. Hold this position for __________ seconds.  Repeat __________ times. Complete this stretch __________ times a day.  Exercise D: Hamstring, Supine  1. Lie on your back.  2. Loop a belt or towel over the ball of your left / right foot. The ball of your foot is on the walking surface, right under your toes.  3. Straighten your left / right knee and slowly pull on the belt to raise your leg until you feel a gentle stretch behind your knee.  ? Do not let your left / right knee bend while you do this.  ? Keep your other leg flat on the floor.  4. Hold this position for __________ seconds.  Repeat __________ times. Complete this stretch __________ times a day.  STRENGTHENING EXERCISES  These exercises build strength and endurance in your knee. Endurance is the ability to use your muscles for a long time, even after they get tired.  Exercise E: Quadriceps, Isometric  1. Lie on your back with your left / right leg extended and your   other knee bent. Put a rolled towel or small pillow under your knee if told by your health care provider.  2. Slowly tense the muscles in the front of your left / right thigh. You should see your kneecap slide up toward your hip or see increased dimpling just above the knee. This motion will push the back of the knee toward the floor.  3. For __________ seconds, keep the muscle as tight as you can without increasing your pain.  4. Relax the muscles slowly and completely.  Repeat __________ times. Complete this exercise __________ times a day.  Exercise F: Straight Leg Raises - Quadriceps  1. Lie on your back with your left / right leg extended and your other knee bent.  2. Tense the muscles in the front of your left / right thigh. You should see your kneecap slide up or see increased dimpling just above the knee. Your  thigh may even shake a bit.  3. Keep these muscles tight as you raise your leg 4-6 inches (10-15 cm) off the floor. Do not let your knee bend.  4. Hold this position for __________ seconds.  5. Keep these muscles tense as you lower your leg.  6. Relax your muscles slowly and completely after each repetition.  Repeat __________ times. Complete this exercise __________ times a day.  Exercise G: Hamstring, Isometric  1. Lie on your back on a firm surface.  2. Bend your left / right knee approximately __________ degrees.  3. Dig your left / right heel into the surface as if you are trying to pull it toward your buttocks. Tighten the muscles in the back of your thighs to dig as hard as you can without increasing any pain.  4. Hold this position for __________ seconds.  5. Release the tension gradually and allow your muscles to relax completely for __________ seconds after each repetition.  Repeat __________ times. Complete this exercise __________ times a day.  Exercise H: Hamstring Curls  If told by your health care provider, do this exercise while wearing ankle weights. Begin with __________ weights. Then increase the weight by 1 lb (0.5 kg) increments. Do not wear ankle weights that are more than __________.  1. Lie on your abdomen with your legs straight.  2. Bend your left / right knee as far as you can without feeling pain. Keep your hips flat against the floor.  3. Hold this position for __________ seconds.  4. Slowly lower your leg to the starting position.  Repeat __________ times. Complete this exercise __________ times a day.  Exercise I: Squats (Quadriceps)  1. Stand in front of a table, with your feet and knees pointing straight ahead. You may rest your hands on the table for balance but not for support.  2. Slowly bend your knees and lower your hips like you are going to sit in a chair.  ? Keep your weight over your heels, not over your toes.  ? Keep your lower legs upright so they are parallel with the  table legs.  ? Do not let your hips go lower than your knees.  ? Do not bend lower than told by your health care provider.  ? If your knee pain increases, do not bend as low.  3. Hold the squat position for __________ seconds.  4. Slowly push with your legs to return to standing. Do not use your hands to pull yourself to standing.  Repeat __________ times. Complete this exercise __________ times a   day. Exercise K: Straight Leg Raises - Hip Abductors 1. Lie on your side with your left / right leg in the top position. Lie so your head, shoulder, knee, and hip line up. You may bend your bottom knee to help you keep your balance. 2. Roll your hips slightly forward so your hips are stacked directly over each other and your left / right knee is facing forward. 3. Leading with your heel, lift your top leg 4-6 inches (10-15 cm). You should feel the muscles in your outer hip lifting. ? Do not let your foot drift forward. ? Do not let your knee roll toward the ceiling. 4. Hold this position for __________ seconds. 5. Slowly return your leg to the starting position. 6. Let your muscles relax completely after each repetition. Repeat __________ times. Complete this exercise __________ times a day. Exercise L: Straight Leg Raises - Hip Extensors 1. Lie on your abdomen on a firm surface. You can put a pillow under your hips if that is more comfortable. 2. Tense the muscles in your buttocks and lift your left / right leg about 4-6 inches (10-15 cm). Keep your  knee straight as you lift your leg. 3. Hold this position for __________ seconds. 4. Slowly lower your leg to the starting position. 5. Let your leg relax completely after each repetition. Repeat __________ times. Complete this exercise __________ times a day. This information is not intended to replace advice given to you by your health care provider. Make sure you discuss any questions you have with your health care provider. Document Released: 09/29/2005 Document Revised: 08/09/2016 Document Reviewed: 09/21/2015 Elsevier Interactive Patient Education  2019 [employer] Cervical Strain and Sprain Rehab Ask your health care provider which exercises are safe for you. Do exercises exactly as told by your health care provider and adjust them as directed. It is normal to feel mild stretching, pulling, tightness, or discomfort as you do these exercises, but you should stop right away if you feel sudden pain or your pain gets worse.Do not begin these exercises until told by your health care provider. Stretching and range of motion exercises These exercises warm up your muscles and joints and improve the movement and flexibility of your neck. These exercises also help to relieve pain, numbness, and tingling. Exercise A: Cervical side bend  1. Using good posture, sit on a stable chair or stand up. 2. Without moving your shoulders, slowly tilt your left / right ear to your shoulder until you feel a stretch in your neck muscles. You should be looking straight ahead. 3. Hold for __________ seconds. 4. Repeat with the other side of your neck. Repeat __________ times. Complete this exercise __________ times a day. Exercise B: Cervical rotation  1. Using good posture, sit on a stable chair or stand up. 2. Slowly turn your head to the side as if you are looking over your left / right shoulder. ? Keep your eyes level with the ground. ? Stop when you feel a stretch along the side and the back of your  neck. 3. Hold for __________ seconds. 4. Repeat this by turning to your other side. Repeat __________ times. Complete this exercise __________ times a day. Exercise C: Thoracic extension and pectoral stretch 1. Roll a towel or a small blanket so it is about 4 inches (10 cm) in diameter. 2. Lie down on your back on a firm surface. 3. Put the towel lengthwise, under your spine in the middle of your back. It  should not be not under your shoulder blades. The towel should line up with your spine from your middle back to your lower back. 4. Put your hands behind your head and let your elbows fall out to your sides. 5. Hold for __________ seconds. Repeat __________ times. Complete this exercise __________ times a day. Strengthening exercises These exercises build strength and endurance in your neck. Endurance is the ability to use your muscles for a long time, even after your muscles get tired. Exercise D: Upper cervical flexion, isometric 1. Lie on your back with a thin pillow behind your head and a small rolled-up towel under your neck. 2. Gently tuck your chin toward your chest and nod your head down to look toward your feet. Do not lift your head off the pillow. 3. Hold for __________ seconds. 4. Release the tension slowly. Relax your neck muscles completely before you repeat this exercise. Repeat __________ times. Complete this exercise __________ times a day. Exercise E: Cervical extension, isometric  1. Stand about 6 inches (15 cm) away from a wall, with your back facing the wall. 2. Place a soft object, about 6-8 inches (15-20 cm) in diameter, between the back of your head and the wall. A soft object could be a small pillow, a ball, or a folded towel. 3. Gently tilt your head back and press into the soft object. Keep your jaw and forehead relaxed. 4. Hold for __________ seconds. 5. Release the tension slowly. Relax your neck muscles completely before you repeat this exercise. Repeat  __________ times. Complete this exercise __________ times a day. Posture and body mechanics Body mechanics refers to the movements and positions of your body while you do your daily activities. Posture is part of body mechanics. Good posture and healthy body mechanics can help to relieve stress in your body's tissues and joints. Good posture means that your spine is in its natural S-curve position (your spine is neutral), your shoulders are pulled back slightly, and your head is not tipped forward. The following are general guidelines for applying improved posture and body mechanics to your everyday activities. Standing   When standing, keep your spine neutral and keep your feet about hip-width apart. Keep a slight bend in your knees. Your ears, shoulders, and hips should line up.  When you do a task in which you stand in one place for a long time, place one foot up on a stable object that is 2-4 inches (5-10 cm) high, such as a footstool. This helps keep your spine neutral. Sitting   When sitting, keep your spine neutral and your keep feet flat on the floor. Use a footrest, if necessary, and keep your thighs parallel to the floor. Avoid rounding your shoulders, and avoid tilting your head forward.  When working at a desk or a computer, keep your desk at a height where your hands are slightly lower than your elbows. Slide your chair under your desk so you are close enough to maintain good posture.  When working at a computer, place your monitor at a height where you are looking straight ahead and you do not have to tilt your head forward or downward to look at the screen. Resting When lying down and resting, avoid positions that are most painful for you. Try to support your neck in a neutral position. You can use a contour pillow or a small rolled-up towel. Your pillow should support your neck but not push on it. This information is not intended to  replace advice given to you by your health care  provider. Make sure you discuss any questions you have with your health care provider. Document Released: 11/15/2005 Document Revised: 07/22/2016 Document Reviewed: 10/22/2015 Elsevier Interactive Patient Education  2019 [employer] Hand Exercises Hand exercises can be helpful to almost anyone. These exercises can strengthen the hands, improve flexibility and movement, and increase blood flow to the hands. These results can make work and daily tasks easier. Hand exercises can be especially helpful for people who have joint pain from arthritis or have nerve damage from overuse (carpal tunnel syndrome). These exercises can also help people who have injured a hand. Most of these hand exercises are fairly gentle stretching routines. You can do them often throughout the day. Still, it is a good idea to ask your health care provider which exercises would be best for you. Warming your hands before exercise may help to reduce stiffness. You can do this with gentle massage or by placing your hands in warm water for 15 minutes. Also, make sure you pay attention to your level of hand pain as you begin an exercise routine. Exercises Knuckle bend Repeat this exercise 5-10 times with each hand. 5. Stand or sit with your arm, hand, and all five fingers pointed straight up. Make sure your wrist is straight. 6. Gently and slowly bend your fingers down and inward until the tips of your fingers are touching the tops of your palm. 7. Hold this position for a few seconds. 8. Extend your fingers out to their original position, all pointing straight up again. Finger fan Repeat this exercise 5-10 times with each hand. 1. Hold your arm and hand out in front of you. Keep your wrist straight. 2. Squeeze your hand into a fist. 3. Hold this position for a few seconds. 4. Edison Simon out, or spread apart, your hand and fingers as much as possible, stretching every joint fully. Tabletop Repeat this exercise 5-10 times with each  hand. 6. Stand or sit with your arm, hand, and all five fingers pointed straight up. Make sure your wrist is straight. 7. Gently and slowly bend your fingers at the knuckles where they meet the hand until your hand is making an upside-down L shape. Your fingers should form a tabletop. 8. Hold this position for a few seconds. 9. Extend your fingers out to their original position, all pointing straight up again. Making Os Repeat this exercise 5-10 times with each hand. 5. Stand or sit with your arm, hand, and all five fingers pointed straight up. Make sure your wrist is straight. 6. Make an O shape by touching your pointer finger to your thumb. Hold for a few seconds. Then open your hand wide. 7. Repeat this motion with each finger on your hand. Table spread Repeat this exercise 5-10 times with each hand. 6. Place your hand on a table with your palm facing down. Make sure your wrist is straight. 7. Spread your fingers out as much as possible. Hold this position for a few seconds. 8. Slide your fingers back together again. Hold for a few seconds. Ball grip Repeat this exercise 10-15 times with each hand. 1. Hold a tennis ball or another soft ball in your hand. 2. While slowly increasing pressure, squeeze the ball as hard as possible. 3. Squeeze as hard as you can for 3-5 seconds. 4. Relax and repeat.  Wrist curls Repeat this exercise 10-15 times with each hand. 1. Sit in a chair that has armrests. 2.  Hold a light weight in your hand, such as a dumbbell that weighs 1-3 pounds (0.5-1.4 kg). Ask your health care provider what weight would be best for you. 3. Rest your hand just over the end of the chair arm with your palm facing up. 4. Gently pivot your wrist up and down while holding the weight. Do not twist your wrist from side to side. Contact a health care provider if:  Your hand pain or discomfort gets much worse when you do an exercise.  Your hand pain or discomfort does not improve  within 2 hours after you exercise. If you have any of these problems, stop doing these exercises right away. Do not do them again unless your health care provider says that you can. Get help right away if:  You develop sudden, severe hand pain. If this happens, stop doing these exercises right away. Do not do them again unless your health care provider says that you can. This information is not intended to replace advice given to you by your health care provider. Make sure you discuss any questions you have with your health care provider. Document Released: 10/27/2015 Document Revised: 03/21/2018 Document Reviewed: 05/26/2015 Elsevier Interactive Patient Education  2019 [employer].

## 2018-12-26 NOTE — Progress Notes (Signed)
Lvm for pt to call back crm created  

## 2018-12-28 ENCOUNTER — Telehealth: Payer: Self-pay | Admitting: Internal Medicine

## 2018-12-28 NOTE — Telephone Encounter (Signed)
Copied from [place] [id number]. Topic: Quick Communication - Lab Results (Clinic Use ONLY) >> Dec 26, 2018  9:52 AM [place], [place] wrote: Called patient to inform them of 12/15/2018 lab results. When patient returns call, triage nurse may disclose results.  Pt calling back

## 2018-12-28 NOTE — Telephone Encounter (Signed)
See result note.  

## 2019-01-10 ENCOUNTER — Other Ambulatory Visit: Payer: Self-pay

## 2019-01-10 DIAGNOSIS — E785 Hyperlipidemia, unspecified: Secondary | ICD-10-CM

## 2019-01-16 ENCOUNTER — Ambulatory Visit: Payer: 59 | Admitting: Rheumatology

## 2019-01-30 ENCOUNTER — Ambulatory Visit (INDEPENDENT_AMBULATORY_CARE_PROVIDER_SITE_OTHER): Payer: 59 | Admitting: Obstetrics & Gynecology

## 2019-01-30 ENCOUNTER — Encounter: Payer: Self-pay | Admitting: Obstetrics & Gynecology

## 2019-01-30 ENCOUNTER — Other Ambulatory Visit: Payer: Self-pay

## 2019-01-30 VITALS — BP 122/64 | HR 66 | Resp 14 | Ht 64.0 in | Wt 151.0 lb

## 2019-01-30 DIAGNOSIS — Z01419 Encounter for gynecological examination (general) (routine) without abnormal findings: Secondary | ICD-10-CM | POA: Diagnosis not present

## 2019-01-30 NOTE — Progress Notes (Signed)
73 y.o. [id number] Married White or Caucasian female here for annual exam.  Had a sleep study last year that showed some sleep apnea.  She is using a CPAP.  She does not think this has helped her memory.  Dr. Brett Fairy thinks her memory is completely appropriate.  She does feel like she has a lot on her plate and is asked by her kids to do a lot of things.    Denies vaginal bleeding.     Having more joint pain during the past year.  Doesn't feel muscle related.  Has been diagnosed with osteoarthritis.    Patient's last menstrual period was 11/29/1986.          Sexually active: No.  The current method of family planning is status post hysterectomy.    Exercising: Yes.    The patient has a physically strenuous job, but has no regular exercise apart from work.  Smoker:  no  Health Maintenance: Pap:  2009 Normal  History of abnormal Pap:  no MMG:  08/08/18 BIRADS1:neg  Colonoscopy:  09/09/16 F/u 5 years BMD:   08/05/17 Osteopenia  TDaP:  2014 Pneumonia vaccine(s):  2018 Shingrix:   Zostavax completed Hep C testing: 10/08/16 Neg  Screening Labs: PCP     reports that she has never smoked. She has never used smokeless tobacco. She reports current alcohol use. She reports that she does not use drugs.  Past Medical History:  Diagnosis Date  . Allergy   . Arthritis    hands, lower back  . Bronchitis    uses inhaler prn  . Cataract    just being monitored - no surgery  . Colitis 8/04  . Diverticulitis   . GERD (gastroesophageal reflux disease)    diet controlled, no meds  . History of migraine headaches   . Hx of colonic polyp   . Hx of varicella   . Insomnia   . 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 Mitral valve prolapse   . Osteopenia   . Scoliosis 2005  . Sleep apnea   . Tick bite 2014   STARI-antibiotics x 5 weeks    Past Surgical History:  Procedure Laterality Date  . ABDOMINAL HYSTERECTOMY     partial  for pain no cancer    . APPENDECTOMY    . BREAST BIOPSY Right    x 2   .  CESAREAN SECTION     x4  . CHOLECYSTECTOMY    . colon polyps    . COLONOSCOPY    . DILATION AND CURETTAGE OF UTERUS     x 2  . WISDOM TOOTH EXTRACTION      Current Outpatient Medications  Medication Sig Dispense Refill  . diclofenac sodium (VOLTAREN) 1 % GEL 3 grams to 3 large joints up to 3 times daily 3 Tube 3  . pseudoephedrine-acetaminophen (TYLENOL SINUS) 30-500 MG TABS Take 1 tablet by mouth as needed.    . rosuvastatin (CRESTOR) 10 MG tablet TAKE 1 TABLET(10 MG) BY MOUTH DAILY 90 tablet 1  . Vitamin D, Ergocalciferol, (DRISDOL) 1.25 MG (50000 UT) CAPS capsule TAKE ONE CAPSULE BY MOUTH EVERY 2 WEEKS 2 capsule 2   No current facility-administered medications for this visit.     Family History  Problem Relation Age of Onset  . Diabetes Mother   . Pulmonary fibrosis Mother        16 deceased  . Hypertension Mother   . Hypertension Father   . Alzheimer's disease Father   . Kidney  failure Father   . Diabetes Maternal Grandmother   . Cancer Maternal Grandfather        ??  . Hypertension Brother   . Diabetes Sister   . Diabetes Son   . Allergy (severe) Son   . Depression Son   . Thyroid disease Daughter   . Psoriasis Daughter   . Thyroid disease Daughter   . Colon cancer Neg Hx   . Esophageal cancer Neg Hx   . Rectal cancer Neg Hx   . Stomach cancer Neg Hx     Review of Systems  HENT: Positive for sinus pain.   Respiratory:       Sinusitis   Genitourinary:       Loss of urine with cough or sneeze    Exam:   Vitals:   01/30/19 1025  BP: 122/64  Pulse: 66  Resp: 14    General appearance: alert, cooperative and appears stated age Head: Normocephalic, without obvious abnormality, atraumatic Neck: no adenopathy, supple, symmetrical, trachea midline and thyroid normal to inspection and palpation Lungs: clear to auscultation bilaterally Breasts: normal appearance, no masses or tenderness Heart: regular rate and rhythm Abdomen: soft, non-tender; bowel sounds  normal; no masses,  no organomegaly Extremities: extremities normal, atraumatic, no cyanosis or edema Skin: Skin color, texture, turgor normal. No rashes or lesions Lymph nodes: Cervical, supraclavicular, and axillary nodes normal. No abnormal inguinal nodes palpated Neurologic: Grossly normal   Pelvic: External genitalia:  no lesions              Urethra:  normal appearing urethra with no masses, tenderness or lesions              Bartholins and Skenes: normal                 Vagina: normal appearing vagina with normal color and discharge, no lesions              Cervix: absent              Pap taken: No. Bimanual Exam:  Uterus:  uterus absent              Adnexa: no mass, fullness, tenderness               Rectovaginal: Confirms               Anus:  normal sphincter tone, no lesions  Chaperone was present for exam.  A:  Well Woman with normal exam PMP, no HRT Osteopenia H/O mitral valve prolapse H/O TAH due to chronic pain 2/88 H/o colon polyps H/O low Vit D  Elevated lipids  P:   Mammogram guidelines reviewed.  Doing yearly. pap smear not indicated due to TAH hx. Lab work UTD with Dr. Regis Bill Not sure she wants to have the Shingrix vaccination.  Other vaccines are UTD. Colonoscopy UTD return annually or prn

## 2019-02-18 ENCOUNTER — Other Ambulatory Visit: Payer: Self-pay | Admitting: Obstetrics & Gynecology

## 2019-02-19 NOTE — Telephone Encounter (Signed)
Medication refill request: vitamin d 50,000iu Last AEX:  01-30-2019 Next AEX: 06-17-2020 Last MMG (if hormonal medication request): n/a Refill authorized:not refilled at [facility] or lab checked. Please approve or deny as appropriate.

## 2019-02-21 ENCOUNTER — Other Ambulatory Visit: Payer: Self-pay | Admitting: Obstetrics & Gynecology

## 2019-02-21 NOTE — Telephone Encounter (Signed)
Medication refill request: Vit D  Last AEX:  01/30/19 SM Next AEX: 06/17/20 SM  Last MMG (if hormonal medication request): 08/08/18  Refill authorized: 02/20/19 #12caps/0R to [facility]

## 2019-05-14 ENCOUNTER — Ambulatory Visit: Payer: 59 | Admitting: Nurse Practitioner

## 2019-06-22 ENCOUNTER — Other Ambulatory Visit: Payer: Self-pay

## 2019-06-22 DIAGNOSIS — Z20822 Contact with and (suspected) exposure to covid-19: Secondary | ICD-10-CM

## 2019-06-25 LAB — NOVEL CORONAVIRUS, NAA: SARS-CoV-2, NAA: NOT DETECTED

## 2019-07-24 ENCOUNTER — Telehealth: Payer: Self-pay | Admitting: Obstetrics & Gynecology

## 2019-07-24 NOTE — Telephone Encounter (Signed)
Spoke with patient. Advised as seen below. Advised next BMD due 07/2020 -07/2021.  Questions answered.   Last AEX 01/30/19 Last BMD at [facility] on 08/05/17, Osteopenia, repeat 3-40yrs.    Routing to provider for final review. Patient is agreeable to disposition. Will close encounter.

## 2019-07-24 NOTE — Telephone Encounter (Signed)
Patient's last bone density was in 2018 and she want to know if she is due for one this year.

## 2019-07-28 ENCOUNTER — Other Ambulatory Visit: Payer: Self-pay | Admitting: Obstetrics & Gynecology

## 2019-07-30 NOTE — Telephone Encounter (Signed)
Medication refill request: Vitamin D Last AEX:  01/30/2019 SM Next AEX: none Last MMG (if hormonal medication request): 08/08/2018 BIRADS 1 Negative Density B Refill authorized: Pending authorization. #[id number] with no refills if appropriate. Please advise.

## 2019-08-18 ENCOUNTER — Ambulatory Visit: Payer: 59

## 2019-09-25 ENCOUNTER — Ambulatory Visit: Payer: 59

## 2019-10-15 ENCOUNTER — Other Ambulatory Visit: Payer: Self-pay | Admitting: Internal Medicine

## 2019-11-26 ENCOUNTER — Telehealth: Payer: Self-pay | Admitting: *Deleted

## 2019-11-26 NOTE — Telephone Encounter (Signed)
Patient called for assistance with covid test scheduling.

## 2019-11-27 ENCOUNTER — Ambulatory Visit: Payer: 59 | Attending: Internal Medicine

## 2019-11-27 DIAGNOSIS — Z20822 Contact with and (suspected) exposure to covid-19: Secondary | ICD-10-CM

## 2019-11-29 LAB — NOVEL CORONAVIRUS, NAA: SARS-CoV-2, NAA: NOT DETECTED

## 2019-12-12 ENCOUNTER — Ambulatory Visit: Payer: 59 | Admitting: Adult Health

## 2019-12-17 NOTE — Progress Notes (Signed)
Chief Complaint  Patient presents with   Annual Exam    pt is concerned about spot on finger and about hair loss     HPI: Leslie Reed 74 y.o. comes in today for Preventive Medicare exam/ wellness visit medicine evaluation and a number of other issues.  Dr Diamantina Monks looked at the nodule on her left ring finger that is bothering him a bit but has been there a while and advise she see a hand person if it was progressive.  There was no injury or skin changes HLD: Is taking rosuvastatin 3 times a week at this time and seems to be tolerating it well no change in her musculoskeletal symptoms.  She felt that she had had more symptoms on the daily regimen but not certainly obvious Has seen Dr. Beacher May      Memory concerns .   Passes all tests.  But has follow up.  She seems to get not lost but have a hard to figure out places that she should have easily retrieved in memory.  .  Cataract  Surgery  She has ongoing chornic cough and  Sinus drainage .    ? From sleep machine.    Perhaps some less exercise tolerance in the last year some shortness of breath and some  After 2 stairs.   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  Is physically active because of this feels that she does not have any new inhalant exposures. She feels her hair is thinning in the recent past on the top without any [place] scalp problems and has always had thick hair.   Health Maintenance  Topic Date Due   MAMMOGRAM  08/13/2021   TETANUS/TDAP  04/19/2023   COLONOSCOPY  09/09/2026   INFLUENZA VACCINE  Completed   DEXA SCAN  Completed   Hepatitis C Screening  Completed   PNA vac Low Risk Adult  Completed   Health Maintenance Review LIFESTYLE:  Exercise: Active Tobacco/ETS:n Alcohol: n sig Sugar beverages:nSleep:y Drug use: no HH: 2    Preventive parameters:  Reviewed Shingrix vaccine discussed as well as Covid vaccine  ADLS:   There are no problems or need for  assistance  driving, feeding, obtaining food, dressing, toileting and bathing, managing money using phone. She is independent.     ROS:  GEN/ HEENT: No fever, significant weight changes sweats headaches vision problems  CV/ PULM; No chest pain syncope,edema see above change in exercise tolerance. GI /GU: No adominal pain, vomiting, change in bowel habits. No blood in the stool. No significant GU symptoms. SKIN/HEME: ,no acute skin rashes suspicious lesions or bleeding. No lymphadenopathy masses.  NEURO/ PSYCH:  No neurologic signs such as weakness numbness.  Concerns about memory see above IMM/ Allergy: No unusual infections.  Allergy .   REST of 12 system review negative except as per HPI   Past Medical History:  Diagnosis Date   Allergy    Arthritis    hands, lower back   Bronchitis    uses inhaler prn   Cataract    just being monitored - no surgery   Colitis 8/04   Diverticulitis    GERD (gastroesophageal reflux disease)    diet controlled, no meds   History of migraine headaches    Hx of colonic polyp    Hx of varicella    Insomnia    Interstitial cystitis 10/90   Mitral valve prolapse  Osteopenia    Scoliosis 2005   Sleep apnea    Tick bite 2014   STARI-antibiotics x 5 weeks    Family History  Problem Relation Age of Onset   Diabetes Mother    Pulmonary fibrosis Mother        57 deceased   Hypertension Mother    Hypertension Father    Alzheimer's disease Father    Kidney failure Father    Diabetes Maternal Grandmother    Cancer Maternal Grandfather        ??   Hypertension Brother    Diabetes Sister    Diabetes Son    Allergy (severe) Son    Depression Son    Thyroid disease Daughter    Psoriasis Daughter    Thyroid disease Daughter    Colon cancer Neg Hx    Esophageal cancer Neg Hx    Rectal cancer Neg Hx    Stomach cancer Neg Hx     Social History   Socioeconomic History   Marital status: Married     Spouse name: Not on file   Number of children: Not on file   Years of education: Not on file   Highest education level: Not on file  Occupational History   Not on file  Tobacco Use   Smoking status: Never Smoker   Smokeless tobacco: Never Used  Substance and Sexual Activity   Alcohol use: Yes    Comment: occ   Drug use: No   Sexual activity: Yes    Partners: Male    Birth control/protection: Surgical, Post-menopausal    Comment: TAH  Other Topics Concern   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   hhof 2   G6 p4    Pet cats    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    [address] 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   Neg td    Sleep 4-6 interrupted    Family is WISC father with alzheimers   Social Determinants of Health   Financial Resource Strain:    Difficulty of Paying Living Expenses: Not on file  Food Insecurity:    Worried About Charity fundraiser in the Last Year: Not on file   [place] in the Last Year: Not on file  Transportation Needs:    Lack of Transportation (Medical): Not on file   Lack of Transportation (Non-Medical): Not on file  Physical Activity:    Days of Exercise per Week: Not on file   Minutes of Exercise per Session: Not on file  Stress:    Feeling of Stress : Not on file  Social Connections:    Frequency of Communication with Friends and Family: Not on file   Frequency of Social Gatherings with Friends and Family: Not on file   Attends Religious Services: Not on file   Active Member of Clubs or Organizations: Not on file   Attends Club or Organization Meetings: Not on file   Marital Status: Not on file    Outpatient Encounter Medications as of 12/18/2019  Medication Sig   rosuvastatin (CRESTOR) 10 MG tablet TAKE 1 TABLET BY MOUTH DAILY (Patient taking differently: Take 10 mg by mouth. taking 3 days per week)   triamcinolone (KENALOG) 0.025 % cream Apply 1 application topically 2  (two) times daily.   Vitamin D, Ergocalciferol, (DRISDOL) 1.25 MG (50000 UT) CAPS capsule TAKE ONE CAPSULE BY MOUTH EVERY 2 WEEKS.   No facility-administered encounter  medications on file as of 12/18/2019.    EXAM:  BP 130/68 (BP Location: Right Arm, Patient Position: Sitting, Cuff Size: Normal)    Pulse 77    Temp 97.9 F (36.6 C) (Temporal)    Ht 5\' 4"  (1.626 m)    Wt 137 lb (62.1 kg)    LMP 11/29/1986    SpO2 97%    BMI 23.52 kg/m   Body mass index is 23.52 kg/m.  Physical Exam: Vital signs reviewed [id number] is a well-developed well-nourished alert cooperative   who appears stated age in no acute distress.  HEENT: normocephalic atraumatic , Eyes: PERRL EOM's full, conjunctiva clear, , Ears: no deformity EAC's clear TMs with normal landmarks. Mouth: clear OP,masked NECK: supple without masses, thyromegaly or bruits. CHEST/PULM:  Clear to auscultation and percussion breath sounds equal no wheeze , rales or rhonchi. No chest wall deformities or tenderness. CV: PMI is nondisplaced, S1 S2 no gallops, murmurs, rubs. Peripheral pulses are full without delay.No JVD .  ABDOMEN: Bowel sounds normal nontender  No guard or rebound, no hepato splenomegal no CVA tenderness.   Extremtities:  No clubbing cyanosis or edema, no acute joint swelling or redness no focal atrophy left ring finger with mobile cystic struction less than pea sized oon radial side pip jt no rom no pain NEURO:  Oriented x3, cranial nerves 3-12 appear to be intact, no obvious focal weakness,gait within normal limits no abnormal reflexes or asymmetrical SKIN: No acute rashes normal turgor, color, no bruising or petechiae. cratc are right shin  Scalp  Is clear  No alopecia PSYCH: Oriented, good eye contact, no obvious depression anxiety, cognition and judgment appear normal. LN: no cervical axillary inguinal adenopathy No noted deficits in memory, attention, and speech.   Lab Results  Component Value Date   WBC 6.8 12/18/2019     HGB 14.2 12/18/2019   HCT 42.6 12/18/2019   PLT 296.0 12/18/2019   GLUCOSE 86 12/18/2019   CHOL 166 12/18/2019   TRIG 107.0 12/18/2019   HDL 55.40 12/18/2019   LDLDIRECT 138.0 10/13/2011   LDLCALC 89 12/18/2019   ALT 17 12/18/2019   AST 15 12/18/2019   NA 141 12/18/2019   K 4.1 12/18/2019   CL 105 12/18/2019   CREATININE 0.66 12/18/2019   BUN 18 12/18/2019   CO2 25 12/18/2019   TSH 0.96 12/18/2019    ASSESSMENT AND PLAN:  Discussed the following assessment and plan:  Visit for preventive health examination  Medication management - Plan: Basic metabolic panel, CBC with Differential/Platelet, Hepatic function panel, TSH, T4, free, Lipid panel  Hyperlipidemia, unspecified hyperlipidemia type - take crestor 3 days per week  - Plan: Basic metabolic panel, CBC with Differential/Platelet, Hepatic function panel, TSH, T4, free, Lipid panel  Hair thinning - Plan: Basic metabolic panel, CBC with Differential/Platelet, Hepatic function panel, TSH, T4, free, Lipid panel  Need for pneumococcal vaccine - Plan: Pneumococcal conjugate vaccine 13-valent IM  Nodule of finger of left hand  Cough, persistent - had pfts in 2016 but no cough am and some doe with 2 flights stairs  - Plan: Ambulatory referral to Pulmonology  Memory difficulty  Decreased exercise tolerance - Plan: Ambulatory referral to Pulmonology Lab monitoring Review of records shows a past x-ray with hyperinflation in 2018 could be suggestive of emphysematous changes.  With her history of cough in the morning although on CPAP and some change in her respiratory capacity although she is quite active consider pulmonary work-up which include PFTs but at  this time we agreed on doing a pulmonary referral consult not until April.  She has many things on her plate at this time Agree the finger nodules probably a cyst and only if it is bothering her growing etc. could she can see a Copy. In regard to her memory difficult to  ascertain what is going on I wonder if she could have had an ADHD type of processing issue that is now more problematic.  Would have her discuss this with Dr. Brett Fairy as to need for further evaluation. Her hair situation does not look like a disease process as her scalp is clear however will check hormonal levels at this time. Multiple issues addressed today  And plans   Going forward to address.  Required extra time  And review. Patient Care Team: Wyett Narine, Standley Brooking, MD as PCP - General (Internal Medicine) Stefanie Libel, MD (Family Medicine) Megan Salon, MD (Gynecology) Dohmeier, Asencion Partridge, MD (Neurology)  Patient Instructions   Your exam is reassuring.   Will let you know lab results when available . Will  Arrange pulmonary consult  For April or after  You could have some underlying  COPD underying sx  Or reactive and sometimes inhalers may help.  Suspect inhalant exposures may also be an issue.  Fu with Dr D about memory  As discussed . Checking thyroid levels  And if hair thinning   Worsening can see Dr Delman Cheadle  But this could be age genetics .    The bump seems like a ganglion cyst  See a hand specialist  If causing pain or dysfunction.   Checking lab today. Shingrix when convenient   Proceed with the covid 19 vaccination    Health Maintenance, Female Adopting a healthy lifestyle and getting preventive care are important in promoting health and wellness. Ask your health care provider about:  The right schedule for you to have regular tests and exams.  Things you can do on your own to prevent diseases and keep yourself healthy. What should I know about diet, weight, and exercise? Eat a healthy diet   Eat a diet that includes plenty of vegetables, fruits, low-fat dairy products, and lean protein.  Do not eat a lot of foods that are high in solid fats, added sugars, or sodium. Maintain a healthy weight Body mass index (BMI) is used to identify weight problems. It estimates body fat  based on height and weight. Your health care provider can help determine your BMI and help you achieve or maintain a healthy weight. Get regular exercise Get regular exercise. This is one of the most important things you can do for your health. Most adults should:  Exercise for at least 150 minutes each week. The exercise should increase your heart rate and make you sweat (moderate-intensity exercise).  Do strengthening exercises at least twice a week. This is in addition to the moderate-intensity exercise.  Spend less time sitting. Even light physical activity can be beneficial. Watch cholesterol and blood lipids Have your blood tested for lipids and cholesterol at 73 years of age, then have this test every 5 years. Have your cholesterol levels checked more often if:  Your lipid or cholesterol levels are high.  You are older than 73 years of age.  You are at high risk for heart disease. What should I know about cancer screening? Depending on your health history and family history, you may need to have cancer screening at various ages. This may include screening for:  Breast  cancer.  Cervical cancer.  Colorectal cancer.  Skin cancer.  Lung cancer. What should I know about heart disease, diabetes, and high blood pressure? Blood pressure and heart disease  High blood pressure causes heart disease and increases the risk of stroke. This is more likely to develop in people who have high blood pressure readings, are of African descent, or are overweight.  Have your blood pressure checked: ? Every 3-5 years if you are 63-50 years of age. ? Every year if you are 43 years old or older. Diabetes Have regular diabetes screenings. This checks your fasting blood sugar level. Have the screening done:  Once every three years after age 19 if you are at a normal weight and have a low risk for diabetes.  More often and at a younger age if you are overweight or have a high risk for  diabetes. What should I know about preventing infection? Hepatitis B If you have a higher risk for hepatitis B, you should be screened for this virus. Talk with your health care provider to find out if you are at risk for hepatitis B infection. Hepatitis C Testing is recommended for:  Everyone born from [birth year] through 1964.  Anyone with known risk factors for hepatitis C. Sexually transmitted infections (STIs)  Get screened for STIs, including gonorrhea and chlamydia, if: ? You are sexually active and are younger than 73 years of age. ? You are older than 72 years of age and your health care provider tells you that you are at risk for this type of infection. ? Your sexual activity has changed since you were last screened, and you are at increased risk for chlamydia or gonorrhea. Ask your health care provider if you are at risk.  Ask your health care provider about whether you are at high risk for HIV. Your health care provider may recommend a prescription medicine to help prevent HIV infection. If you choose to take medicine to prevent HIV, you should first get tested for HIV. You should then be tested every 3 months for as long as you are taking the medicine. Pregnancy  If you are about to stop having your period (premenopausal) and you may become pregnant, seek counseling before you get pregnant.  Take 400 to 800 micrograms (mcg) of folic acid every day if you become pregnant.  Ask for birth control (contraception) if you want to prevent pregnancy. Osteoporosis and menopause Osteoporosis is a disease in which the bones lose minerals and strength with aging. This can result in bone fractures. If you are 55 years old or older, or if you are at risk for osteoporosis and fractures, ask your health care provider if you should:  Be screened for bone loss.  Take a calcium or vitamin D supplement to lower your risk of fractures.  Be given hormone replacement therapy (HRT) to treat symptoms of  menopause. Follow these instructions at home: Lifestyle  Do not use any products that contain nicotine or tobacco, such as cigarettes, e-cigarettes, and chewing tobacco. If you need help quitting, ask your health care provider.  Do not use street drugs.  Do not share needles.  Ask your health care provider for help if you need support or information about quitting drugs. Alcohol use  Do not drink alcohol if: ? Your health care provider tells you not to drink. ? You are pregnant, may be pregnant, or are planning to become pregnant.  If you drink alcohol: ? Limit how much you use to 0-1  drink a day. ? Limit intake if you are breastfeeding.  Be aware of how much alcohol is in your drink. In the [place] one drink equals one 12 oz bottle of beer (355 mL), one 5 oz glass of wine (148 mL), or one 1 oz glass of hard liquor (44 mL). General instructions  Schedule regular health, dental, and eye exams.  Stay current with your vaccines.  Tell your health care provider if: ? You often feel depressed. ? You have ever been abused or do not feel safe at home. Summary  Adopting a healthy lifestyle and getting preventive care are important in promoting health and wellness.  Follow your health care provider's instructions about healthy diet, exercising, and getting tested or screened for diseases.  Follow your health care provider's instructions on monitoring your cholesterol and blood pressure. This information is not intended to replace advice given to you by your health care provider. Make sure you discuss any questions you have with your health care provider. Document Revised: 11/08/2018 Document Reviewed: 11/08/2018 Elsevier Patient Education  2020 Leslie Reed M.D.

## 2019-12-18 ENCOUNTER — Ambulatory Visit (INDEPENDENT_AMBULATORY_CARE_PROVIDER_SITE_OTHER): Payer: 59 | Admitting: Internal Medicine

## 2019-12-18 ENCOUNTER — Other Ambulatory Visit: Payer: Self-pay

## 2019-12-18 ENCOUNTER — Encounter: Payer: Self-pay | Admitting: Internal Medicine

## 2019-12-18 VITALS — BP 130/68 | HR 77 | Temp 97.9°F | Ht 64.0 in | Wt 137.0 lb

## 2019-12-18 DIAGNOSIS — Z0000 Encounter for general adult medical examination without abnormal findings: Secondary | ICD-10-CM

## 2019-12-18 DIAGNOSIS — Z79899 Other long term (current) drug therapy: Secondary | ICD-10-CM | POA: Diagnosis not present

## 2019-12-18 DIAGNOSIS — Z0001 Encounter for general adult medical examination with abnormal findings: Secondary | ICD-10-CM

## 2019-12-18 DIAGNOSIS — L659 Nonscarring hair loss, unspecified: Secondary | ICD-10-CM | POA: Diagnosis not present

## 2019-12-18 DIAGNOSIS — R413 Other amnesia: Secondary | ICD-10-CM

## 2019-12-18 DIAGNOSIS — Z23 Encounter for immunization: Secondary | ICD-10-CM

## 2019-12-18 DIAGNOSIS — R053 Chronic cough: Secondary | ICD-10-CM

## 2019-12-18 DIAGNOSIS — R05 Cough: Secondary | ICD-10-CM | POA: Diagnosis not present

## 2019-12-18 DIAGNOSIS — E785 Hyperlipidemia, unspecified: Secondary | ICD-10-CM

## 2019-12-18 DIAGNOSIS — R2232 Localized swelling, mass and lump, left upper limb: Secondary | ICD-10-CM | POA: Diagnosis not present

## 2019-12-18 DIAGNOSIS — R6889 Other general symptoms and signs: Secondary | ICD-10-CM

## 2019-12-18 LAB — CBC WITH DIFFERENTIAL/PLATELET
Basophils Absolute: 0 10*3/uL (ref 0.0–0.1)
Basophils Relative: 0.4 % (ref 0.0–3.0)
Eosinophils Absolute: 0 10*3/uL (ref 0.0–0.7)
Eosinophils Relative: 0.3 % (ref 0.0–5.0)
HCT: 42.6 % (ref 36.0–46.0)
Hemoglobin: 14.2 g/dL (ref 12.0–15.0)
Lymphocytes Relative: 18.8 % (ref 12.0–46.0)
Lymphs Abs: 1.3 10*3/uL (ref 0.7–4.0)
MCHC: 33.4 g/dL (ref 30.0–36.0)
MCV: 92.4 fl (ref 78.0–100.0)
Monocytes Absolute: 0.5 10*3/uL (ref 0.1–1.0)
Monocytes Relative: 6.8 % (ref 3.0–12.0)
Neutro Abs: 5 10*3/uL (ref 1.4–7.7)
Neutrophils Relative %: 73.7 % (ref 43.0–77.0)
Platelets: 296 10*3/uL (ref 150.0–400.0)
RBC: 4.62 Mil/uL (ref 3.87–5.11)
RDW: 13.7 % (ref 11.5–15.5)
WBC: 6.8 10*3/uL (ref 4.0–10.5)

## 2019-12-18 LAB — HEPATIC FUNCTION PANEL
ALT: 17 U/L (ref 0–35)
AST: 15 U/L (ref 0–37)
Albumin: 4.7 g/dL (ref 3.5–5.2)
Alkaline Phosphatase: 53 U/L (ref 39–117)
Bilirubin, Direct: 0.1 mg/dL (ref 0.0–0.3)
Total Bilirubin: 0.5 mg/dL (ref 0.2–1.2)
Total Protein: 6.7 g/dL (ref 6.0–8.3)

## 2019-12-18 LAB — TSH: TSH: 0.96 u[IU]/mL (ref 0.35–4.50)

## 2019-12-18 LAB — BASIC METABOLIC PANEL
BUN: 18 mg/dL (ref 6–23)
CO2: 25 mEq/L (ref 19–32)
Calcium: 9.3 mg/dL (ref 8.4–10.5)
Chloride: 105 mEq/L (ref 96–112)
Creatinine, Ser: 0.66 mg/dL (ref 0.40–1.20)
GFR: 87.85 mL/min (ref >60.00)
Glucose, Bld: 86 mg/dL (ref 70–99)
Potassium: 4.1 mEq/L (ref 3.5–5.1)
Sodium: 141 mEq/L (ref 135–145)

## 2019-12-18 LAB — LIPID PANEL
Cholesterol: 166 mg/dL (ref 0–200)
HDL: 55.4 mg/dL (ref >39.00)
LDL Cholesterol: 89 mg/dL (ref 0–99)
NonHDL: 110.34
Total CHOL/HDL Ratio: 3
Triglycerides: 107 mg/dL (ref 0.0–149.0)
VLDL: 21.4 mg/dL (ref 0.0–40.0)

## 2019-12-18 LAB — T4, FREE: Free T4: 0.87 ng/dL (ref 0.60–1.60)

## 2019-12-18 NOTE — Patient Instructions (Signed)
Your exam is reassuring.   Will let you know lab results when available . Will  Arrange pulmonary consult  For April or after  You could have some underlying  COPD underying sx  Or reactive and sometimes inhalers may help.  Suspect inhalant exposures may also be an issue.  Fu with Dr D about memory  As discussed . Checking thyroid levels  And if hair thinning   Worsening can see Dr Delman Cheadle  But this could be age genetics .    The bump seems like a ganglion cyst  See a hand specialist  If causing pain or dysfunction.   Checking lab today. Shingrix when convenient   Proceed with the covid 19 vaccination    Health Maintenance, Female Adopting a healthy lifestyle and getting preventive care are important in promoting health and wellness. Ask your health care provider about:  The right schedule for you to have regular tests and exams.  Things you can do on your own to prevent diseases and keep yourself healthy. What should I know about diet, weight, and exercise? Eat a healthy diet   Eat a diet that includes plenty of vegetables, fruits, low-fat dairy products, and lean protein.  Do not eat a lot of foods that are high in solid fats, added sugars, or sodium. Maintain a healthy weight Body mass index (BMI) is used to identify weight problems. It estimates body fat based on height and weight. Your health care provider can help determine your BMI and help you achieve or maintain a healthy weight. Get regular exercise Get regular exercise. This is one of the most important things you can do for your health. Most adults should:  Exercise for at least 150 minutes each week. The exercise should increase your heart rate and make you sweat (moderate-intensity exercise).  Do strengthening exercises at least twice a week. This is in addition to the moderate-intensity exercise.  Spend less time sitting. Even light physical activity can be beneficial. Watch cholesterol and blood lipids Have your  blood tested for lipids and cholesterol at 73 years of age, then have this test every 5 years. Have your cholesterol levels checked more often if:  Your lipid or cholesterol levels are high.  You are older than 73 years of age.  You are at high risk for heart disease. What should I know about cancer screening? Depending on your health history and family history, you may need to have cancer screening at various ages. This may include screening for:  Breast cancer.  Cervical cancer.  Colorectal cancer.  Skin cancer.  Lung cancer. What should I know about heart disease, diabetes, and high blood pressure? Blood pressure and heart disease  High blood pressure causes heart disease and increases the risk of stroke. This is more likely to develop in people who have high blood pressure readings, are of African descent, or are overweight.  Have your blood pressure checked: ? Every 3-5 years if you are 30-23 years of age. ? Every year if you are 5 years old or older. Diabetes Have regular diabetes screenings. This checks your fasting blood sugar level. Have the screening done:  Once every three years after age 37 if you are at a normal weight and have a low risk for diabetes.  More often and at a younger age if you are overweight or have a high risk for diabetes. What should I know about preventing infection? Hepatitis B If you have a higher risk for hepatitis B, you  should be screened for this virus. Talk with your health care provider to find out if you are at risk for hepatitis B infection. Hepatitis C Testing is recommended for:  Everyone born from [birth year] through 1964.  Anyone with known risk factors for hepatitis C. Sexually transmitted infections (STIs)  Get screened for STIs, including gonorrhea and chlamydia, if: ? You are sexually active and are younger than 73 years of age. ? You are older than 73 years of age and your health care provider tells you that you are at risk  for this type of infection. ? Your sexual activity has changed since you were last screened, and you are at increased risk for chlamydia or gonorrhea. Ask your health care provider if you are at risk.  Ask your health care provider about whether you are at high risk for HIV. Your health care provider may recommend a prescription medicine to help prevent HIV infection. If you choose to take medicine to prevent HIV, you should first get tested for HIV. You should then be tested every 3 months for as long as you are taking the medicine. Pregnancy  If you are about to stop having your period (premenopausal) and you may become pregnant, seek counseling before you get pregnant.  Take 400 to 800 micrograms (mcg) of folic acid every day if you become pregnant.  Ask for birth control (contraception) if you want to prevent pregnancy. Osteoporosis and menopause Osteoporosis is a disease in which the bones lose minerals and strength with aging. This can result in bone fractures. If you are 29 years old or older, or if you are at risk for osteoporosis and fractures, ask your health care provider if you should:  Be screened for bone loss.  Take a calcium or vitamin D supplement to lower your risk of fractures.  Be given hormone replacement therapy (HRT) to treat symptoms of menopause. Follow these instructions at home: Lifestyle  Do not use any products that contain nicotine or tobacco, such as cigarettes, e-cigarettes, and chewing tobacco. If you need help quitting, ask your health care provider.  Do not use street drugs.  Do not share needles.  Ask your health care provider for help if you need support or information about quitting drugs. Alcohol use  Do not drink alcohol if: ? Your health care provider tells you not to drink. ? You are pregnant, may be pregnant, or are planning to become pregnant.  If you drink alcohol: ? Limit how much you use to 0-1 drink a day. ? Limit intake if you are  breastfeeding.  Be aware of how much alcohol is in your drink. In the [place] one drink equals one 12 oz bottle of beer (355 mL), one 5 oz glass of wine (148 mL), or one 1 oz glass of hard liquor (44 mL). General instructions  Schedule regular health, dental, and eye exams.  Stay current with your vaccines.  Tell your health care provider if: ? You often feel depressed. ? You have ever been abused or do not feel safe at home. Summary  Adopting a healthy lifestyle and getting preventive care are important in promoting health and wellness.  Follow your health care provider's instructions about healthy diet, exercising, and getting tested or screened for diseases.  Follow your health care provider's instructions on monitoring your cholesterol and blood pressure. This information is not intended to replace advice given to you by your health care provider. Make sure you discuss any questions you have with  your health care provider. Document Revised: 11/08/2018 Document Reviewed: 11/08/2018 Elsevier Patient Education  2020 [employer].

## 2019-12-21 NOTE — Progress Notes (Signed)
Results are normal and  in range   ldl 89 could be better but  still pretty good  can try and extra pill per week  or not if getting sx

## 2019-12-31 ENCOUNTER — Ambulatory Visit: Payer: 59

## 2020-01-06 ENCOUNTER — Ambulatory Visit: Payer: 59

## 2020-03-06 ENCOUNTER — Institutional Professional Consult (permissible substitution): Payer: 59 | Admitting: Internal Medicine

## 2020-03-06 ENCOUNTER — Encounter: Payer: Self-pay | Admitting: Critical Care Medicine

## 2020-03-06 ENCOUNTER — Other Ambulatory Visit: Payer: Self-pay

## 2020-03-06 ENCOUNTER — Ambulatory Visit: Payer: 59 | Admitting: Critical Care Medicine

## 2020-03-06 VITALS — BP 136/64 | HR 73 | Temp 98.0°F | Ht 64.0 in | Wt 135.6 lb

## 2020-03-06 DIAGNOSIS — R079 Chest pain, unspecified: Secondary | ICD-10-CM

## 2020-03-06 DIAGNOSIS — R053 Chronic cough: Secondary | ICD-10-CM

## 2020-03-06 DIAGNOSIS — R0602 Shortness of breath: Secondary | ICD-10-CM

## 2020-03-06 DIAGNOSIS — R05 Cough: Secondary | ICD-10-CM

## 2020-03-06 NOTE — Patient Instructions (Addendum)
Thank you for visiting Dr. Carlis Abbott at [facility]. We recommend the following: Orders Placed This Encounter  Procedures  . CT Chest High Resolution  . ECHOCARDIOGRAM COMPLETE  . Pulmonary function test   Orders Placed This Encounter  Procedures  . CT Chest High Resolution    First available    Standing Status:   Future    Standing Expiration Date:   05/06/2021    Order Specific Question:   ** REASON FOR EXAM (FREE TEXT)    Answer:   DOE, cough, family history of IPF    Order Specific Question:   Preferred imaging location?    Answer:   [facility]    Order Specific Question:   Radiology Contrast Protocol - do NOT remove file path    Answer:   [email or web address]  . ECHOCARDIOGRAM COMPLETE    Standing Status:   Future    Standing Expiration Date:   06/05/2021    Scheduling Instructions:     First available    Order Specific Question:   Where should this test be performed    Answer:   [facility])    Order Specific Question:   Does the patient weigh less than or greater than 250 lbs?    Answer:   Patient weighs less than 250 lbs    Order Specific Question:   Perflutren DEFINITY (image enhancing agent) should be administered unless hypersensitivity or allergy exist    Answer:   Administer Perflutren    Order Specific Question:   Reason for exam-Echo    Answer:   Chest Pain  786.50 / R07.9  . Pulmonary function test    Standing Status:   Future    Standing Expiration Date:   03/06/2021    Order Specific Question:   Where should this test be performed?    Answer:   [facility]    Order Specific Question:   Full PFT: includes the following: basic spirometry, spirometry pre & post bronchodilator, diffusion capacity (DLCO), lung volumes    Answer:   Full PFT    No orders of the defined types were placed in this encounter.   Return in about 2 weeks (around 03/20/2020).    Please do your part to reduce the spread of  COVID-19.

## 2020-03-06 NOTE — Progress Notes (Signed)
Synopsis: Referred in April 2021 for chronic cough by Panosh, Standley Brooking, MD.  Subjective:   PATIENT ID: Leslie Reed GENDER: female DOB: July 28, 1946, MRN: [id number]  Chief Complaint  Patient presents with  . Consult    Leslie Reed is a 74 year old woman who presents for evaluation of dyspnea on exertion for the past 2 years and chronic cough.  She has had a chronic cough due to postnasal drip for many years.  She has a long history of sinus headaches and sinus congestion for which she uses Vicks VapoRub (on lips, chest, under nose) which seems to resolve her symptoms.  She has tried over-the-counter medications which have not helped as well.  When she was younger she had allergy testing, revealing that she was allergic to multiple foods and environmental allergens.  She coughs up phlegm first thing when waking up, but otherwise has a dry cough throughout the day.  She thinks CPAP and weather changes, especially humid weather make her sinus symptoms worse.  She has tried albuterol in the past without relief of her symptoms.  Her shortness of breath has been slowly progressive over the last 2 years.  She is no longer able to do things like walk up 2 flights of stairs as easily she previously could.  She is very active.  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  She recently was resealing break was exposed to lots of direct dust.  She never wears a respirator.  She has been exposed to asbestos in the past.  She has never smoked or vape.  Family history is notable for IPF in her mother who died in her 71s.  Both parents were heavy smokers.  Her shortness of breath is associated with mild central chest pressure and sometimes lightheadedness.  No nausea or sweating.  No previous history of coronary artery disease.  Her father had cardiac disease.  She had a normal treadmill stress test about 15 years ago with her primary care provider.  Reviewed PCP note from 12/18/19. UTD covid, flu, pneumonia vaccines.     Past Medical History:  Diagnosis Date  . Allergic rhinitis   . Allergy   . Arthritis    hands, lower back  . Bronchitis    uses inhaler prn  . Cataract    just being monitored - no surgery  . Colitis 8/04  . Diverticulitis   . GERD (gastroesophageal reflux disease)    diet controlled, no meds  . History of migraine headaches   . Hx of colonic polyp   . Hx of varicella   . Hyperlipidemia   . Insomnia   . 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 Mitral valve prolapse   . Osteopenia   . Scoliosis 2005  . Sleep apnea   . Tick bite 2014   STARI-antibiotics x 5 weeks     Family History  Problem Relation Age of Onset  . Diabetes Mother   . Pulmonary fibrosis Mother        81 deceased  . Hypertension Mother   . Hypertension Father   . Alzheimer's disease Father   . Kidney failure Father   . Diabetes Maternal Grandmother   . Cancer Maternal Grandfather        ??  . Hypertension Brother   . Diabetes Sister   . Diabetes Son   . Allergy (severe) Son   . Depression Son   . Thyroid disease Daughter   .  Psoriasis Daughter   . Thyroid disease Daughter   . Colon cancer Neg Hx   . Esophageal cancer Neg Hx   . Rectal cancer Neg Hx   . Stomach cancer Neg Hx      Past Surgical History:  Procedure Laterality Date  . ABDOMINAL HYSTERECTOMY     partial  for pain no cancer    . APPENDECTOMY    . BREAST BIOPSY Right    x 2   . CESAREAN SECTION     x4  . CHOLECYSTECTOMY    . colon polyps    . COLONOSCOPY    . DILATION AND CURETTAGE OF UTERUS     x 2  . WISDOM TOOTH EXTRACTION      Social History   Socioeconomic History  . Marital status: Married    Spouse name: Not on file  . Number of children: Not on file  . Years of education: Not on file  . Highest education level: Not on file  Occupational History  . Not on file  Tobacco Use  . Smoking status: Never Smoker  . Smokeless tobacco: Never Used    Substance and Sexual Activity  . Alcohol use: Yes    Comment: occ  . Drug use: No  . Sexual activity: Yes    Partners: Male    Birth control/protection: Surgical, Post-menopausal    Comment: TAH  Other Topics Concern  . 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 Difficulty of Paying Living Expenses:   Food Insecurity:   . Worried About Charity fundraiser in the Last Year:   . Arboriculturist in the Last Year:   Transportation Needs:   . Film/video editor (Medical):   Marland Kitchen Lack of Transportation (Non-Medical):   Physical Activity:   . Days of Exercise per Week:   . Minutes of Exercise per Session:   Stress:   . Feeling of Stress :   Social Connections:   . Frequency of Communication with Friends and Family:   . Frequency of Social Gatherings with Friends and Family:   . Attends Religious Services:   . Active Member of Clubs or Organizations:   . Attends Archivist Meetings:   Marland Kitchen Marital Status:   Intimate Partner Violence:   . Fear of Current or Ex-Partner:   . Emotionally Abused:   Marland Kitchen Physically Abused:   . Sexually Abused:      Allergies  Allergen Reactions  . Pramoxine Hcl     REACTION: irritation on rectal area     Immunization History  Administered Date(s) Administered  . Influenza Split 08/24/2013  . Influenza, High Dose Seasonal PF 09/12/2017, 08/16/2018, 08/20/2019  . Pneumococcal Conjugate-13 12/18/2019  . Pneumococcal Polysaccharide-23 12/07/2016  . Tdap 04/18/2013    Outpatient Medications Prior to Visit  Medication Sig Dispense Refill  . rosuvastatin (CRESTOR) 10 MG tablet TAKE 1 TABLET BY MOUTH DAILY (Patient taking differently: Take 10 mg by mouth.  taking 3 days per week) 90 tablet 1  . triamcinolone (KENALOG) 0.025 % cream Apply 1 application topically 2 (two) times  daily.    . Vitamin D, Ergocalciferol, (DRISDOL) 1.25 MG (50000 UT) CAPS capsule TAKE ONE CAPSULE BY MOUTH EVERY 2 WEEKS. 12 capsule 3   No facility-administered medications prior to visit.    Review of Systems  Constitutional: Negative for chills and fever.  HENT: Positive for congestion and sinus pain.   Respiratory: Positive for cough, sputum production and shortness of breath. Negative for wheezing.   Cardiovascular: Positive for chest pain. Negative for leg swelling.  Gastrointestinal: Negative for abdominal pain, heartburn, nausea and vomiting.  Musculoskeletal: Negative for myalgias.       Hand arthritis- worse after activity  Neurological: Negative for weakness.  Endo/Heme/Allergies: Positive for environmental allergies.     Objective:   Vitals:   03/06/20 0858  BP: 136/64  Pulse: 73  Temp: 98 F (36.7 C)  TempSrc: Temporal  SpO2: 96%  Weight: 135 lb 9.6 oz (61.5 kg)  Height: 5\' 4"  (1.626 m)   96% on   RA BMI Readings from Last 3 Encounters:  03/06/20 23.28 kg/m  12/18/19 23.52 kg/m  01/30/19 25.92 kg/m   Wt Readings from Last 3 Encounters:  03/06/20 135 lb 9.6 oz (61.5 kg)  12/18/19 137 lb (62.1 kg)  01/30/19 151 lb (68.5 kg)    Physical Exam Vitals reviewed.  Constitutional:      Appearance: Normal appearance. She is not ill-appearing.     Comments: Appearing elderly woman in no acute distress  HENT:     Head: Normocephalic and atraumatic.  Eyes:     General: No scleral icterus. Cardiovascular:     Rate and Rhythm: Normal rate and regular rhythm.     Heart sounds: No murmur.  Pulmonary:     Comments: Breathing comfortably on room air, no conversational dyspnea.  Clear to auscultation bilaterally. Abdominal:     General: There is no distension.     Palpations: Abdomen is soft.     Tenderness: There is no abdominal  tenderness.  Musculoskeletal:        General: No deformity.     Cervical back: Neck supple.     Comments: Mild PIP & DIP joint enlargement without synovitis or erythema.  Lymphadenopathy:     Cervical: No cervical adenopathy.  Skin:    General: Skin is warm and dry.     Findings: No rash.  Neurological:     General: No focal deficit present.     Mental Status: She is alert.     Coordination: Coordination normal.  Psychiatric:        Mood and Affect: Mood normal.        Behavior: Behavior normal.      CBC    Component Value Date/Time   WBC 6.8 12/18/2019 1140   RBC 4.62 12/18/2019 1140   HGB 14.2 12/18/2019 1140   HGB 12.5 07/01/2015 1354   HCT 42.6 12/18/2019 1140   PLT 296.0 12/18/2019 1140   MCV 92.4 12/18/2019 1140   MCH 30.5 05/28/2014 1311   MCHC 33.4 12/18/2019 1140   RDW 13.7 12/18/2019 1140   LYMPHSABS 1.3 12/18/2019 1140   MONOABS 0.5 12/18/2019 1140   EOSABS 0.0 12/18/2019 1140   BASOSABS 0.0 12/18/2019 1140    CHEMISTRY No results for input(s): NA, K, CL, CO2, GLUCOSE, BUN, CREATININE, CALCIUM, MG, PHOS in the last 168 hours. CrCl cannot be calculated (Patient's most recent lab result is older than the maximum 21 days allowed.).   Chest Imaging- films reviewed: CXR, 2 view 12/21/2016-hyperinflation, increased interstitial  markings bilaterally, airway thickening versus bronchiectasis left upper lobe.  Pulmonary Functions Testing Results: PFT Results Latest Ref Rng & Units 07/07/2016  FVC-Pre L 3.02  FVC-Predicted Pre % 99  FVC-Post L 3.08  FVC-Predicted Post % 100  Pre FEV1/FVC % % 84  Post FEV1/FCV % % 85  FEV1-Pre L 2.54  FEV1-Predicted Pre % 109  FEV1-Post L 2.60  DLCO UNC% % 90  DLCO COR %Predicted % 98  TLC L 5.20  TLC % Predicted % 102  RV % Predicted % 100   2017- no significant obstruction or bronchodilator reversibility.  No restriction.  Normal diffusion.      Assessment & Plan:     ICD-10-CM   1. Chronic cough  R05 Pulmonary  function test  2. Shortness of breath  R06.02 CT Chest High Resolution    Pulmonary function test    ECHOCARDIOGRAM COMPLETE  3. Chest pain, unspecified type  R07.9 Pulmonary function test    ECHOCARDIOGRAM COMPLETE    Dyspnea on exertion and chronic cough.  Concerning with her family history of ILD in her mother.  Occupational exposures to dust without appropriate respirator use is also concerning. -HRCT chest -PFTs  Chest pain, no significant risk factors for coronary artery disease other than age. -Echocardiogram.  Depending on results of these tests will determine if she needs stress testing.   RTC in 2 weeks.   Current Outpatient Medications:  .  rosuvastatin (CRESTOR) 10 MG tablet, TAKE 1 TABLET BY MOUTH DAILY (Patient taking differently: Take 10 mg by mouth. taking 3 days per week), Disp: 90 tablet, Rfl: 1 .  triamcinolone (KENALOG) 0.025 % cream, Apply 1 application topically 2 (two) times daily., Disp: , Rfl:  .  Vitamin D, Ergocalciferol, (DRISDOL) 1.25 MG (50000 UT) CAPS capsule, TAKE ONE CAPSULE BY MOUTH EVERY 2 WEEKS., Disp: 12 capsule, Rfl: 3     Julian Hy, DO [facility] 03/06/2020 9:36 AM

## 2020-03-11 ENCOUNTER — Other Ambulatory Visit (HOSPITAL_COMMUNITY)
Admission: RE | Admit: 2020-03-11 | Discharge: 2020-03-11 | Disposition: A | Discharge status: Home / Self Care | Payer: 59 | Source: Ambulatory Visit | Attending: Critical Care Medicine | Admitting: Critical Care Medicine

## 2020-03-11 DIAGNOSIS — Z20822 Contact with and (suspected) exposure to covid-19: Secondary | ICD-10-CM | POA: Diagnosis not present

## 2020-03-11 DIAGNOSIS — Z01812 Encounter for preprocedural laboratory examination: Secondary | ICD-10-CM | POA: Insufficient documentation

## 2020-03-11 LAB — SARS CORONAVIRUS 2 (TAT 6-24 HRS): SARS Coronavirus 2: NEGATIVE

## 2020-03-14 ENCOUNTER — Other Ambulatory Visit: Payer: Self-pay

## 2020-03-14 ENCOUNTER — Ambulatory Visit (INDEPENDENT_AMBULATORY_CARE_PROVIDER_SITE_OTHER): Payer: 59 | Admitting: Critical Care Medicine

## 2020-03-14 DIAGNOSIS — R053 Chronic cough: Secondary | ICD-10-CM

## 2020-03-14 DIAGNOSIS — R05 Cough: Secondary | ICD-10-CM | POA: Diagnosis not present

## 2020-03-14 DIAGNOSIS — R079 Chest pain, unspecified: Secondary | ICD-10-CM

## 2020-03-14 DIAGNOSIS — R0602 Shortness of breath: Secondary | ICD-10-CM

## 2020-03-14 LAB — PULMONARY FUNCTION TEST
DL/VA % pred: 122 %
DL/VA: 5.04 ml/min/mmHg/L
DLCO cor % pred: 108 %
DLCO cor: 21.2 ml/min/mmHg
DLCO unc % pred: 108 %
DLCO unc: 21.2 ml/min/mmHg
FEF 25-75 Post: 2.74 L/sec
FEF 25-75 Pre: 2.47 L/sec
FEF2575-%Change-Post: 10 %
FEF2575-%Pred-Post: 150 %
FEF2575-%Pred-Pre: 135 %
FEV1-%Change-Post: 1 %
FEV1-%Pred-Post: 110 %
FEV1-%Pred-Pre: 108 %
FEV1-Post: 2.46 L
FEV1-Pre: 2.43 L
FEV1FVC-%Change-Post: 3 %
FEV1FVC-%Pred-Pre: 107 %
FEV6-%Change-Post: -1 %
FEV6-%Pred-Post: 104 %
FEV6-%Pred-Pre: 106 %
FEV6-Post: 2.94 L
FEV6-Pre: 2.99 L
FEV6FVC-%Pred-Post: 104 %
FEV6FVC-%Pred-Pre: 104 %
FVC-%Change-Post: -1 %
FVC-%Pred-Post: 99 %
FVC-%Pred-Pre: 101 %
FVC-Post: 2.94 L
FVC-Pre: 2.99 L
Post FEV1/FVC ratio: 84 %
Post FEV6/FVC ratio: 100 %
Pre FEV1/FVC ratio: 81 %
Pre FEV6/FVC Ratio: 100 %
RV % pred: 95 %
RV: 2.14 L
TLC % pred: 98 %
TLC: 5.03 L

## 2020-03-14 NOTE — Progress Notes (Signed)
PFT done today. 

## 2020-03-18 ENCOUNTER — Ambulatory Visit (HOSPITAL_COMMUNITY): Payer: 59 | Attending: Cardiovascular Disease

## 2020-03-18 ENCOUNTER — Other Ambulatory Visit: Payer: Self-pay

## 2020-03-18 ENCOUNTER — Ambulatory Visit (INDEPENDENT_AMBULATORY_CARE_PROVIDER_SITE_OTHER)
Admission: RE | Admit: 2020-03-18 | Discharge: 2020-03-18 | Disposition: A | Discharge status: Home / Self Care | Payer: 59 | Source: Ambulatory Visit | Attending: Critical Care Medicine | Admitting: Critical Care Medicine

## 2020-03-18 DIAGNOSIS — R0602 Shortness of breath: Secondary | ICD-10-CM | POA: Insufficient documentation

## 2020-03-18 DIAGNOSIS — R079 Chest pain, unspecified: Secondary | ICD-10-CM | POA: Diagnosis present

## 2020-03-21 NOTE — Progress Notes (Signed)
Please let Leslie Reed know that her echocardiogram was normal and her chest CT did not show any lung scarring. It did show bronchiectasis, which we will further discuss when she comes back to clinic. Thanks!

## 2020-04-01 ENCOUNTER — Ambulatory Visit: Payer: 59 | Admitting: Critical Care Medicine

## 2020-04-01 ENCOUNTER — Telehealth: Payer: Self-pay | Admitting: Critical Care Medicine

## 2020-04-01 ENCOUNTER — Other Ambulatory Visit: Payer: Self-pay

## 2020-04-01 ENCOUNTER — Encounter: Payer: Self-pay | Admitting: Critical Care Medicine

## 2020-04-01 ENCOUNTER — Other Ambulatory Visit (INDEPENDENT_AMBULATORY_CARE_PROVIDER_SITE_OTHER): Payer: 59

## 2020-04-01 VITALS — BP 106/58 | HR 79 | Temp 97.9°F | Ht 64.0 in | Wt 136.4 lb

## 2020-04-01 DIAGNOSIS — J479 Bronchiectasis, uncomplicated: Secondary | ICD-10-CM | POA: Diagnosis not present

## 2020-04-01 DIAGNOSIS — R05 Cough: Secondary | ICD-10-CM

## 2020-04-01 DIAGNOSIS — R0602 Shortness of breath: Secondary | ICD-10-CM

## 2020-04-01 DIAGNOSIS — J398 Other specified diseases of upper respiratory tract: Secondary | ICD-10-CM

## 2020-04-01 DIAGNOSIS — J988 Other specified respiratory disorders: Secondary | ICD-10-CM

## 2020-04-01 DIAGNOSIS — R053 Chronic cough: Secondary | ICD-10-CM

## 2020-04-01 LAB — CBC WITH DIFFERENTIAL/PLATELET
Basophils Absolute: 0 K/uL (ref 0.0–0.1)
Basophils Relative: 0.6 % (ref 0.0–3.0)
Eosinophils Absolute: 0 K/uL (ref 0.0–0.7)
Eosinophils Relative: 0.9 % (ref 0.0–5.0)
HCT: 38.7 % (ref 36.0–46.0)
Hemoglobin: 13.3 g/dL (ref 12.0–15.0)
Lymphocytes Relative: 28.1 % (ref 12.0–46.0)
Lymphs Abs: 1.3 K/uL (ref 0.7–4.0)
MCHC: 34.5 g/dL (ref 30.0–36.0)
MCV: 90.8 fl (ref 78.0–100.0)
Monocytes Absolute: 0.5 K/uL (ref 0.1–1.0)
Monocytes Relative: 10.5 % (ref 3.0–12.0)
Neutro Abs: 2.7 K/uL (ref 1.4–7.7)
Neutrophils Relative %: 59.9 % (ref 43.0–77.0)
Platelets: 266 K/uL (ref 150.0–400.0)
RBC: 4.26 Mil/uL (ref 3.87–5.11)
RDW: 13.8 % (ref 11.5–15.5)
WBC: 4.5 K/uL (ref 4.0–10.5)

## 2020-04-01 MED ORDER — FLUTTER DEVI: 1.0000 | Freq: Two times a day (BID) | 0 refills | Status: AC

## 2020-04-01 NOTE — H&P (View-Only) (Signed)
Synopsis: Referred in April 2021 for chronic cough by Panosh, Standley Brooking, MD.  Subjective:   PATIENT ID: Leslie Reed GENDER: female DOB: Jun 11, 1946, MRN: [id number]  Chief Complaint  Patient presents with  . Follow-up    chronic cough    Leslie Reed is a 74 year old woman who presents for follow-up of chronic cough and dyspnea on exertion.  She has family history of ILD in her mother, but has no previous history of lung disease.  No change in her symptoms since her last visit.  She has chronic sinus issues, but has not been requiring antibiotics recently.  Her symptoms are always worse when there is a change in humidity, but are generally well controlled with Vicks VapoRub.  No fever, chills, sweats, weight loss, fatigue.  She presents today to follow-up multiple studies been completed since her last visit.    OV 03/06/20: Leslie Reed is a 74 year old woman who presents for evaluation of dyspnea on exertion for the past 2 years and chronic cough.  She has had a chronic cough due to postnasal drip for many years.  She has a long history of sinus headaches and sinus congestion for which she uses Vicks VapoRub (on lips, chest, under nose) which seems to resolve her symptoms.  She has tried over-the-counter medications which have not helped as well.  When she was younger she had allergy testing, revealing that she was allergic to multiple foods and environmental allergens.  She coughs up phlegm first thing when waking up, but otherwise has a dry cough throughout the day.  She thinks CPAP and weather changes, especially humid weather make her sinus symptoms worse.  She has tried albuterol in the past without relief of her symptoms.  Her shortness of breath has been slowly progressive over the last 2 years.  She is no longer able to do things like walk up 2 flights of stairs as easily she previously could.  She is very active.  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  She recently was resealing break was exposed to lots of direct dust.  She never wears a respirator.  She has been exposed to asbestos in the past.  She has never smoked or vape.  Family history is notable for IPF in her mother who died in her 55s.  Both parents were heavy smokers.  Her shortness of breath is associated with mild central chest pressure and sometimes lightheadedness.  No nausea or sweating.  No previous history of coronary artery disease.  Her father had cardiac disease.  She had a normal treadmill stress test about 15 years ago with her primary care provider. Reviewed PCP note from 12/18/19. UTD covid, flu, pneumonia vaccines.   Past Medical History:  Diagnosis Date  . Allergic rhinitis   . Allergy   . Arthritis    hands, lower back  . Bronchitis    uses inhaler prn  . Cataract    just being monitored - no surgery  . Colitis 8/04  . Diverticulitis   . GERD (gastroesophageal reflux disease)    diet controlled, no meds  . History of migraine headaches   . Hx of colonic polyp   . Hx of varicella   . Hyperlipidemia   . Insomnia   . 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 Mitral valve prolapse   . Osteopenia   . Scoliosis 2005  . Sleep apnea   . Tick bite 2014   STARI-antibiotics  x 5 weeks     Family History  Problem Relation Age of Onset  . Diabetes Mother   . Pulmonary fibrosis Mother        88 deceased  . Hypertension Mother   . Hypertension Father   . Alzheimer's disease Father   . Kidney failure Father   . Diabetes Maternal Grandmother   . Cancer Maternal Grandfather        ??  . Hypertension Brother   . Diabetes Sister   . Diabetes Son   . Allergy (severe) Son   . Depression Son   . Thyroid disease Daughter   . Psoriasis Daughter   . Thyroid disease Daughter   . Colon cancer Neg Hx   . Esophageal cancer Neg Hx   . Rectal cancer Neg Hx   . Stomach cancer Neg Hx      Past Surgical History:  Procedure Laterality  Date  . ABDOMINAL HYSTERECTOMY     partial  for pain no cancer    . APPENDECTOMY    . BREAST BIOPSY Right    x 2   . CESAREAN SECTION     x4  . CHOLECYSTECTOMY    . colon polyps    . COLONOSCOPY    . DILATION AND CURETTAGE OF UTERUS     x 2  . WISDOM TOOTH EXTRACTION      Social History   Socioeconomic History  . Marital status: Married    Spouse name: Not on file  . Number of children: Not on file  . Years of education: Not on file  . Highest education level: Not on file  Occupational History  . Not on file  Tobacco Use  . Smoking status: Passive Smoke Exposure - Never Smoker  . Smokeless tobacco: Never Used  Substance and Sexual Activity  . Alcohol use: Yes    Comment: occ  . Drug use: No  . Sexual activity: Yes    Partners: Male    Birth control/protection: Surgical, Post-menopausal    Comment: TAH  Other Topics Concern  . 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 Difficulty of Paying Living Expenses:   Food Insecurity:   . Worried About Charity fundraiser in the Last Year:   . Arboriculturist in the Last Year:   Transportation Needs:   . Film/video editor (Medical):   Marland Kitchen Lack of Transportation (Non-Medical):   Physical Activity:   . Days of Exercise per Week:   . Minutes of Exercise per Session:   Stress:   . Feeling of Stress :   Social Connections:   . Frequency of Communication with Friends and Family:   . Frequency of Social Gatherings with Friends and Family:   . Attends Religious Services:   . Active Member of Clubs or Organizations:   . Attends Archivist Meetings:   Marland Kitchen Marital Status:   Intimate Partner Violence:   . Fear of Current or  Ex-Partner:   . Emotionally Abused:   Marland Kitchen Physically Abused:   . Sexually Abused:  Allergies  Allergen Reactions  . Pramoxine Hcl     REACTION: irritation on rectal area     Immunization History  Administered Date(s) Administered  . Influenza Split 08/24/2013  . Influenza, High Dose Seasonal PF 09/12/2017, 08/16/2018, 08/20/2019  . Moderna SARS-COVID-2 Vaccination 01/21/2020, 01/31/2020  . Pneumococcal Conjugate-13 12/18/2019  . Pneumococcal Polysaccharide-23 12/07/2016  . Tdap 04/18/2013    Outpatient Medications Prior to Visit  Medication Sig Dispense Refill  . rosuvastatin (CRESTOR) 10 MG tablet TAKE 1 TABLET BY MOUTH DAILY (Patient taking differently: Take 10 mg by mouth. taking 3 days per week) 90 tablet 1  . Vitamin D, Ergocalciferol, (DRISDOL) 1.25 MG (50000 UT) CAPS capsule TAKE ONE CAPSULE BY MOUTH EVERY 2 WEEKS. 12 capsule 3  . triamcinolone (KENALOG) 0.025 % cream Apply 1 application topically 2 (two) times daily.     No facility-administered medications prior to visit.    Review of Systems  Constitutional: Negative for chills and fever.  HENT: Positive for congestion and sinus pain.   Respiratory: Positive for cough, sputum production and shortness of breath. Negative for wheezing.   Cardiovascular: Positive for chest pain. Negative for leg swelling.  Gastrointestinal: Negative for abdominal pain, heartburn, nausea and vomiting.  Musculoskeletal: Negative for myalgias.       Hand arthritis- worse after activity  Neurological: Negative for weakness.  Endo/Heme/Allergies: Positive for environmental allergies.     Objective:   Vitals:   04/01/20 0906  BP: (!) 106/58  Pulse: 79  Temp: 97.9 F (36.6 C)  TempSrc: Temporal  SpO2: 98%  Weight: 136 lb 6.4 oz (61.9 kg)  Height: _0  (1.626 m)   98% on   RA BMI Readings from Last 3 Encounters:  04/01/20 23.41 kg/m  03/06/20 23.28 kg/m  12/18/19 23.52 kg/m   Wt Readings from Last 3 Encounters:    04/01/20 136 lb 6.4 oz (61.9 kg)  03/06/20 135 lb 9.6 oz (61.5 kg)  12/18/19 137 lb (62.1 kg)    Physical Exam Vitals reviewed.  Constitutional:      General: She is not in acute distress.    Appearance: Normal appearance. She is not ill-appearing.  HENT:     Head: Normocephalic and atraumatic.  Eyes:     General: No scleral icterus. Cardiovascular:     Rate and Rhythm: Normal rate and regular rhythm.     Heart sounds: No murmur.  Pulmonary:     Comments: Pain comfortably in room air, scattered wheezes posteriorly, otherwise CTA.  No significant coughing. Abdominal:     General: There is no distension.     Palpations: Abdomen is soft.  Musculoskeletal:        General: No swelling or deformity.     Cervical back: Neck supple.  Lymphadenopathy:     Cervical: No cervical adenopathy.  Skin:    General: Skin is warm and dry.     Findings: No rash.  Neurological:     General: No focal deficit present.     Mental Status: She is alert.     Coordination: Coordination normal.  Psychiatric:        Mood and Affect: Mood normal.        Behavior: Behavior normal.      CBC    Component Value Date/Time   WBC 6.8 12/18/2019 1140   RBC 4.62 12/18/2019 1140   HGB 14.2 12/18/2019 1140   HGB 12.5 07/01/2015 1354   HCT 42.6 12/18/2019 1140   PLT 296.0 12/18/2019 1140  MCV 92.4 12/18/2019 1140   MCH 30.5 05/28/2014 1311   MCHC 33.4 12/18/2019 1140   RDW 13.7 12/18/2019 1140   LYMPHSABS 1.3 12/18/2019 1140   MONOABS 0.5 12/18/2019 1140   EOSABS 0.0 12/18/2019 1140   BASOSABS 0.0 12/18/2019 1140    CHEMISTRY No results for input(s): NA, K, CL, CO2, GLUCOSE, BUN, CREATININE, CALCIUM, MG, PHOS in the last 168 hours. CrCl cannot be calculated (Patient's most recent lab result is older than the maximum 21 days allowed.).   Chest Imaging- films reviewed: CXR, 2 view 12/21/2016-hyperinflation, increased interstitial markings bilaterally, airway thickening versus bronchiectasis  left upper lobe.  HRCT chest 03/18/2020- No architectural distortion or fibrosis.  Mild biapical scarring.  Central bronchiectasis.  Few peripheral tree-in-bud nodules.  Areas of peripheral atelectasis in RML and lingula.  Nodularity on anterior and lateral walls of the trachea, noncalcified.  No significant mediastinal or hilar adenopathy.  Air trapping on expiratory films.  Pulmonary Functions Testing Results: PFT Results Latest Ref Rng & Units 03/14/2020 07/07/2016  FVC-Pre L 2.99 3.02  FVC-Predicted Pre % 101 99  FVC-Post L 2.94 3.08  FVC-Predicted Post % 99 100  Pre FEV1/FVC % % 81 84  Post FEV1/FCV % % 84 85  FEV1-Pre L 2.43 2.54  FEV1-Predicted Pre % 108 109  FEV1-Post L 2.46 2.60  DLCO UNC% % 108 90  DLCO COR %Predicted % 122 98  TLC L 5.03 5.20  TLC % Predicted % 98 102  RV % Predicted % 95 100  2021-no significant obstruction or bronchodilator reversibility.  No restriction.  Normal diffusion.  2017- no significant obstruction or bronchodilator reversibility.  No restriction.  Normal diffusion.  Echocardiogram 03/18/2020- LVEF 60 to 65%.  Normal LA, RV, RA.  Thickening with mild AR, normal valves.     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 Chronic cough  R05   3. 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 CT scan with tracheal nodules- possibly TPO given the distribution, can occasionally cause these symptoms. No sign of ILD. -Reviewed CT scan showing bronchiectasis and tracheal nodules -Taking immunoglobulins, CBC with differential to evaluate bronchiectasis -Planning for bronchoscopy for direct visualization of trachea, BAL for cultures.  Discussed risks, benefits, alternatives.  -Flutter valve twice daily   RTC in 4 weeks.   Current Outpatient Medications:  .  rosuvastatin (CRESTOR) 10 MG tablet, TAKE 1 TABLET BY MOUTH DAILY (Patient taking differently: Take 10 mg by  mouth. taking 3 days per week), Disp: 90 tablet, Rfl: 1 .  Vitamin D, Ergocalciferol, (DRISDOL) 1.25 MG (50000 UT) CAPS capsule, TAKE ONE CAPSULE BY MOUTH EVERY 2 WEEKS., Disp: 12 capsule, Rfl: 3     Julian Hy, DO [facility] 04/01/2020 9:11 AM

## 2020-04-01 NOTE — Progress Notes (Signed)
Synopsis: Referred in April 2021 for chronic cough by Panosh, Standley Brooking, MD.  Subjective:   PATIENT ID: Leslie Reed GENDER: female DOB: Apr 09, 1946, MRN: [id number]  Chief Complaint  Patient presents with  . Follow-up    chronic cough    Leslie Reed is a 74 year old woman who presents for follow-up of chronic cough and dyspnea on exertion.  She has family history of ILD in her mother, but has no previous history of lung disease.  No change in her symptoms since her last visit.  She has chronic sinus issues, but has not been requiring antibiotics recently.  Her symptoms are always worse when there is a change in humidity, but are generally well controlled with Vicks VapoRub.  No fever, chills, sweats, weight loss, fatigue.  She presents today to follow-up multiple studies been completed since her last visit.    OV 03/06/20: Leslie Reed is a 74 year old woman who presents for evaluation of dyspnea on exertion for the past 2 years and chronic cough.  She has had a chronic cough due to postnasal drip for many years.  She has a long history of sinus headaches and sinus congestion for which she uses Vicks VapoRub (on lips, chest, under nose) which seems to resolve her symptoms.  She has tried over-the-counter medications which have not helped as well.  When she was younger she had allergy testing, revealing that she was allergic to multiple foods and environmental allergens.  She coughs up phlegm first thing when waking up, but otherwise has a dry cough throughout the day.  She thinks CPAP and weather changes, especially humid weather make her sinus symptoms worse.  She has tried albuterol in the past without relief of her symptoms.  Her shortness of breath has been slowly progressive over the last 2 years.  She is no longer able to do things like walk up 2 flights of stairs as easily she previously could.  She is very active.  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  She recently was resealing break was exposed to lots of direct dust.  She never wears a respirator.  She has been exposed to asbestos in the past.  She has never smoked or vape.  Family history is notable for IPF in her mother who died in her 17s.  Both parents were heavy smokers.  Her shortness of breath is associated with mild central chest pressure and sometimes lightheadedness.  No nausea or sweating.  No previous history of coronary artery disease.  Her father had cardiac disease.  She had a normal treadmill stress test about 15 years ago with her primary care provider. Reviewed PCP note from 12/18/19. UTD covid, flu, pneumonia vaccines.   Past Medical History:  Diagnosis Date  . Allergic rhinitis   . Allergy   . Arthritis    hands, lower back  . Bronchitis    uses inhaler prn  . Cataract    just being monitored - no surgery  . Colitis 8/04  . Diverticulitis   . GERD (gastroesophageal reflux disease)    diet controlled, no meds  . History of migraine headaches   . Hx of colonic polyp   . Hx of varicella   . Hyperlipidemia   . Insomnia   . 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 Mitral valve prolapse   . Osteopenia   . Scoliosis 2005  . Sleep apnea   . Tick bite 2014   STARI-antibiotics  x 5 weeks     Family History  Problem Relation Age of Onset  . Diabetes Mother   . Pulmonary fibrosis Mother        25 deceased  . Hypertension Mother   . Hypertension Father   . Alzheimer's disease Father   . Kidney failure Father   . Diabetes Maternal Grandmother   . Cancer Maternal Grandfather        ??  . Hypertension Brother   . Diabetes Sister   . Diabetes Son   . Allergy (severe) Son   . Depression Son   . Thyroid disease Daughter   . Psoriasis Daughter   . Thyroid disease Daughter   . Colon cancer Neg Hx   . Esophageal cancer Neg Hx   . Rectal cancer Neg Hx   . Stomach cancer Neg Hx      Past Surgical History:  Procedure Laterality  Date  . ABDOMINAL HYSTERECTOMY     partial  for pain no cancer    . APPENDECTOMY    . BREAST BIOPSY Right    x 2   . CESAREAN SECTION     x4  . CHOLECYSTECTOMY    . colon polyps    . COLONOSCOPY    . DILATION AND CURETTAGE OF UTERUS     x 2  . WISDOM TOOTH EXTRACTION      Social History   Socioeconomic History  . Marital status: Married    Spouse name: Not on file  . Number of children: Not on file  . Years of education: Not on file  . Highest education level: Not on file  Occupational History  . Not on file  Tobacco Use  . Smoking status: Passive Smoke Exposure - Never Smoker  . Smokeless tobacco: Never Used  Substance and Sexual Activity  . Alcohol use: Yes    Comment: occ  . Drug use: No  . Sexual activity: Yes    Partners: Male    Birth control/protection: Surgical, Post-menopausal    Comment: TAH  Other Topics Concern  . 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 Difficulty of Paying Living Expenses:   Food Insecurity:   . Worried About Charity fundraiser in the Last Year:   . Arboriculturist in the Last Year:   Transportation Needs:   . Film/video editor (Medical):   Marland Kitchen Lack of Transportation (Non-Medical):   Physical Activity:   . Days of Exercise per Week:   . Minutes of Exercise per Session:   Stress:   . Feeling of Stress :   Social Connections:   . Frequency of Communication with Friends and Family:   . Frequency of Social Gatherings with Friends and Family:   . Attends Religious Services:   . Active Member of Clubs or Organizations:   . Attends Archivist Meetings:   Marland Kitchen Marital Status:   Intimate Partner Violence:   . Fear of Current or  Ex-Partner:   . Emotionally Abused:   Marland Kitchen Physically Abused:   . Sexually Abused:  Allergies  Allergen Reactions  . Pramoxine Hcl     REACTION: irritation on rectal area     Immunization History  Administered Date(s) Administered  . Influenza Split 08/24/2013  . Influenza, High Dose Seasonal PF 09/12/2017, 08/16/2018, 08/20/2019  . Moderna SARS-COVID-2 Vaccination 01/21/2020, 01/31/2020  . Pneumococcal Conjugate-13 12/18/2019  . Pneumococcal Polysaccharide-23 12/07/2016  . Tdap 04/18/2013    Outpatient Medications Prior to Visit  Medication Sig Dispense Refill  . rosuvastatin (CRESTOR) 10 MG tablet TAKE 1 TABLET BY MOUTH DAILY (Patient taking differently: Take 10 mg by mouth. taking 3 days per week) 90 tablet 1  . Vitamin D, Ergocalciferol, (DRISDOL) 1.25 MG (50000 UT) CAPS capsule TAKE ONE CAPSULE BY MOUTH EVERY 2 WEEKS. 12 capsule 3  . triamcinolone (KENALOG) 0.025 % cream Apply 1 application topically 2 (two) times daily.     No facility-administered medications prior to visit.    Review of Systems  Constitutional: Negative for chills and fever.  HENT: Positive for congestion and sinus pain.   Respiratory: Positive for cough, sputum production and shortness of breath. Negative for wheezing.   Cardiovascular: Positive for chest pain. Negative for leg swelling.  Gastrointestinal: Negative for abdominal pain, heartburn, nausea and vomiting.  Musculoskeletal: Negative for myalgias.       Hand arthritis- worse after activity  Neurological: Negative for weakness.  Endo/Heme/Allergies: Positive for environmental allergies.     Objective:   Vitals:   04/01/20 0906  BP: (!) 106/58  Pulse: 79  Temp: 97.9 F (36.6 C)  TempSrc: Temporal  SpO2: 98%  Weight: 136 lb 6.4 oz (61.9 kg)  Height: _0  (1.626 m)   98% on   RA BMI Readings from Last 3 Encounters:  04/01/20 23.41 kg/m  03/06/20 23.28 kg/m  12/18/19 23.52 kg/m   Wt Readings from Last 3 Encounters:   04/01/20 136 lb 6.4 oz (61.9 kg)  03/06/20 135 lb 9.6 oz (61.5 kg)  12/18/19 137 lb (62.1 kg)    Physical Exam Vitals reviewed.  Constitutional:      General: She is not in acute distress.    Appearance: Normal appearance. She is not ill-appearing.  HENT:     Head: Normocephalic and atraumatic.  Eyes:     General: No scleral icterus. Cardiovascular:     Rate and Rhythm: Normal rate and regular rhythm.     Heart sounds: No murmur.  Pulmonary:     Comments: Pain comfortably in room air, scattered wheezes posteriorly, otherwise CTA.  No significant coughing. Abdominal:     General: There is no distension.     Palpations: Abdomen is soft.  Musculoskeletal:        General: No swelling or deformity.     Cervical back: Neck supple.  Lymphadenopathy:     Cervical: No cervical adenopathy.  Skin:    General: Skin is warm and dry.     Findings: No rash.  Neurological:     General: No focal deficit present.     Mental Status: She is alert.     Coordination: Coordination normal.  Psychiatric:        Mood and Affect: Mood normal.        Behavior: Behavior normal.      CBC    Component Value Date/Time   WBC 6.8 12/18/2019 1140   RBC 4.62 12/18/2019 1140   HGB 14.2 12/18/2019 1140   HGB 12.5 07/01/2015 1354   HCT 42.6 12/18/2019 1140   PLT 296.0 12/18/2019 1140  MCV 92.4 12/18/2019 1140   MCH 30.5 05/28/2014 1311   MCHC 33.4 12/18/2019 1140   RDW 13.7 12/18/2019 1140   LYMPHSABS 1.3 12/18/2019 1140   MONOABS 0.5 12/18/2019 1140   EOSABS 0.0 12/18/2019 1140   BASOSABS 0.0 12/18/2019 1140    CHEMISTRY No results for input(s): NA, K, CL, CO2, GLUCOSE, BUN, CREATININE, CALCIUM, MG, PHOS in the last 168 hours. CrCl cannot be calculated (Patient's most recent lab result is older than the maximum 21 days allowed.).   Chest Imaging- films reviewed: CXR, 2 view 12/21/2016-hyperinflation, increased interstitial markings bilaterally, airway thickening versus bronchiectasis  left upper lobe.  HRCT chest 03/18/2020- No architectural distortion or fibrosis.  Mild biapical scarring.  Central bronchiectasis.  Few peripheral tree-in-bud nodules.  Areas of peripheral atelectasis in RML and lingula.  Nodularity on anterior and lateral walls of the trachea, noncalcified.  No significant mediastinal or hilar adenopathy.  Air trapping on expiratory films.  Pulmonary Functions Testing Results: PFT Results Latest Ref Rng & Units 03/14/2020 07/07/2016  FVC-Pre L 2.99 3.02  FVC-Predicted Pre % 101 99  FVC-Post L 2.94 3.08  FVC-Predicted Post % 99 100  Pre FEV1/FVC % % 81 84  Post FEV1/FCV % % 84 85  FEV1-Pre L 2.43 2.54  FEV1-Predicted Pre % 108 109  FEV1-Post L 2.46 2.60  DLCO UNC% % 108 90  DLCO COR %Predicted % 122 98  TLC L 5.03 5.20  TLC % Predicted % 98 102  RV % Predicted % 95 100  2021-no significant obstruction or bronchodilator reversibility.  No restriction.  Normal diffusion.  2017- no significant obstruction or bronchodilator reversibility.  No restriction.  Normal diffusion.  Echocardiogram 03/18/2020- LVEF 60 to 65%.  Normal LA, RV, RA.  Thickening with mild AR, normal valves.     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 Chronic cough  R05   3. 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 CT scan with tracheal nodules- possibly TPO given the distribution, can occasionally cause these symptoms. No sign of ILD. -Reviewed CT scan showing bronchiectasis and tracheal nodules -Taking immunoglobulins, CBC with differential to evaluate bronchiectasis -Planning for bronchoscopy for direct visualization of trachea, BAL for cultures.  Discussed risks, benefits, alternatives.  -Flutter valve twice daily   RTC in 4 weeks.   Current Outpatient Medications:  .  rosuvastatin (CRESTOR) 10 MG tablet, TAKE 1 TABLET BY MOUTH DAILY (Patient taking differently: Take 10 mg by  mouth. taking 3 days per week), Disp: 90 tablet, Rfl: 1 .  Vitamin D, Ergocalciferol, (DRISDOL) 1.25 MG (50000 UT) CAPS capsule, TAKE ONE CAPSULE BY MOUTH EVERY 2 WEEKS., Disp: 12 capsule, Rfl: 3     Julian Hy, DO [facility] 04/01/2020 9:11 AM

## 2020-04-01 NOTE — Patient Instructions (Addendum)
Thank you for visiting Dr. Carlis Abbott at [facility]. We recommend the following: Orders Placed This Encounter  Procedures  . IgE  . IgG, IgA, IgM  . IgG 1, 2, 3, and 4  . CBC with Differential/Platelet   Orders Placed This Encounter  Procedures  . IgE    Standing Status:   Future    Standing Expiration Date:   04/01/2021  . IgG, IgA, IgM    Standing Status:   Future    Standing Expiration Date:   04/01/2021  . IgG 1, 2, 3, and 4    Standing Status:   Future    Standing Expiration Date:   04/01/2021  . CBC with Differential/Platelet    Standing Status:   Future    Standing Expiration Date:   04/01/2021    Use flutter valve twice daily (10 times each)   Meds ordered this encounter  Medications  . Respiratory Therapy Supplies (FLUTTER) DEVI    Sig: 1 each by Does not apply route 2 (two) times daily.    Dispense:  1 each    Refill:  0    Return in about 3 months (around 07/02/2020).    Please do your part to reduce the spread of COVID-19.

## 2020-04-01 NOTE — Telephone Encounter (Signed)
Please schedule the following:  Diagnosis: tracheal nodules Procedure: bronch with BAL  Anesthesia: no Do you need Fluro? no Priority: normal Date: 5/25 Alternate Date:    5/26-  5/28  Time: AM only at [facility]     / PM ok if at [facility] Location: [facility]  Does patient have OSA? yes    DM? No    Or Latex allergy? no Medication Restriction:  no Anticoagulate/Antiplatelet: no Pre-op Labs Ordered:  no Imaging request: none  Please coordinate Pre-op COVID Testing    Julian Hy, DO 04/01/20 10:52 AM [facility]

## 2020-04-02 ENCOUNTER — Telehealth: Payer: Self-pay | Admitting: Critical Care Medicine

## 2020-04-02 DIAGNOSIS — D804 Selective deficiency of immunoglobulin M [IgM]: Secondary | ICD-10-CM

## 2020-04-02 DIAGNOSIS — J479 Bronchiectasis, uncomplicated: Secondary | ICD-10-CM

## 2020-04-02 DIAGNOSIS — D803 Selective deficiency of immunoglobulin G [IgG] subclasses: Secondary | ICD-10-CM

## 2020-04-02 LAB — IGG, IGA, IGM
IgG (Immunoglobin G), Serum: 558 mg/dL — ABNORMAL LOW (ref 600–1540)
IgM, Serum: 27 mg/dL — ABNORMAL LOW (ref 50–300)
Immunoglobulin A: 74 mg/dL (ref 70–320)

## 2020-04-02 LAB — IGE: IgE (Immunoglobulin E), Serum: <2 kU/L (ref <=114)

## 2020-04-03 LAB — IGG 1, 2, 3, AND 4
IgG (Immunoglobin G), Serum: 519 mg/dL — ABNORMAL LOW (ref 586–1602)
IgG, Subclass 1: 260 mg/dL (ref 248–810)
IgG, Subclass 2: 203 mg/dL (ref 130–555)
IgG, Subclass 3: 44 mg/dL (ref 15–102)
IgG, Subclass 4: 4 mg/dL (ref 2–96)

## 2020-04-03 NOTE — Telephone Encounter (Signed)
Called and spoke with patient letting her know that we were referring her to an immunologist and that her previous phone call was routed to the procedure pool and that someone would contact her to set up bronch. She expressed understanding. Nothing further needed at this time

## 2020-04-03 NOTE — Telephone Encounter (Signed)
Pt called to schedule this, would like to be reached back at [phone] anytime today

## 2020-04-04 NOTE — Telephone Encounter (Signed)
Patient checking on the scheduling of the Bronch procedure. Patient phone number is [phone]. Can do anytime in May 2021, June 14, 28 or 29 2021.

## 2020-04-07 NOTE — Telephone Encounter (Signed)
Called and spoke with patient. Let them know their Bronch is scheduled for 04/22/20 at [facility] with Dr. Carlis Abbott at 1pm.  Patient was instructed to arrive at hospital at 1130. They were instructed to bring someone with them as they will not be able to drive home from procedure. Patient instructed not to have anything to eat or drink after midnight.   Patient's covid screening is scheduled at [facility] for 04/19/20 at 11am.  Patient voiced understanding, nothing further needed  Routing to [facility] as FYI

## 2020-04-07 NOTE — Telephone Encounter (Signed)
Perfect, thanks!  LPC 

## 2020-04-19 ENCOUNTER — Other Ambulatory Visit (HOSPITAL_COMMUNITY)
Admission: RE | Admit: 2020-04-19 | Discharge: 2020-04-19 | Disposition: A | Discharge status: Home / Self Care | Payer: 59 | Source: Ambulatory Visit | Attending: Critical Care Medicine | Admitting: Critical Care Medicine

## 2020-04-19 DIAGNOSIS — Z01812 Encounter for preprocedural laboratory examination: Secondary | ICD-10-CM | POA: Diagnosis present

## 2020-04-19 DIAGNOSIS — Z20822 Contact with and (suspected) exposure to covid-19: Secondary | ICD-10-CM | POA: Insufficient documentation

## 2020-04-19 LAB — SARS CORONAVIRUS 2 (TAT 6-24 HRS): SARS Coronavirus 2: NEGATIVE

## 2020-04-22 ENCOUNTER — Ambulatory Visit (HOSPITAL_COMMUNITY)
Admission: RE | Admit: 2020-04-22 | Discharge: 2020-04-22 | Disposition: A | Discharge status: Home / Self Care | Payer: 59 | Attending: Critical Care Medicine | Admitting: Critical Care Medicine

## 2020-04-22 ENCOUNTER — Encounter (HOSPITAL_COMMUNITY): Payer: Self-pay | Admitting: Critical Care Medicine

## 2020-04-22 ENCOUNTER — Other Ambulatory Visit: Payer: Self-pay

## 2020-04-22 ENCOUNTER — Encounter (HOSPITAL_COMMUNITY)
Admission: RE | Disposition: A | Discharge status: Home / Self Care | Payer: Self-pay | Source: Home / Self Care | Attending: Critical Care Medicine

## 2020-04-22 DIAGNOSIS — M479 Spondylosis, unspecified: Secondary | ICD-10-CM | POA: Insufficient documentation

## 2020-04-22 DIAGNOSIS — Z79899 Other long term (current) drug therapy: Secondary | ICD-10-CM | POA: Insufficient documentation

## 2020-04-22 DIAGNOSIS — Z7722 Contact with and (suspected) exposure to environmental tobacco smoke (acute) (chronic): Secondary | ICD-10-CM | POA: Diagnosis not present

## 2020-04-22 DIAGNOSIS — M8580 Other specified disorders of bone density and structure, unspecified site: Secondary | ICD-10-CM | POA: Insufficient documentation

## 2020-04-22 DIAGNOSIS — E785 Hyperlipidemia, unspecified: Secondary | ICD-10-CM | POA: Diagnosis not present

## 2020-04-22 DIAGNOSIS — G4730 Sleep apnea, unspecified: Secondary | ICD-10-CM | POA: Insufficient documentation

## 2020-04-22 DIAGNOSIS — Z884 Allergy status to anesthetic agent status: Secondary | ICD-10-CM | POA: Insufficient documentation

## 2020-04-22 DIAGNOSIS — M19041 Primary osteoarthritis, right hand: Secondary | ICD-10-CM | POA: Insufficient documentation

## 2020-04-22 DIAGNOSIS — R9389 Abnormal findings on diagnostic imaging of other specified body structures: Secondary | ICD-10-CM

## 2020-04-22 DIAGNOSIS — J398 Other specified diseases of upper respiratory tract: Secondary | ICD-10-CM | POA: Insufficient documentation

## 2020-04-22 DIAGNOSIS — R05 Cough: Secondary | ICD-10-CM | POA: Insufficient documentation

## 2020-04-22 DIAGNOSIS — J988 Other specified respiratory disorders: Secondary | ICD-10-CM

## 2020-04-22 DIAGNOSIS — M19042 Primary osteoarthritis, left hand: Secondary | ICD-10-CM | POA: Diagnosis not present

## 2020-04-22 HISTORY — PX: BRONCHIAL WASHINGS: SHX5105

## 2020-04-22 HISTORY — PX: VIDEO BRONCHOSCOPY: SHX5072

## 2020-04-22 SURGERY — VIDEO BRONCHOSCOPY WITHOUT FLUORO
Anesthesia: Moderate Sedation

## 2020-04-22 MED ORDER — LACTATED RINGERS IV SOLN: INTRAVENOUS | Status: DC

## 2020-04-22 MED ORDER — MIDAZOLAM HCL (PF) 5 MG/ML IJ SOLN
INTRAMUSCULAR | Status: AC
  Filled 2020-04-22: qty 2

## 2020-04-22 MED ORDER — MIDAZOLAM HCL (PF) 10 MG/2ML IJ SOLN: INTRAMUSCULAR | Status: DC | PRN

## 2020-04-22 MED ORDER — FENTANYL CITRATE (PF) 100 MCG/2ML IJ SOLN
INTRAMUSCULAR | Status: DC | PRN
  Administered 2020-04-22: 50 ug via INTRAVENOUS
  Administered 2020-04-22 (×2): 25 ug via INTRAVENOUS

## 2020-04-22 MED ORDER — LIDOCAINE HCL 1 % IJ SOLN
INTRAMUSCULAR | Status: AC
  Filled 2020-04-22: qty 1

## 2020-04-22 MED ORDER — BUTAMBEN-TETRACAINE-BENZOCAINE 2-2-14 % EX AERO
INHALATION_SPRAY | CUTANEOUS | Status: DC | PRN
  Administered 2020-04-22: 2 via TOPICAL

## 2020-04-22 MED ORDER — DIPHENHYDRAMINE HCL 50 MG/ML IJ SOLN
INTRAMUSCULAR | Status: AC
  Filled 2020-04-22: qty 1

## 2020-04-22 MED ORDER — MIDAZOLAM HCL 5 MG/5ML IJ SOLN
INTRAMUSCULAR | Status: DC | PRN
  Administered 2020-04-22: 2 mg via INTRAVENOUS
  Administered 2020-04-22: 1 mg via INTRAVENOUS

## 2020-04-22 MED ORDER — PHENYLEPHRINE HCL 0.25 % NA SOLN: 1.0000 | Freq: Four times a day (QID) | NASAL | Status: DC | PRN

## 2020-04-22 MED ORDER — PHENYLEPHRINE HCL 0.5 % NA SOLN: NASAL | Status: DC | PRN

## 2020-04-22 MED ORDER — LIDOCAINE HCL 1 % IJ SOLN
INTRAMUSCULAR | Status: DC | PRN
  Administered 2020-04-22: 100 mg

## 2020-04-22 MED ORDER — PHENYLEPHRINE HCL 0.25 % NA SOLN
NASAL | Status: AC
  Filled 2020-04-22: qty 15

## 2020-04-22 MED ORDER — FENTANYL CITRATE (PF) 100 MCG/2ML IJ SOLN
INTRAMUSCULAR | Status: AC
  Filled 2020-04-22: qty 4

## 2020-04-22 MED ORDER — BUTAMBEN-TETRACAINE-BENZOCAINE 2-2-14 % EX AERO: 1.0000 | INHALATION_SPRAY | Freq: Once | CUTANEOUS | Status: DC

## 2020-04-22 MED ORDER — PHENYLEPHRINE HCL 0.25 % NA SOLN
NASAL | Status: DC | PRN
  Administered 2020-04-22: 2 via NASAL

## 2020-04-22 NOTE — Op Note (Signed)
Video Bronchoscopy Procedure Note  Date of Operation: 04/22/2020  Pre-op Diagnosis: tracheal nodules, chronic cough  Post-op Diagnosis: Same  Surgeon: Julian Hy  Assistants: none  Anesthesia: moderate sedation  Meds Given: fentanyl 19mg, versed 352min divided doses,  1% lidocaine 10cc topcailly total  Operation: Flexible video fiberoptic bronchoscopy and BAL.  Estimated Blood Loss: 0 cc  Complications: none noted  Indications and History: Leslie ASSEFAs [age].o. with history of chronic cough, abnormal chest CT.  Recommendation was to perform video fiberoptic bronchoscopy with BAL. The risks, benefits, complications, treatment options and expected outcomes were discussed with the patient.  The possibilities of pneumothorax, pneumonia, reaction to medication, pulmonary aspiration, perforation of a viscus, bleeding, failure to diagnose a condition and creating a complication requiring transfusion or operation were discussed with the patient who freely signed the consent.    Description of Procedure: The patient was seen in the Preoperative Area, was examined and was deemed appropriate to proceed.  The patient was taken to endoscopy, identified as ShKENORA SPAYDith DOB [birth year]and MRN [id number] the procedure verified as Flexible Video Fiberoptic Bronchoscopy.  A Time Out was held and the above information confirmed.   Conscious sedation was initiated as indicated above. The video fiberoptic bronchoscope was introduced via the mouth and a general inspection was performed which showed normal cords, multiple fleshy tracheal nodules, normal main carina. The R sided airways were inspected and showed normal RUL, BI, RML and RLL. The L side was then inspected. The LLL, Lingular and LUL airways were normal.   BAL was performed of lateral RML to be sent for cultures. 100cc saline instilled with 40cc returned. The patient tolerated the procedure well. The bronchoscope was removed.  There were no obvious complications.   Samples: 1.  BAL from lateral RLL  Plans:  We will review the microbiology results with the patient when they become available.  Outpatient followup will be with Dr ClCarlis Abbott   LaJulian HyDO 04/22/20 1:45 PM [facility]

## 2020-04-22 NOTE — Discharge Instructions (Addendum)
Flexible Bronchoscopy, Care After This sheet gives you information about how to care for yourself after your procedure. Your health care provider may also give you more specific instructions. If you have problems or questions, contact your health care provider. What can I expect after the procedure? After the procedure, it is common to have the following symptoms for 24-48 hours:  A cough that is worse than it was before the procedure.  A low-grade fever.  A sore throat or hoarse voice.  Small streaks of blood in the mucus from your lungs (sputum), if tissue samples were removed (biopsy). Follow these instructions at home: Eating and drinking  Do not eat or drink anything (including water) for 1 hours after your procedure, or until your numbing medicine (local anesthetic) has worn off. Having a numb throat increases your risk of burning yourself or choking.  After your numbness is gone and your cough and gag reflexes have returned, you may start eating only soft foods and slowly drinking liquids.  The day after the procedure, return to your normal diet. Driving  Do not drive for 24 hours if you were given a medicine to help you relax (sedative).  Do not drive or use heavy machinery while taking prescription pain medicine. General instructions   Take over-the-counter and prescription medicines only as told by your health care provider.  Return to your normal activities as told by your health care provider. Ask your health care provider what activities are safe for you.  Do not use any products that contain nicotine or tobacco, such as cigarettes and e-cigarettes. If you need help quitting, ask your health care provider.  Keep all follow-up visits as told by your health care provider. This is important, especially if you had a biopsy taken. Get help right away if:  You have shortness of breath that gets worse.  You become light-headed or feel like you might faint.  You have  chest pain.  You cough up more than a small amount of blood.  The amount of blood you cough up increases. Summary  Common symptoms in the 24-48 hours following a flexible bronchoscopy include cough, low-grade fever, sore throat or hoarse voice, and blood-streaked mucus from the lungs (if you had a biopsy).  Do not eat or drink anything (including water) for 2 hours after your procedure, or until your local anesthetic has worn off. You can return to your normal diet the day after the procedure.  Get help right away if you develop worsening shortness of breath, have chest pain, become light-headed, or cough up more than a small amount of blood. This information is not intended to replace advice given to you by your health care provider. Make sure you discuss any questions you have with your health care provider. Document Revised: 10/28/2017 Document Reviewed: 12/03/2016 Elsevier Patient Education  2020 [employer].

## 2020-04-22 NOTE — Plan of Care (Signed)
ASA 1 Mallampati 3  Julian Hy, DO 04/22/20 1:16 PM [facility]

## 2020-04-22 NOTE — Interval H&P Note (Signed)
History and Physical Interval Note:  04/22/2020 1:10 PM  Leslie Reed  has presented today for surgery, with the diagnosis of TRACHEAL NODULES.  The various methods of treatment have been discussed with the patient and family. After consideration of risks, benefits and other options for treatment, the patient has consented to  Procedure(s): VIDEO BRONCHOSCOPY WITHOUT FLUORO (N/A) as a surgical intervention.  The patient's history has been reviewed, patient examined, no change in status, stable for surgery.  I have reviewed the patient's chart and labs.  Questions were answered to the patient's satisfaction.    NPO today. No AC or AP meds. Only new med since she was last seen is rogaine. She has a ride home with her daughter. Risks, benefits, alternatives disucssed. Consent signed in chart. Has a previous history of rash to topical pramoxine, but has had lidocaine at the dentist without difficulty/ side effects.  Julian Hy, DO 04/22/20 1:11 PM [name]

## 2020-04-22 NOTE — Discharge Summary (Signed)
Physician Discharge Summary   Patient ID: Leslie Reed [id number] 74 y.o. 09/20/1946  Admit date: 04/22/2020  Discharge date and time: No discharge date for patient encounter.   Admitting Physician: Julian Hy, DO   Discharge Physician: same  Admission Diagnoses: TRACHEAL NODULES, abnormal CT  Discharge Diagnoses: same  Admission Condition: good  Discharged Condition: good  Indication for Admission: bronchoscopy  Hospital Course: Admitted for bronchoscopy, performed uneventfully. Tolerated procedure well and able to be discharged home with her daughter.     Disposition: home  Patient Instructions:  Allergies as of 04/22/2020      Reactions   Pramoxine Hcl    REACTION: irritation on rectal area      Medication List    TAKE these medications   Flutter Devi 1 each by Does not apply route 2 (two) times daily.   minoxidil 2 % external solution Commonly known as: ROGAINE Apply topically 2 (two) times daily at 10 am and 4 pm.   pseudoephedrine-acetaminophen 30-500 MG Tabs tablet Commonly known as: TYLENOL SINUS Take 1 tablet by mouth every 4 (four) hours as needed (allergies).   rosuvastatin 10 MG tablet Commonly known as: CRESTOR TAKE 1 TABLET BY MOUTH DAILY What changed:   when to take this  additional instructions   Vitamin D (Ergocalciferol) 1.25 MG (50000 UNIT) Caps capsule Commonly known as: DRISDOL TAKE ONE CAPSULE BY MOUTH EVERY 2 WEEKS. What changed: See the new instructions.      Activity: no driving until tomorrow Diet: regular diet Wound Care: none needed  Follow-up with Dr. Carlis Abbott in 2 month.  Signed: Julian Hy 04/22/2020 1:53 PM

## 2020-04-23 ENCOUNTER — Encounter: Payer: Self-pay | Admitting: *Deleted

## 2020-04-24 LAB — ACID FAST SMEAR (AFB, MYCOBACTERIA): Acid Fast Smear: NEGATIVE

## 2020-04-25 LAB — CULTURE, RESPIRATORY W GRAM STAIN: Culture: NORMAL

## 2020-04-28 ENCOUNTER — Encounter: Payer: Self-pay | Admitting: Adult Health

## 2020-04-30 ENCOUNTER — Ambulatory Visit: Payer: 59 | Admitting: Adult Health

## 2020-04-30 ENCOUNTER — Encounter: Payer: Self-pay | Admitting: Adult Health

## 2020-04-30 ENCOUNTER — Other Ambulatory Visit: Payer: Self-pay

## 2020-04-30 VITALS — BP 128/74 | HR 66 | Ht 64.0 in | Wt 139.0 lb

## 2020-04-30 DIAGNOSIS — Z9989 Dependence on other enabling machines and devices: Secondary | ICD-10-CM | POA: Diagnosis not present

## 2020-04-30 DIAGNOSIS — G4733 Obstructive sleep apnea (adult) (pediatric): Secondary | ICD-10-CM | POA: Diagnosis not present

## 2020-04-30 NOTE — Patient Instructions (Signed)
Continue using CPAP nightly and greater than 4 hours each night If your symptoms worsen or you develop new symptoms please let us know.   Please call [facility] at [phone], and press option 1 when prompted. Their customer service representatives will be glad to assist you. If they are unable to answer, please leave a message and they will call you back. Make sure to leave your name and return phone number.

## 2020-04-30 NOTE — Progress Notes (Signed)
PATIENT: Leslie Reed DOB: [birth year]  REASON FOR VISIT: follow up HISTORY FROM: patient  HISTORY OF PRESENT ILLNESS: Today 04/30/20:  Leslie Reed is a 74 year old female with a history of obstructive sleep apnea on CPAP.  Her download indicates that she use her machine 27 out of 30 days for compliance of 90%.  She is averaging greater than 4 hours each night.  On average she uses her machine 4 hours and 58 minutes.  Her residual AHI is 2.3 on 5 to 15 cm of water with EPR 3.  Leak in the 95th percentile is 26.8 L/min.  Reports that the CPAP works fairly well for her.  She does note that she is not changed her supplies out since she received them.  She does have a so clean machine but has never used it.  HISTORY (Copied from Leslie Reed's note) Interval history from 07 December 2018 I had the pleasure of seeing Leslie Reed today who had since I have seen her last undergone a home sleep test by watch Leslie Reed on 07 December 2017 exactly a year ago home sleep test was based on an elevated fatigue severity score at 38 points and resulted in an AHI of 7.4, RDI was 8.6, there was no prolonged oxygenation time, heart rate was 59 bpm with a tendency to be bradycardic.  This was followed by a an in lab test was ordered for his REM behavior disorder and was refused by her insurance.  She started on an auto titration CPAP between 5 and 13 cmH2O following the home sleep test results there is 3 cm expiratory pressure relief set for her as well she has achieved and 97% compliance and has been compliant in the last 2 visits with Leslie Rubin, NP.  Average use of time is 5 hours 3 minutes result residual AHI is 2.3 which is excellent the residual apneas consist mostly of obstructive apneas telling me that she could handle a little bit higher pressure well.  Epworth sleepiness score was reduced to 8, fatigue severity was still high at 42 and the geriatric depression score today was 4 out of 15 points.  We also addressed  her mild cognitive impairment history which I think is partially due to overwhelming concern in regards to her son's failing health.  REVIEW OF SYSTEMS: Out of a complete 14 system review of symptoms, the patient complains only of the following symptoms, and all other reviewed systems are negative.  ESS 3  ALLERGIES: Allergies  Allergen Reactions  . Pramoxine Hcl     REACTION: irritation on rectal area    HOME MEDICATIONS: Outpatient Medications Prior to Visit  Medication Sig Dispense Refill  . minoxidil (ROGAINE) 2 % external solution Apply topically 2 (two) times daily at 10 am and 4 pm.    . pseudoephedrine-acetaminophen (TYLENOL SINUS) 30-500 MG TABS tablet Take 1 tablet by mouth every 4 (four) hours as needed (allergies).    Marland Kitchen Respiratory Therapy Supplies (FLUTTER) DEVI 1 each by Does not apply route 2 (two) times daily. 1 each 0  . rosuvastatin (CRESTOR) 10 MG tablet TAKE 1 TABLET BY MOUTH DAILY (Patient taking differently: Take 10 mg by mouth See admin instructions. taking 3 days per week) 90 tablet 1  . Vitamin D, Ergocalciferol, (DRISDOL) 1.25 MG (50000 UT) CAPS capsule TAKE ONE CAPSULE BY MOUTH EVERY 2 WEEKS. (Patient taking differently: Take 50,000 Units by mouth every 14 (fourteen) days. TAKE ONE CAPSULE BY MOUTH EVERY 2 WEEKS.) 12 capsule 3  No facility-administered medications prior to visit.    PAST MEDICAL HISTORY: Past Medical History:  Diagnosis Date  . Allergic rhinitis   . Allergy   . Arthritis    hands, lower back  . Bronchitis    uses inhaler prn  . Cataract    just being monitored - no surgery  . Colitis 8/04  . Diverticulitis   . GERD (gastroesophageal reflux disease)    diet controlled, no meds  . History of migraine headaches   . Hx of colonic polyp   . Hx of varicella   . Hyperlipidemia   . Insomnia   . 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 Mitral valve prolapse   . Osteopenia   . Scoliosis 2005  . Sleep apnea   . Tick bite 2014    STARI-antibiotics x 5 weeks    PAST SURGICAL HISTORY: Past Surgical History:  Procedure Laterality Date  . ABDOMINAL HYSTERECTOMY     partial  for pain no cancer    . APPENDECTOMY    . BREAST BIOPSY Right    x 2   . BRONCHIAL WASHINGS  04/22/2020   Procedure: BRONCHIAL lavage;  Surgeon: Leslie Hy, DO;  Location: [facility];  Service: Endoscopy;;  . CESAREAN SECTION     x4  . CHOLECYSTECTOMY    . colon polyps    . COLONOSCOPY    . DILATION AND CURETTAGE OF UTERUS     x 2  . VIDEO BRONCHOSCOPY N/A 04/22/2020   Procedure: VIDEO BRONCHOSCOPY WITHOUT FLUORO;  Surgeon: Leslie Hy, DO;  Location: [facility];  Service: Endoscopy;  Laterality: N/A;  . WISDOM TOOTH EXTRACTION      FAMILY HISTORY: Family History  Problem Relation Age of Onset  . Diabetes Mother   . Pulmonary fibrosis Mother        27 deceased  . Hypertension Mother   . Hypertension Father   . Alzheimer's disease Father   . Kidney failure Father   . Diabetes Maternal Grandmother   . Cancer Maternal Grandfather        ??  . Hypertension Brother   . Diabetes Sister   . Diabetes Son   . Allergy (severe) Son   . Depression Son   . Thyroid disease Daughter   . Psoriasis Daughter   . Thyroid disease Daughter   . Colon cancer Neg Hx   . Esophageal cancer Neg Hx   . Rectal cancer Neg Hx   . Stomach cancer Neg Hx     SOCIAL HISTORY: Social History   Socioeconomic History  . Marital status: Married    Spouse name: Not on file  . Number of children: Not on file  . Years of education: Not on file  . Highest education level: Not on file  Occupational History  . Not on file  Tobacco Use  . Smoking status: Passive Smoke Exposure - Never Smoker  . Smokeless tobacco: Never Used  Substance and Sexual Activity  . Alcohol use: Yes    Comment: occ  . Drug use: No  . Sexual activity: Yes    Partners: Male    Birth control/protection: Surgical, Post-menopausal    Comment: TAH  Other Topics Concern   . 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 Difficulty of Paying Living Expenses:   Food Insecurity:   . Worried About Charity fundraiser in the Last Year:   . Arboriculturist in the Last Year:   Transportation Needs:   . Film/video editor (Medical):   Marland Kitchen Lack of Transportation (Non-Medical):   Physical Activity:   . Days of Exercise per Week:   . Minutes of Exercise per Session:   Stress:   . Feeling of Stress :   Social Connections:   . Frequency of Communication with Friends and Family:   . Frequency of Social Gatherings with Friends and Family:   . Attends Religious Services:   . Active Member of Clubs or Organizations:   . Attends Archivist Meetings:   Marland Kitchen Marital Status:   Intimate Partner Violence:   . Fear of Current or Ex-Partner:   . Emotionally Abused:   Marland Kitchen Physically Abused:   . Sexually Abused:       PHYSICAL EXAM  Vitals:   04/30/20 0843  Height: 5\' 4"  (1.626 m)   Body mass index is 23.17 kg/m.  Generalized: Well developed, in no acute distress  Chest: Lungs clear to auscultation bilaterally  Neurological examination  Mentation: Alert oriented to time, place, history taking. Follows all commands speech and language fluent Cranial nerve II-XII: Extraocular movements were full, visual field were full on confrontational test Head turning and shoulder shrug  were normal and symmetric. Motor: The motor testing reveals 5 over 5 strength of all 4 extremities. Good symmetric motor tone is noted throughout.  Sensory: Sensory testing is intact to soft touch on all 4 extremities. No evidence of extinction is noted.  Gait and station: Gait is normal.     DIAGNOSTIC DATA (LABS, IMAGING, TESTING) - I reviewed patient records, labs, notes, testing and imaging myself where available.  Lab Results  Component Value Date   WBC 4.5 04/01/2020   HGB 13.3 04/01/2020   HCT 38.7 04/01/2020   MCV 90.8 04/01/2020   PLT 266.0 04/01/2020      Component Value Date/Time   NA 141 12/18/2019 1140   K 4.1 12/18/2019 1140   CL 105 12/18/2019 1140   CO2 25 12/18/2019 1140   GLUCOSE 86 12/18/2019 1140   BUN 18 12/18/2019 1140   CREATININE 0.66 12/18/2019 1140   CREATININE 0.66 07/01/2015 1415   CALCIUM 9.3 12/18/2019 1140   PROT 6.7 12/18/2019 1140   ALBUMIN 4.7 12/18/2019 1140   AST 15 12/18/2019 1140   ALT 17 12/18/2019 1140   ALKPHOS 53 12/18/2019 1140   BILITOT 0.5 12/18/2019 1140   GFRNONAA 89.85 02/24/2010 1118   Lab Results  Component Value Date   CHOL 166 12/18/2019   HDL 55.40 12/18/2019   LDLCALC 89 12/18/2019   LDLDIRECT 138.0 10/13/2011   TRIG 107.0 12/18/2019   CHOLHDL 3 12/18/2019   No results found for: HGBA1C Lab Results  Component Value Date   VITAMINB12 384 10/08/2016   Lab Results  Component Value Date   TSH 0.96 12/18/2019      ASSESSMENT AND PLAN 74 y.o. year old female  has a past medical history of Allergic rhinitis, Allergy, Arthritis, Bronchitis, Cataract, Colitis (8/04), Diverticulitis, GERD (gastroesophageal reflux disease), History of migraine headaches, colonic polyp, varicella, Hyperlipidemia, Insomnia, Interstitial cystitis (10/90), Mitral valve prolapse, Osteopenia, Scoliosis (2005), Sleep apnea,  and Tick bite (2014). here with:  1. OSA on CPAP  - CPAP compliance excellent - Good treatment of AHI  - Encourage patient to use CPAP nightly and > 4 hours each night - F/U in 1 year or sooner if needed   I spent 20 minutes of face-to-face and non-face-to-face time with patient.  This included previsit chart review, lab review, study review, order entry, electronic health record documentation,  patient education.  Ward Givens, MSN, NP-C 04/30/2020, 8:54 AM [facility] [address], [place], [place] [address] [phone]

## 2020-05-05 ENCOUNTER — Other Ambulatory Visit: Payer: Self-pay

## 2020-05-05 DIAGNOSIS — Z9989 Dependence on other enabling machines and devices: Secondary | ICD-10-CM

## 2020-05-05 DIAGNOSIS — G4733 Obstructive sleep apnea (adult) (pediatric): Secondary | ICD-10-CM

## 2020-05-05 NOTE — Progress Notes (Signed)
Order for cpap supplies sent to [facility] via [facility] msg. Confirmation received that the order transmitted was successful. 

## 2020-05-19 ENCOUNTER — Other Ambulatory Visit: Payer: Self-pay

## 2020-05-19 ENCOUNTER — Ambulatory Visit: Payer: 59 | Admitting: Allergy

## 2020-05-19 ENCOUNTER — Encounter: Payer: Self-pay | Admitting: Allergy

## 2020-05-19 VITALS — BP 110/70 | HR 83 | Temp 98.1°F | Resp 16 | Ht 63.5 in | Wt 140.0 lb

## 2020-05-19 DIAGNOSIS — R768 Other specified abnormal immunological findings in serum: Secondary | ICD-10-CM

## 2020-05-19 DIAGNOSIS — J310 Chronic rhinitis: Secondary | ICD-10-CM | POA: Diagnosis not present

## 2020-05-19 DIAGNOSIS — Z8709 Personal history of other diseases of the respiratory system: Secondary | ICD-10-CM

## 2020-05-19 DIAGNOSIS — J479 Bronchiectasis, uncomplicated: Secondary | ICD-10-CM

## 2020-05-19 MED ORDER — AZELASTINE-FLUTICASONE 137-50 MCG/ACT NA SUSP
1.0000 | Freq: Two times a day (BID) | NASAL | 5 refills | Status: DC
Start: 2020-05-19 — End: 2020-06-17

## 2020-05-19 NOTE — Progress Notes (Signed)
New Patient Note  RE: Leslie Reed MRN: [id number] DOB: [birth year] Date of Office Visit: 05/19/2020  Referring provider: Julian Hy, DO Primary care provider: Burnis Medin, MD  Chief Complaint: Recurrent Pneumonia  History of Present Illness: I had the pleasure of seeing Leslie Reed for initial evaluation at the [facility] on 05/19/2020. She is a 74 y.o. female, who is referred here by Dr. 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 Up to date with COVID-19 vaccine: yes  Patient has been worked up with pulmonology initially for dyspnea on exertion and mother passed away from idiopathic pulmonary fibrosis. Patient had bronchoscopy, CT chest - tracheal nodule and mild central bronchiectasis; and bloodwork that showed abnormal immunoglobulins.   Patient has history multiple infections including sinus infection, pneumonia, questionably ear infections. Patient has not history of opportunistic infections including fungal infections, viral infections.   Patient labs show immunoglobulin levels 03/30/2020 IgG 558, IgM 27, IgA 74. Patient reports 1 antibiotic use in the last 12 months and 0 hospital admissions. Patient does not have any secondary causes of immunodeficiency including chronic steroid use, diabetes mellitus, protein losing enteropathy, renal or hepatic dysfunction, history of cancer or irradiation or history of HIV, hepatitis B or C.  Patient is up to date with covid-19 vaccine, flu, pneumovax and Prevnar.  Patient uses Vick's vapor rub under nose at night to help with the sinus congestion which has been helping. She wakes up in the mornings with coughs up phlegm for 1 hour and then she is good for the rest of the day. She was exposed to significant second hand tobacco smoke as a younger child.  She was also tested for environmental and food allergens and was told she had multiple allergens in the past and wanted patient  to start injections which she never started.  She also has a son who has been having some type of infection issues.    Component     Latest Ref Rng & Units 04/01/2020  Immunoglobulin A     70 - 320 mg/dL 74  IgG (Immunoglobin G), Serum     600 - 1,540 mg/dL 558 (L)  IgM, Serum     50 - 300 mg/dL 27 (L)    Component     Latest Ref Rng & Units 04/01/2020 04/01/2020         9:54 AM  9:54 AM  IgG (Immunoglobin G), Serum     600 - 1,540 mg/dL 558 (L) 519 (L)  IgG, Subclass 1     248 - 810 mg/dL  260  IgG, Subclass 2     130 - 555 mg/dL  203  IgG, Subclass 3     15 - 102 mg/dL  44  IgG, Subclass 4     2 - 96 mg/dL  4   Assessment and Plan: Leslie Reed is a 74 y.o. 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 History of frequent upper respiratory infections, mild central bronchiectasis, mother who passed away from IPF. Up to date with COVID-19 vaccine, flu, pneumovax and prevnar. 1 course of antibiotics the last 12 months. . Get bloodwork to look at immune system.   Keep track of infections.   Discussed that depending on bloodwork she may be a candidate for IgG replacements given her bronchiectasis.   History of frequent upper respiratory infection  See assessment and plan as above.  Chronic rhinitis Perennial  rhinitis symptoms and using Vick's vapor rub. Today's skin testing was negative to environmental allergies.  Start dymista (fluticasone + azelastine nasal spray combination) 1 spray per nostril twice a day for symptom control. If it's not covered let us know.   Nasal saline spray (i.e., Simply Saline) or nasal saline lavage (i.e., NeilMed) is recommended as needed and prior to medicated nasal sprays.  Return in about 4 weeks (around 06/16/2020).  Follow up with pulmonology as scheduled.  Meds ordered this encounter  Medications  . Azelastine-Fluticasone 137-50 MCG/ACT SUSP    Sig: Place 1 spray into the nose in the morning and at  bedtime.    Dispense:  23 g    Refill:  5    Lab Orders     Complement, total     Strep pneumoniae 23 Serotypes IgG     Alpha-1-antitrypsin     Diphtheria / Tetanus Antibody Panel     IgE  Other allergy screening: Rhino conjunctivitis: no Food allergy: no Medication allergy: yes  Pramoxine  Hymenoptera allergy: no Urticaria: no Eczema:no  Diagnostics: Skin Testing: Environmental allergy panel. Negative test to: environmental allergy panel and common foods. Results discussed with patient/family.  Airborne Adult Perc - 05/19/20 1040    Time Antigen Placed 1040    Allergen Manufacturer Lavella Hammock    Location Back    Number of Test 59    Panel 1 Select    1. Control-Buffer 50% Glycerol Negative    2. Control-Histamine 1 mg/ml 2+    3. Albumin saline Negative    4. Adin Negative    5. [place] Negative    6. Johnson Negative    7. Douglas Blue Negative    8. Meadow Fescue Negative    9. Perennial Rye Negative    10. Sweet Vernal Negative    11. Timothy Negative    12. Cocklebur Negative    13. Burweed Marshelder Negative    14. Ragweed, short Negative    15. Ragweed, Giant Negative    16. Plantain,  English Negative    17. Lamb's Quarters Negative    18. Sheep Sorrell Negative    19. Rough Pigweed Negative    20. Marsh Elder, Rough Negative    21. Mugwort, Common Negative    22. Ash mix Negative    23. Birch mix Negative    24. Beech American Negative    25. Box, Elder Negative    26. Cedar, red Negative    27. Cottonwood, [place] Negative    28. Elm mix Negative    29. Hickory Negative    30. Maple mix Negative    31. Oak, [place] mix Negative    32. Pecan Pollen Negative    33. Pine mix Negative    34. Sycamore Eastern Negative    35. Atlantic, Black Pollen Negative    36. Alternaria alternata Negative    37. Cladosporium Herbarum Negative    38. Aspergillus mix Negative    39. Penicillium mix Negative    40. Bipolaris sorokiniana (Helminthosporium)  Negative    41. Drechslera spicifera (Curvularia) Negative    42. Mucor plumbeus Negative    43. Fusarium moniliforme Negative    44. Aureobasidium pullulans (pullulara) Negative    45. Rhizopus oryzae Negative    46. Botrytis cinera Negative    47. Epicoccum nigrum Negative    48. Phoma betae Negative    49. Candida Albicans Negative    50. Trichophyton mentagrophytes Negative    51.  Mite, D Farinae  5,000 AU/ml Negative    52. Mite, D Pteronyssinus  5,000 AU/ml Negative    53. Cat Hair 10,000 BAU/ml Negative    54.  Dog Epithelia Negative    55. Mixed Feathers Negative    56. Horse Epithelia Negative    57. Cockroach, German Negative    58. Mouse Negative    59. Tobacco Leaf Negative          Food Perc - 05/19/20 1040      Test Information   Time Antigen Placed 1040    Allergen Manufacturer Lavella Hammock    Location Back    Number of allergen test [place]   1. Peanut Negative    2. Soybean food Negative    3. Wheat, whole Negative    4. Sesame Negative    5. Milk, cow Negative    6. Egg White, chicken Negative    7. Casein Negative    8. Shellfish mix Negative    9. Fish mix Negative    10. Cashew Negative           Past Medical History: Patient Active Problem List   Diagnosis Date Noted  . Chronic rhinitis 05/19/2020  . History of frequent upper respiratory infection 05/19/2020  . 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 Bronchiectasis without complication ([place]) 17/51/0258  . Hyperlipidemia 12/15/2018  . Arthralgia 12/15/2018  . Obstructive sleep apnea treated with continuous positive airway pressure (CPAP) 03/15/2018  . Adjustment insomnia 11/17/2017  . Swelling of both ankles 05/03/2013  . LFTs abnormal 01/02/2012  . Right-sided chest wall pain 01/02/2012  . Visit for preventive health examination 12/28/2011  . Sleep disturbance 10/13/2011  . Memory difficulty 10/13/2011  . Migraine headache 10/13/2011  . Diverticulitis   . Low back pain  06/21/2011  . ASTHMA, INTERMITTENT 09/01/2010  . RECTAL BLEEDING 02/24/2010  . PANCOLITIS 03/12/2009  . COLONIC POLYPS, ADENOMATOUS, HX OF 03/12/2009  . 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 THORACOLUMBAR SCOLIOSIS, MILD 02/29/2008  . CHEST DISCOMFORT 02/29/2008  . PLANTAR FASCIITIS, LEFT 10/31/2007  . COLON POLYP 01/26/2007  . RHINITIS, ALLERGIC 01/26/2007  . REFLUX ESOPHAGITIS 01/26/2007  . DIVERTICULOSIS OF COLON 01/26/2007  . IRRITABLE BOWEL SYNDROME 01/26/2007  . OSTEOPENIA 01/26/2007   Past Medical History:  Diagnosis Date  . Allergic rhinitis   . Allergy   . Arthritis    hands, lower back  . Bronchitis    uses inhaler prn  . Cataract    just being monitored - no surgery  . Colitis 8/04  . Diverticulitis   . GERD (gastroesophageal reflux disease)    diet controlled, no meds  . History of migraine headaches   . Hx of colonic polyp   . Hx of varicella   . Hyperlipidemia   . Insomnia   . 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 Mitral valve prolapse   . Osteopenia   . Scoliosis 2005  . Sleep apnea   . Tick bite 2014   STARI-antibiotics x 5 weeks   Past Surgical History: Past Surgical History:  Procedure Laterality Date  . ABDOMINAL HYSTERECTOMY     partial  for pain no cancer    . APPENDECTOMY    . BREAST BIOPSY Right    x 2   . BRONCHIAL WASHINGS  04/22/2020   Procedure: BRONCHIAL lavage;  Surgeon: Julian Hy, DO;  Location: [facility];  Service: Endoscopy;;  . CESAREAN  SECTION     x4  . CHOLECYSTECTOMY    . colon polyps    . COLONOSCOPY    . DILATION AND CURETTAGE OF UTERUS     x 2  . VIDEO BRONCHOSCOPY N/A 04/22/2020   Procedure: VIDEO BRONCHOSCOPY WITHOUT FLUORO;  Surgeon: Julian Hy, DO;  Location: [facility];  Service: Endoscopy;  Laterality: N/A;  . WISDOM TOOTH EXTRACTION     Medication List:  Current Outpatient Medications  Medication Sig Dispense Refill  . Camphor-Eucalyptus-Menthol (VICKS VAPORUB EX) Apply topically daily.    Marland Kitchen  MINOXIDIL, TOPICAL, 5 % SOLN Apply topically daily.    . pseudoephedrine-acetaminophen (TYLENOL SINUS) 30-500 MG TABS tablet Take 1 tablet by mouth every 4 (four) hours as needed (allergies).    . rosuvastatin (CRESTOR) 10 MG tablet TAKE 1 TABLET BY MOUTH DAILY (Patient taking differently: Take 10 mg by mouth See admin instructions. taking 3 days per week) 90 tablet 1  . TURMERIC PO Take by mouth. Taking every other week    . Vitamin D, Ergocalciferol, (DRISDOL) 1.25 MG (50000 UT) CAPS capsule TAKE ONE CAPSULE BY MOUTH EVERY 2 WEEKS. (Patient taking differently: Take 50,000 Units by mouth every 14 (fourteen) days. TAKE ONE CAPSULE BY MOUTH EVERY 2 WEEKS.) 12 capsule 3  . Azelastine-Fluticasone 137-50 MCG/ACT SUSP Place 1 spray into the nose in the morning and at bedtime. 23 g 5  . Respiratory Therapy Supplies (FLUTTER) DEVI 1 each by Does not apply route 2 (two) times daily. 1 each 0   No current facility-administered medications for this visit.   Allergies: Allergies  Allergen Reactions  . Pramoxine Hcl     REACTION: irritation on rectal area   Social History: Social History   Socioeconomic History  . Marital status: Married    Spouse name: Not on file  . Number of children: Not on file  . Years of education: Not on file  . Highest education level: Not on file  Occupational History  . Not on file  Tobacco Use  . Smoking status: Passive Smoke Exposure - Never Smoker  . Smokeless tobacco: Never Used  Vaping Use  . Vaping Use: Never used  Substance and Sexual Activity  . Alcohol use: Yes    Comment: occ  . Drug use: No  . Sexual activity: Yes    Partners: Male    Birth control/protection: Surgical, Post-menopausal    Comment: TAH  Other Topics Concern  . 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 Difficulty of Paying Living Expenses:   Food Insecurity:   . Worried About Charity fundraiser in the Last Year:   . Arboriculturist in the Last Year:   Transportation Needs:   . Film/video editor (Medical):   Marland Kitchen Lack of Transportation (Non-Medical):   Physical Activity:   . Days of Exercise per Week:   . Minutes of Exercise per Session:   Stress:   . Feeling of Stress :   Social Connections:   . Frequency of Communication with Friends and Family:   . Frequency of Social  Gatherings with Friends and Family:   . Attends Religious Services:   . Active Member of Clubs or Organizations:   . Attends Archivist Meetings:   Marland Kitchen Marital Status:    Lives in a 73 year old home. Smoking: denies but exposed to second hand tobacco smoking Occupation: maintenance and cleaning  Environmental History: Water Damage/mildew in the house: no Carpet in the family room: no Carpet in the bedroom: no Heating: heat pump Cooling: central Pet: no  Family History: Family History  Problem Relation Age of Onset  . Diabetes Mother   . Pulmonary fibrosis Mother        20 deceased  . Hypertension Mother   . Hypertension Father   . Alzheimer's disease Father   . Kidney failure Father   . Diabetes Maternal Grandmother   . Cancer Maternal Grandfather        ??  . Hypertension Brother   . Diabetes Sister   . Diabetes Son   . Allergy (severe) Son   . Depression Son   . Thyroid disease Daughter   . Psoriasis Daughter   . Thyroid disease Daughter   . Colon cancer Neg Hx   . Esophageal cancer Neg Hx   . Rectal cancer Neg Hx   . Stomach cancer Neg Hx    Review of Systems  Constitutional: Negative for appetite change, chills, fever and unexpected weight change.  HENT: Positive for congestion, postnasal drip and sinus pressure. Negative for rhinorrhea.   Eyes: Negative for  itching.  Respiratory: Positive for shortness of breath. Negative for cough, chest tightness and wheezing.   Cardiovascular: Negative for chest pain.  Gastrointestinal: Negative for abdominal pain.  Genitourinary: Negative for difficulty urinating.  Skin: Negative for rash.  Allergic/Immunologic: Negative for environmental allergies and food allergies.  Neurological: Positive for headaches.   Objective: BP 110/70 (BP Location: Right Arm, Patient Position: Sitting, Cuff Size: Normal)   Pulse 83   Temp 98.1 F (36.7 C) (Temporal)   Resp 16   Ht 5' 3.5" (1.613 m)   Wt 140 lb (63.5 kg)   LMP 11/29/1986   SpO2 97%   BMI 24.41 kg/m  Body mass index is 24.41 kg/m. Physical Exam Vitals and nursing note reviewed.  Constitutional:      Appearance: Normal appearance. She is well-developed.  HENT:     Head: Normocephalic and atraumatic.     Right Ear: External ear normal.     Left Ear: External ear normal.     Nose: Nose normal.     Mouth/Throat:     Mouth: Mucous membranes are moist.     Pharynx: Oropharynx is clear.  Eyes:     Conjunctiva/sclera: Conjunctivae normal.  Cardiovascular:     Rate and Rhythm: Normal rate and regular rhythm.     Heart sounds: Normal heart sounds. No murmur heard.  No friction rub. No gallop.   Pulmonary:     Effort: Pulmonary effort is normal.     Breath sounds: Normal breath sounds. No wheezing, rhonchi or rales.  Abdominal:     Palpations: Abdomen is soft.  Musculoskeletal:     Cervical back: Neck supple.  Skin:    General: Skin is warm.     Findings: No rash.  Neurological:     Mental Status: She is alert and oriented to person, place, and time.  Psychiatric:        Behavior: Behavior normal.    The plan was reviewed with the patient/family, and  all questions/concerned were addressed.  It was my pleasure to see Leslie Reed today and participate in her care. Please feel free to contact me with any questions or concerns.  Sincerely,  Rexene Alberts, DO Allergy & Immunology  [facility] office: [phone] [facility] office: [place] office: [phone]

## 2020-05-19 NOTE — Assessment & Plan Note (Signed)
Patient referred by pulmonology for low IgG and IgM. History of frequent upper respiratory infections, mild central bronchiectasis, mother who passed away from IPF. Up to date with COVID-19 vaccine, flu, pneumovax and prevnar. 1 course of antibiotics the last 12 months. . Get bloodwork to look at immune system.   Keep track of infections.   Discussed that depending on bloodwork she may be a candidate for IgG replacements given her bronchiectasis.

## 2020-05-19 NOTE — Assessment & Plan Note (Signed)
.   See assessment and plan as above. 

## 2020-05-19 NOTE — Patient Instructions (Addendum)
Today's skin testing showed: Negative to indoor/outdoor allergens and common foods including mold.  . Get bloodwork:  o We are ordering labs, so please allow 1-2 weeks for the results to come back. o With the newly implemented Cures Act, the labs might be visible to you at the same time that they become visible to me. However, I will not address the results until all of the results are back, so please be patient.  o In the meantime, continue recommendations in your patient instructions, including avoidance measures (if applicable), until you hear from me.   Keep track of infections. Start dymista (fluticasone + azelastine nasal spray combination) 1 spray per nostril twice a day. If it's not covered let us know.   Nasal saline spray (i.e., Simply Saline) or nasal saline lavage (i.e., NeilMed) is recommended as needed and prior to medicated nasal sprays.  Follow up in 1 months or sooner if needed.  Follow up with pulmonology and their recommendations.

## 2020-05-19 NOTE — Assessment & Plan Note (Addendum)
Perennial rhinitis symptoms and using Vick's vapor rub. Today's skin testing was negative to environmental allergies.  Start dymista (fluticasone + azelastine nasal spray combination) 1 spray per nostril twice a day for symptom control. If it's not covered let us know.   Nasal saline spray (i.e., Simply Saline) or nasal saline lavage (i.e., NeilMed) is recommended as needed and prior to medicated nasal sprays.

## 2020-05-20 ENCOUNTER — Telehealth: Payer: Self-pay | Admitting: Allergy

## 2020-05-20 MED ORDER — AZELASTINE HCL 0.1 % NA SOLN
2.0000 | Freq: Two times a day (BID) | NASAL | 5 refills | Status: DC
Start: 2020-05-20 — End: 2021-05-01

## 2020-05-20 MED ORDER — FLUTICASONE PROPIONATE 50 MCG/ACT NA SUSP
2.0000 | Freq: Every day | NASAL | 5 refills | Status: DC
Start: 2020-05-20 — End: 2020-06-17

## 2020-05-20 NOTE — Telephone Encounter (Signed)
Called patient to inform her that we will send in the Azelastine and Fluticasone separated and per Dr. Maudie Mercury 1 spray each nostril twice a day as needed. Patient stated that she believes someone has already sent them in because her pharmacy called and stated that she had two prescriptions ready for pick up. I looked in patients chart to be sure they were sent in and it looks like [facility] sent them in. Patient verbalized understanding.

## 2020-05-20 NOTE — Addendum Note (Signed)
Addended by: Guy Franco on: 05/20/2020 01:56 PM   Modules accepted: Orders

## 2020-05-20 NOTE — Telephone Encounter (Signed)
Patient came into the office and states her insurance does not cover the combination of azelastine-fluticasone and needs them separated. Patient states the pharamacy called yesterday around lunch and was told the doctor was at lunch and our office never called back.  Please advise and inform patient.

## 2020-05-23 LAB — FUNGAL ORGANISM REFLEX

## 2020-05-23 LAB — FUNGUS CULTURE WITH STAIN

## 2020-05-23 LAB — FUNGUS CULTURE RESULT

## 2020-05-24 LAB — STREP PNEUMONIAE 23 SEROTYPES IGG
Pneumo Ab Type 1*: 4.5 ug/mL (ref >1.3)
Pneumo Ab Type 12 (12F)*: 0.3 ug/mL — ABNORMAL LOW (ref >1.3)
Pneumo Ab Type 14*: 13.2 ug/mL (ref >1.3)
Pneumo Ab Type 17 (17F)*: 0.7 ug/mL — ABNORMAL LOW (ref >1.3)
Pneumo Ab Type 19 (19F)*: 7.1 ug/mL (ref >1.3)
Pneumo Ab Type 2*: 2.5 ug/mL (ref >1.3)
Pneumo Ab Type 20*: 3.7 ug/mL (ref >1.3)
Pneumo Ab Type 22 (22F)*: 1.1 ug/mL — ABNORMAL LOW (ref >1.3)
Pneumo Ab Type 23 (23F)*: 3.9 ug/mL (ref >1.3)
Pneumo Ab Type 26 (6B)*: 16.4 ug/mL (ref >1.3)
Pneumo Ab Type 3*: 7.8 ug/mL (ref >1.3)
Pneumo Ab Type 34 (10A)*: 1.1 ug/mL — ABNORMAL LOW (ref >1.3)
Pneumo Ab Type 4*: 0.9 ug/mL — ABNORMAL LOW (ref >1.3)
Pneumo Ab Type 43 (11A)*: 1 ug/mL — ABNORMAL LOW (ref >1.3)
Pneumo Ab Type 5*: 14.7 ug/mL (ref >1.3)
Pneumo Ab Type 51 (7F)*: 6.6 ug/mL (ref >1.3)
Pneumo Ab Type 54 (15B)*: 1.4 ug/mL (ref >1.3)
Pneumo Ab Type 56 (18C)*: 4.5 ug/mL (ref >1.3)
Pneumo Ab Type 57 (19A)*: 6.5 ug/mL (ref >1.3)
Pneumo Ab Type 68 (9V)*: 1.4 ug/mL (ref >1.3)
Pneumo Ab Type 70 (33F)*: 1 ug/mL — ABNORMAL LOW (ref >1.3)
Pneumo Ab Type 8*: 0.9 ug/mL — ABNORMAL LOW (ref >1.3)
Pneumo Ab Type 9 (9N)*: 0.7 ug/mL — ABNORMAL LOW (ref >1.3)

## 2020-05-24 LAB — ALPHA-1-ANTITRYPSIN: A-1 Antitrypsin: 135 mg/dL (ref 101–187)

## 2020-05-24 LAB — DIPHTHERIA / TETANUS ANTIBODY PANEL
Diphtheria Ab: 1.25 IU/mL (ref <0.10)
Tetanus Ab, IgG: 1.63 IU/mL (ref <0.10)

## 2020-05-24 LAB — COMPLEMENT, TOTAL: Compl, Total (CH50): >60 U/mL (ref >41)

## 2020-05-24 LAB — IGE: IgE (Immunoglobulin E), Serum: 3 IU/mL — ABNORMAL LOW (ref 6–495)

## 2020-06-06 LAB — ACID FAST CULTURE WITH REFLEXED SENSITIVITIES (MYCOBACTERIA): Acid Fast Culture: NEGATIVE

## 2020-06-16 NOTE — Progress Notes (Signed)
74 y.o. [id number] Married White or Caucasian female here for annual exam.  Having some SOB when walking up stairs.  Had evaluation with Dr. Carlis Abbott, pulmonology.  Had CT, bronchoscopy, and pulmonary function.    Continues to use CPAP after seeing Dr. Brett Fairy.    Denies vaginal bleeding.    Son living with pt.  H/o lung disease so they have been very, very careful.  Pt feels this is the right thing to do for her son.  Does think it is emotionally hard on him to be so isolated from people.   Patient's last menstrual period was 11/29/1986.          Sexually active: No.  The current method of family planning is status post hysterectomy.    Exercising: No.  exercise but physical work Smoker:  no  Health Maintenance: Pap:  2009 neg History of abnormal Pap:  no MMG:  08-14-2019 category b density birads 1:neg Colonoscopy:  09-09-16 f/u 53yrs BMD:   08-05-17 osteopenia TDaP:  2014 Pneumonia vaccine(s):  2021 Shingrix:   Zoster done Hep C testing: 2017 neg Screening Labs: poct urine-neg   reports that she is a non-smoker but has been exposed to tobacco smoke. She has never used smokeless tobacco. She reports current alcohol use. She reports that she does not use drugs.  Past Medical History:  Diagnosis Date   Allergic rhinitis    Allergy    Arthritis    hands, lower back   Bronchitis    uses inhaler prn   Cataract    just being monitored - no surgery   Colitis 8/04   Diverticulitis    GERD (gastroesophageal reflux disease)    diet controlled, no meds   History of migraine headaches    Hx of colonic polyp    Hx of varicella    Hyperlipidemia    Insomnia    Interstitial cystitis 10/90   Mitral valve prolapse    Osteopenia    Scoliosis 2005   Sleep apnea    Tick bite 2014   STARI-antibiotics x 5 weeks   Tracheobronchopathia-osteochondroplastica     Past Surgical History:  Procedure Laterality Date   ABDOMINAL HYSTERECTOMY     partial  for pain no cancer      APPENDECTOMY     BREAST BIOPSY Right    x 2    BRONCHIAL WASHINGS  04/22/2020   Procedure: BRONCHIAL lavage;  Surgeon: Julian Hy, DO;  Location: [facility];  Service: Endoscopy;;   CESAREAN SECTION     x4   CHOLECYSTECTOMY     colon polyps     COLONOSCOPY     DILATION AND CURETTAGE OF UTERUS     x 2   VIDEO BRONCHOSCOPY N/A 04/22/2020   Procedure: VIDEO BRONCHOSCOPY WITHOUT FLUORO;  Surgeon: Julian Hy, DO;  Location: [facility];  Service: Endoscopy;  Laterality: N/A;   WISDOM TOOTH EXTRACTION      Current Outpatient Medications  Medication Sig Dispense Refill   azelastine (ASTELIN) 0.1 % nasal spray Place 2 sprays into both nostrils 2 (two) times daily. 30 mL 5   BIOTIN PO Take by mouth.     Minoxidil (ROGAINE EX) Apply topically.     Respiratory Therapy Supplies (FLUTTER) DEVI 1 each by Does not apply route 2 (two) times daily. 1 each 0   rosuvastatin (CRESTOR) 10 MG tablet TAKE 1 TABLET BY MOUTH DAILY (Patient taking differently: Take 10 mg by mouth See admin instructions. taking 3 days  per week) 90 tablet 1   SALINE NASAL SPRAY NA Place into the nose.     UNABLE TO FIND Fluticasone propionate nasal spray     Vitamin D, Ergocalciferol, (DRISDOL) 1.25 MG (50000 UT) CAPS capsule TAKE ONE CAPSULE BY MOUTH EVERY 2 WEEKS. (Patient taking differently: Take 50,000 Units by mouth every 14 (fourteen) days. TAKE ONE CAPSULE BY MOUTH EVERY 2 WEEKS.) 12 capsule 3   pseudoephedrine-acetaminophen (TYLENOL SINUS) 30-500 MG TABS tablet Take 1 tablet by mouth every 4 (four) hours as needed (allergies). (Patient not taking: Reported on 06/17/2020)     No current facility-administered medications for this visit.    Family History  Problem Relation Age of Onset   Diabetes Mother    Pulmonary fibrosis Mother        7 deceased   Hypertension Mother    Hypertension Father    Alzheimer's disease Father    Kidney failure Father    Diabetes Maternal  Grandmother    Cancer Maternal Grandfather        ??   Hypertension Brother    Diabetes Sister    Diabetes Son    Allergy (severe) Son    Depression Son    Thyroid disease Daughter    Psoriasis Daughter    Thyroid disease Daughter    Colon cancer Neg Hx    Esophageal cancer Neg Hx    Rectal cancer Neg Hx    Stomach cancer Neg Hx     Review of Systems  Constitutional: Negative.   HENT: Negative.   Eyes: Negative.   Respiratory: Negative.   Cardiovascular: Negative.   Gastrointestinal: Negative.   Endocrine: Negative.   Genitourinary: Negative.   Musculoskeletal: Negative.   Skin: Negative.   Allergic/Immunologic: Negative.   Neurological: Negative.   Hematological: Negative.   Psychiatric/Behavioral: Negative.     Exam:   BP 112/72    Pulse 68    Resp 16    Ht 5' 3.75" (1.619 m)    Wt 142 lb (64.4 kg)    LMP 11/29/1986    BMI 24.57 kg/m   Height: 5' 3.75" (161.9 cm)  General appearance: alert, cooperative and appears stated age Head: Normocephalic, without obvious abnormality, atraumatic Neck: no adenopathy, supple, symmetrical, trachea midline and thyroid normal to inspection and palpation Lungs: clear to auscultation bilaterally Breasts: normal appearance, no masses or tenderness Heart: regular rate and rhythm Abdomen: soft, non-tender; bowel sounds normal; no masses,  no organomegaly Extremities: extremities normal, atraumatic, no cyanosis or edema Skin: Skin color, texture, turgor normal. No rashes or lesions Lymph nodes: Cervical, supraclavicular, and axillary nodes normal. No abnormal inguinal nodes palpated Neurologic: Grossly normal   Pelvic: External genitalia:  no lesions              Urethra:  normal appearing urethra with no masses, tenderness or lesions              Bartholins and Skenes: normal                 Vagina: normal appearing vagina with normal color and discharge, no lesions              Cervix: absent              Pap  taken: No. Bimanual Exam:  Uterus:  uterus absent              Adnexa: no mass, fullness, tenderness  Rectovaginal: Confirms               Anus:  normal sphincter tone, no lesions  Chaperone, Terence Lux, CMA, was present for exam.  A:  Well Woman with normal exam PMP, no HRT Osteopenia H/o mitral valve prolapse H/o TAH due to chronic pain 2/88 Osteopenia H/o colon polyps Vit D deficiency Elevated lipids  P:   Mammogram guidelines reveiwed.  Doing yearly. pap smear not indicated due to TAH hx BMD order will be faxed to [facility] and request they call her to schedule Colonoscopy due 2023 Vit D obtained today.  On 50K ever 14 days.  #6/4RF.  She finds this easier to take than daily OTC supplement. Return annually or prn

## 2020-06-17 ENCOUNTER — Encounter: Payer: Self-pay | Admitting: Obstetrics & Gynecology

## 2020-06-17 ENCOUNTER — Ambulatory Visit: Payer: 59 | Admitting: Obstetrics & Gynecology

## 2020-06-17 ENCOUNTER — Other Ambulatory Visit: Payer: Self-pay

## 2020-06-17 VITALS — BP 112/72 | HR 68 | Resp 16 | Ht 63.75 in | Wt 142.0 lb

## 2020-06-17 DIAGNOSIS — E559 Vitamin D deficiency, unspecified: Secondary | ICD-10-CM

## 2020-06-17 DIAGNOSIS — Z01419 Encounter for gynecological examination (general) (routine) without abnormal findings: Secondary | ICD-10-CM | POA: Diagnosis not present

## 2020-06-17 DIAGNOSIS — Z0000 Encounter for general adult medical examination without abnormal findings: Secondary | ICD-10-CM | POA: Diagnosis not present

## 2020-06-17 LAB — POCT URINALYSIS DIPSTICK
Bilirubin, UA: NEGATIVE
Blood, UA: NEGATIVE
Glucose, UA: NEGATIVE
Ketones, UA: NEGATIVE
Leukocytes, UA: NEGATIVE
Nitrite, UA: NEGATIVE
Protein, UA: NEGATIVE
Urobilinogen, UA: NEGATIVE E.U./dL — AB
pH, UA: 5 (ref 5.0–8.0)

## 2020-06-17 MED ORDER — VITAMIN D (ERGOCALCIFEROL) 1.25 MG (50000 UNIT) PO CAPS: 50000.0000 [IU] | ORAL_CAPSULE | ORAL | 4 refills | Status: DC

## 2020-06-18 ENCOUNTER — Ambulatory Visit: Payer: 59 | Admitting: Allergy

## 2020-06-18 ENCOUNTER — Encounter: Payer: Self-pay | Admitting: Allergy

## 2020-06-18 VITALS — BP 126/82 | HR 78 | Temp 98.3°F | Resp 16

## 2020-06-18 DIAGNOSIS — J310 Chronic rhinitis: Secondary | ICD-10-CM | POA: Diagnosis not present

## 2020-06-18 DIAGNOSIS — Z8709 Personal history of other diseases of the respiratory system: Secondary | ICD-10-CM

## 2020-06-18 DIAGNOSIS — R768 Other specified abnormal immunological findings in serum: Secondary | ICD-10-CM

## 2020-06-18 LAB — VITAMIN D 25 HYDROXY (VIT D DEFICIENCY, FRACTURES): Vit D, 25-Hydroxy: 25.9 ng/mL — ABNORMAL LOW (ref 30.0–100.0)

## 2020-06-18 NOTE — Patient Instructions (Addendum)
   Keep track of infections. Continue fluticasone 1 spray per nostril twice a day for nasal congestion.  May use azelastine nasal spray 1-2 sprays per nostril twice a day as needed for runny nose/drainage.  May use nasal saline spray (i.e., Simply Saline) as needed.   You may benefit from IgG replacements via subcutaneously.    Read about immunodeficiency.  [email or web address].morrow.com/  Recommend ENT evaluation for the ears.  Follow up in 4 months or sooner if needed.   Follow up with pulmonology and their recommendations.

## 2020-06-18 NOTE — Assessment & Plan Note (Addendum)
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 History of frequent upper respiratory infections, mild central bronchiectasis, mother who passed away from IPF. Up to date with COVID-19 vaccine, flu, pneumovax and prevnar. 1 course of antibiotics the last 12 months. Interim history - 2021 bloodwork diptheria, tetanus and s. Pneumonia titers adequate. Alpha 1 level and total complement level normal. No infections/antibiotics since last visit but having issues with left ear and left sided teeth.  Keep track of infections.  Discussed the risks and benefits of IgG replacement therapy and patient is not ready to commit. I gave her a website where she can read more about immunodeficiency.   Recommendation would be to start subcutaneous IgG replacement given low IgG and bronchiectasis.  Recommend ENT evaluation for the ears.  Follow up with pulmonology as scheduled in August and their recommendations.

## 2020-06-18 NOTE — Assessment & Plan Note (Signed)
Past history - Perennial rhinitis symptoms and using Vick's vapor rub. 2021 skin testing was negative to environmental allergies.  Interim history - Much better with below regimen. Continue fluticasone 1 spray per nostril twice a day for nasal congestion.  May use azelastine nasal spray 1-2 sprays per nostril twice a day as needed for runny nose/drainage.  May use nasal saline spray (i.e., Simply Saline) as needed.

## 2020-06-18 NOTE — Progress Notes (Signed)
Follow Up Note  RE: Leslie Reed MRN: [id number] DOB: [birth year] Date of Office Visit: 06/18/2020  Referring provider: Burnis Medin, MD Primary care provider: Burnis Medin, MD  Chief Complaint: Follow-up  History of Present Illness: I had the pleasure of seeing Leslie Reed for a follow up visit at the [facility] on 06/18/2020. She is a 74 y.o. female, who is being followed for low IgG, history of frequent upper respiratory infection and chronic rhinitis. Her previous allergy office visit was on 05/19/2020 with Dr. Maudie Mercury. Today is a regular follow up visit.  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  Did not read about IgG replacement and is hesitant about starting at this time.  Having some issues with her left ear fullness/drainage. That's the area where she had issues with her root canals as well. She also has TMJ.  Chronic rhinitis Currently on fluticasone, azelastine 1 spray twice a day with good benefit.   Assessment and Plan: Leslie Reed is a 73 y.o. 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 History of frequent upper respiratory infections, mild central bronchiectasis, mother who passed away from IPF. Up to date with COVID-19 vaccine, flu, pneumovax and prevnar. 1 course of antibiotics the last 12 months. Interim history - 2021 bloodwork diptheria, tetanus and s. Pneumonia titers adequate. Alpha 1 level and total complement level normal. No infections/antibiotics since last visit but having issues with left ear and left sided teeth.  Keep track of infections.  Discussed the risks and benefits of IgG replacement therapy and patient is not ready to commit. I gave her a website where she can read more about immunodeficiency.   Recommendation would be to start subcutaneous IgG replacement given low IgG and bronchiectasis.  Recommend ENT evaluation for the  ears.  Follow up with pulmonology as scheduled in August and their recommendations.  Chronic rhinitis Past history - Perennial rhinitis symptoms and using Vick's vapor rub. 2021 skin testing was negative to environmental allergies.  Interim history - Much better with below regimen. Continue fluticasone 1 spray per nostril twice a day for nasal congestion.  May use azelastine nasal spray 1-2 sprays per nostril twice a day as needed for runny nose/drainage.  May use nasal saline spray (i.e., Simply Saline) as needed.  Return in about 4 months (around 10/19/2020).  Diagnostics: None.  Medication List:  Current Outpatient Medications  Medication Sig Dispense Refill  . azelastine (ASTELIN) 0.1 % nasal spray Place 2 sprays into both nostrils 2 (two) times daily. 30 mL 5  . BIOTIN PO Take by mouth.    . Minoxidil (ROGAINE EX) Apply topically.    Marland Kitchen Respiratory Therapy Supplies (FLUTTER) DEVI 1 each by Does not apply route 2 (two) times daily. 1 each 0  . rosuvastatin (CRESTOR) 10 MG tablet TAKE 1 TABLET BY MOUTH DAILY (Patient taking differently: Take 10 mg by mouth See admin instructions. taking 3 days per week) 90 tablet 1  . SALINE NASAL SPRAY NA Place into the nose.    Marland Kitchen UNABLE TO FIND Fluticasone propionate nasal spray    . Vitamin D, Ergocalciferol, (DRISDOL) 1.25 MG (50000 UNIT) CAPS capsule Take 1 capsule (50,000 Units total) by mouth every 14 (fourteen) days. 6 capsule 4   No current facility-administered medications for this visit.   Allergies: Allergies  Allergen Reactions  . Pramoxine Hcl  REACTION: irritation on rectal area   I reviewed her past medical history, social history, family history, and environmental history and no significant changes have been reported from her previous visit.  Review of Systems  Constitutional: Negative for appetite change, chills, fever and unexpected weight change.  HENT: Positive for ear pain. Negative for congestion, postnasal drip  and rhinorrhea.   Eyes: Negative for itching.  Respiratory: Negative for cough, chest tightness, shortness of breath and wheezing.   Cardiovascular: Negative for chest pain.  Gastrointestinal: Negative for abdominal pain.  Genitourinary: Negative for difficulty urinating.  Skin: Negative for rash.  Allergic/Immunologic: Negative for environmental allergies and food allergies.   Objective: BP 126/82   Pulse 78   Temp 98.3 F (36.8 C) (Temporal)   Resp 16   LMP 11/29/1986   SpO2 98%  There is no height or weight on file to calculate BMI. Physical Exam Vitals and nursing note reviewed.  Constitutional:      Appearance: Normal appearance. She is well-developed.  HENT:     Head: Normocephalic and atraumatic.     Right Ear: Tympanic membrane and external ear normal.     Left Ear: Tympanic membrane and external ear normal.     Nose: Nose normal. No congestion or rhinorrhea.     Mouth/Throat:     Mouth: Mucous membranes are moist.     Pharynx: Oropharynx is clear.  Eyes:     Conjunctiva/sclera: Conjunctivae normal.  Cardiovascular:     Rate and Rhythm: Normal rate and regular rhythm.     Heart sounds: Normal heart sounds. No murmur heard.  No friction rub. No gallop.   Pulmonary:     Effort: Pulmonary effort is normal.     Breath sounds: Normal breath sounds. No wheezing, rhonchi or rales.  Musculoskeletal:     Cervical back: Neck supple.  Skin:    General: Skin is warm.     Findings: No rash.  Neurological:     Mental Status: She is alert and oriented to person, place, and time.  Psychiatric:        Mood and Affect: Mood normal.        Behavior: Behavior normal.    Previous notes and tests were reviewed. The plan was reviewed with the patient/family, and all questions/concerned were addressed.  It was my pleasure to see Leslie Reed today and participate in her care. Please feel free to contact me with any questions or concerns.  Sincerely,  Rexene Alberts, DO Allergy &  Immunology  [facility] office: [phone] [facility] office: [place] office: [phone]

## 2020-07-14 ENCOUNTER — Encounter: Payer: Self-pay | Admitting: Critical Care Medicine

## 2020-07-14 ENCOUNTER — Other Ambulatory Visit: Payer: Self-pay

## 2020-07-14 ENCOUNTER — Ambulatory Visit: Payer: 59 | Admitting: Critical Care Medicine

## 2020-07-14 VITALS — BP 130/78 | HR 85 | Temp 97.2°F | Ht 64.0 in | Wt 144.0 lb

## 2020-07-14 DIAGNOSIS — J479 Bronchiectasis, uncomplicated: Secondary | ICD-10-CM

## 2020-07-14 DIAGNOSIS — D803 Selective deficiency of immunoglobulin G [IgG] subclasses: Secondary | ICD-10-CM

## 2020-07-14 DIAGNOSIS — D804 Selective deficiency of immunoglobulin M [IgM]: Secondary | ICD-10-CM

## 2020-07-14 DIAGNOSIS — J988 Other specified respiratory disorders: Secondary | ICD-10-CM | POA: Diagnosis not present

## 2020-07-14 NOTE — Progress Notes (Signed)
Synopsis: Referred in April 2021 for chronic cough by Panosh, Standley Brooking, MD.  Subjective:   PATIENT ID: Arvilla Market GENDER: female DOB: October 05, 1946, MRN: [id number]  Chief Complaint  Patient presents with  . Follow-up    "i've been feeling okay"    Ms. 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  She has been doing well with azelastine and fluticasone nasal sprays.  She only has to use Vicks vaporub for nasal congestion when she is having sinus headaches to help optimize drainage.  She is only using her saline nasal washes intermittently.  She saw Dr. Maudie Mercury and immunology on 6/21 & 7/21, who recommended immunoglobulin injections.  She has been unsure if she wants to pursue this because she feels that her symptoms are at her baseline, which she has learned to deal with over time.  She has recurrent dental issue with a tooth that she has had a root canal on previously, and has been recommended to have another root canal.  This has been going on for several years and she initially put off due to Covid.  She is having pain on the left side of her face. No recent infections. Doing well with wearing her mask and covid precautions.  She is interested in getting a third Covid vaccine if indicated.    OV 04/01/20: Mrs. Roszak is a 74 year old woman who presents for follow-up of chronic cough and dyspnea on exertion.  She has family history of ILD in her mother, but has no previous history of lung disease.  No change in her symptoms since her last visit.  She has chronic sinus issues, but has not been requiring antibiotics recently.  Her symptoms are always worse when there is a change in humidity, but are generally well controlled with Vicks VapoRub.  No fever, chills, sweats, weight loss, fatigue.  She presents today to follow-up multiple studies been completed since her last visit.   OV  03/06/20: Mrs. Deeley is a 74 year old woman who presents for evaluation of dyspnea on exertion for the past 2 years and chronic cough.  She has had a chronic cough due to postnasal drip for many years.  She has a long history of sinus headaches and sinus congestion for which she uses Vicks VapoRub (on lips, chest, under nose) which seems to resolve her symptoms.  She has tried over-the-counter medications which have not helped as well.  When she was younger she had allergy testing, revealing that she was allergic to multiple foods and environmental allergens.  She coughs up phlegm first thing when waking up, but otherwise has a dry cough throughout the day.  She thinks CPAP and weather changes, especially humid weather make her sinus symptoms worse.  She has tried albuterol in the past without relief of her symptoms.  Her shortness of breath has been slowly progressive over the last 2 years.  She is no longer able to do things like walk up 2 flights of stairs as easily she previously could.  She is very active.  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  She recently was resealing break was exposed to lots of direct dust.  She never wears a respirator.  She has been exposed to asbestos in the past.  She has never smoked or vape.  Family history is notable for IPF in her mother  who died in her 41s.  Both parents were heavy smokers.  Her shortness of breath is associated with mild central chest pressure and sometimes lightheadedness.  No nausea or sweating.  No previous history of coronary artery disease.  Her father had cardiac disease.  She had a normal treadmill stress test about 15 years ago with her primary care provider. Reviewed PCP note from 12/18/19. UTD covid, flu, pneumonia vaccines.   Past Medical History:  Diagnosis Date  . Allergic rhinitis   . Allergy   . Arthritis    hands, lower back  . Bronchitis    uses inhaler prn  .  Cataract    just being monitored - no surgery  . Colitis 8/04  . Diverticulitis   . GERD (gastroesophageal reflux disease)    diet controlled, no meds  . History of migraine headaches   . Hx of colonic polyp   . Hx of varicella   . Hyperlipidemia   . Insomnia   . 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 Mitral valve prolapse   . Osteopenia   . Scoliosis 2005  . Sleep apnea   . Tick bite 2014   STARI-antibiotics x 5 weeks  . Tracheobronchopathia-osteochondroplastica      Family History  Problem Relation Age of Onset  . Diabetes Mother   . Pulmonary fibrosis Mother        58 deceased  . Hypertension Mother   . Hypertension Father   . Alzheimer's disease Father   . Kidney failure Father   . Diabetes Maternal Grandmother   . Cancer Maternal Grandfather        ??  . Hypertension Brother   . Diabetes Sister   . Diabetes Son   . Allergy (severe) Son   . Depression Son   . Thyroid disease Daughter   . Psoriasis Daughter   . Thyroid disease Daughter   . Colon cancer Neg Hx   . Esophageal cancer Neg Hx   . Rectal cancer Neg Hx   . Stomach cancer Neg Hx      Past Surgical History:  Procedure Laterality Date  . ABDOMINAL HYSTERECTOMY     partial  for pain no cancer    . APPENDECTOMY    . BREAST BIOPSY Right    x 2   . BRONCHIAL WASHINGS  04/22/2020   Procedure: BRONCHIAL lavage;  Surgeon: Julian Hy, DO;  Location: [facility];  Service: Endoscopy;;  . CESAREAN SECTION     x4  . CHOLECYSTECTOMY    . colon polyps    . COLONOSCOPY    . DILATION AND CURETTAGE OF UTERUS     x 2  . VIDEO BRONCHOSCOPY N/A 04/22/2020   Procedure: VIDEO BRONCHOSCOPY WITHOUT FLUORO;  Surgeon: Julian Hy, DO;  Location: [facility];  Service: Endoscopy;  Laterality: N/A;  . WISDOM TOOTH EXTRACTION      Social History   Socioeconomic History  . Marital status: Married    Spouse name: Not on file  . Number of children: Not on file  . Years of education: Not on file  . Highest  education level: Not on file  Occupational History  . Not on file  Tobacco Use  . Smoking status: Passive Smoke Exposure - Never Smoker  . Smokeless tobacco: Never Used  Vaping Use  . Vaping Use: Never used  Substance and Sexual Activity  . Alcohol use: Yes    Comment: occ  . Drug use: No  . Sexual activity: Not  Currently    Partners: Male    Birth control/protection: Surgical, Post-menopausal    Comment: TAH  Other Topics Concern  . 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 Difficulty of Paying Living Expenses:   Food Insecurity:   . Worried About Charity fundraiser in the Last Year:   . Arboriculturist in the Last Year:   Transportation Needs:   . Film/video editor (Medical):   Marland Kitchen Lack of Transportation (Non-Medical):   Physical Activity:   . Days of Exercise per Week:   . Minutes of Exercise per Session:   Stress:   . Feeling of Stress :   Social Connections:   . Frequency of Communication with Friends and Family:   . Frequency of Social Gatherings with Friends and Family:   . Attends Religious Services:   . Active Member of Clubs or Organizations:   . Attends Archivist Meetings:   Marland Kitchen Marital Status:   Intimate Partner Violence:   . Fear of Current or Ex-Partner:   . Emotionally Abused:   Marland Kitchen Physically Abused:   . Sexually Abused:      Allergies  Allergen Reactions  . Pramoxine Hcl     REACTION: irritation on rectal area     Immunization History  Administered Date(s) Administered  . Influenza Split 08/24/2013  . Influenza, High Dose Seasonal PF 09/12/2017, 08/16/2018, 08/20/2019  . Moderna SARS-COVID-2 Vaccination 01/21/2020, 01/31/2020  . Pneumococcal  Conjugate-13 12/18/2019  . Pneumococcal Polysaccharide-23 12/07/2016  . Tdap 04/18/2013    Outpatient Medications Prior to Visit  Medication Sig Dispense Refill  . azelastine (ASTELIN) 0.1 % nasal spray Place 2 sprays into both nostrils 2 (two) times daily. 30 mL 5  . BIOTIN PO Take by mouth.    . Minoxidil (ROGAINE EX) Apply topically.    Marland Kitchen Respiratory Therapy Supplies (FLUTTER) DEVI 1 each by Does not apply route 2 (two) times daily. 1 each 0  . rosuvastatin (CRESTOR) 10 MG tablet TAKE 1 TABLET BY MOUTH DAILY (Patient taking differently: Take 10 mg by mouth See admin instructions. taking 3 days per week) 90 tablet 1  . SALINE NASAL SPRAY NA Place into the nose.    Marland Kitchen UNABLE TO FIND Fluticasone propionate nasal spray    . Vitamin D, Ergocalciferol, (DRISDOL) 1.25 MG (50000 UNIT) CAPS capsule Take 1 capsule (50,000 Units total) by mouth every 14 (fourteen) days. 6 capsule 4   No facility-administered medications prior to visit.    Review of Systems  Constitutional: Negative for chills and fever.  HENT: Positive for congestion and sinus pain.   Respiratory: Positive for cough, sputum production and shortness of breath. Negative for wheezing.   Cardiovascular: Positive for chest pain. Negative for leg swelling.  Gastrointestinal: Negative for abdominal pain, heartburn, nausea and vomiting.  Musculoskeletal: Negative for myalgias.       Hand arthritis- worse after activity  Neurological: Negative for weakness.  Endo/Heme/Allergies: Positive for environmental allergies.     Objective:   Vitals:   07/14/20 0944  BP: 130/78  Pulse: 85  Temp: (!) 97.2  F (36.2 C)  SpO2: 96%  Weight: 144 lb (65.3 kg)  Height: 5\' 4"  (1.626 m)   96% on   RA BMI Readings from Last 3 Encounters:  07/14/20 24.72 kg/m  06/17/20 24.57 kg/m  05/19/20 24.41 kg/m   Wt Readings from Last 3 Encounters:  07/14/20 144 lb (65.3 kg)  06/17/20 142 lb (64.4 kg)  05/19/20 140 lb (63.5 kg)    Physical  Exam Vitals reviewed.  Constitutional:      General: She is not in acute distress.    Appearance: She is not ill-appearing.  HENT:     Head: Normocephalic and atraumatic.  Eyes:     General: No scleral icterus. Cardiovascular:     Rate and Rhythm: Normal rate and regular rhythm.     Heart sounds: No murmur heard.   Pulmonary:     Comments: Breathing comfortably on room air, no conversational dyspnea, clear to auscultation bilaterally. Abdominal:     General: There is no distension.     Palpations: Abdomen is soft.  Musculoskeletal:        General: No swelling or deformity.     Cervical back: Neck supple.  Skin:    General: Skin is warm and dry.     Findings: No rash.  Neurological:     General: No focal deficit present.     Mental Status: She is alert.     Coordination: Coordination normal.  Psychiatric:        Mood and Affect: Mood normal.        Behavior: Behavior normal.      CBC    Component Value Date/Time   WBC 4.5 04/01/2020 0954   RBC 4.26 04/01/2020 0954   HGB 13.3 04/01/2020 0954   HGB 12.5 07/01/2015 1354   HCT 38.7 04/01/2020 0954   PLT 266.0 04/01/2020 0954   MCV 90.8 04/01/2020 0954   MCH 30.5 05/28/2014 1311   MCHC 34.5 04/01/2020 0954   RDW 13.8 04/01/2020 0954   LYMPHSABS 1.3 04/01/2020 0954   MONOABS 0.5 04/01/2020 0954   EOSABS 0.0 04/01/2020 0954   BASOSABS 0.0 04/01/2020 0954    CHEMISTRY No results for input(s): NA, K, CL, CO2, GLUCOSE, BUN, CREATININE, CALCIUM, MG, PHOS in the last 168 hours. CrCl cannot be calculated (Patient's most recent lab result is older than the maximum 21 days allowed.).  Cultures 04/22/20 from bronchoscopy: Fungus-negative AFB-negative Respiratory culture- normal flora  Chest Imaging- films reviewed: CXR, 2 view 12/21/2016-hyperinflation, increased interstitial markings bilaterally, airway thickening versus bronchiectasis left upper lobe.  HRCT chest 03/18/2020- No architectural distortion or fibrosis.   Mild biapical scarring.  Central bronchiectasis.  Few peripheral tree-in-bud nodules.  Areas of peripheral atelectasis in RML and lingula.  Nodularity on anterior and lateral walls of the trachea, noncalcified.  No significant mediastinal or hilar adenopathy.  Air trapping on expiratory films.  Pulmonary Functions Testing Results: PFT Results Latest Ref Rng & Units 03/14/2020 07/07/2016  FVC-Pre L 2.99 3.02  FVC-Predicted Pre % 101 99  FVC-Post L 2.94 3.08  FVC-Predicted Post % 99 100  Pre FEV1/FVC % % 81 84  Post FEV1/FCV % % 84 85  FEV1-Pre L 2.43 2.54  FEV1-Predicted Pre % 108 109  FEV1-Post L 2.46 2.60  DLCO uncorrected ml/min/mmHg 21.20 22.21  DLCO UNC% % 108 90  DLCO corrected ml/min/mmHg 21.20 22.27  DLCO COR %Predicted % 108 90  DLVA Predicted % 122 98  TLC L 5.03 5.20  TLC % Predicted % 98  102  RV % Predicted % 95 100  2021-no significant obstruction or bronchodilator reversibility.  No restriction.  Normal diffusion.  2017- no significant obstruction or bronchodilator reversibility.  No restriction.  Normal diffusion.  Echocardiogram 03/18/2020- LVEF 60 to 65%.  Normal LA, RV, RA.  Thickening with mild AR, normal valves.     Assessment & Plan:     ICD-10-CM   1. 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 Bronchiectasis without complication ([place])  B93.9   4. Tracheobronchopathia-osteochondroplastica  J98.8     Dyspnea on exertion and chronic cough- likely related to chronic bronchiectasis and chronic sinus disease.  Stable today.  -Continue azelastine and Flonase nasal sprays -Discussed the importance of infection avoidance.  She is a good candidate for a third Covid vaccine due to her immunodeficiency.  She needs to remain vigilant about the possibility of Covid infection despite vaccination.  Needs annual flu vaccine.   Deficiency of IgG and IgM -Appreciate Dr. Julianne Rice input.  Agree with recommendation for immunoglobulin injections.  TPO   -Predisposes to respiratory infections, but otherwise is slowing progressive without specific treatments.  Infection avoidance is most important.  RTC in 3 months.   Current Outpatient Medications:  .  azelastine (ASTELIN) 0.1 % nasal spray, Place 2 sprays into both nostrils 2 (two) times daily., Disp: 30 mL, Rfl: 5 .  BIOTIN PO, Take by mouth., Disp: , Rfl:  .  Minoxidil (ROGAINE EX), Apply topically., Disp: , Rfl:  .  Respiratory Therapy Supplies (FLUTTER) DEVI, 1 each by Does not apply route 2 (two) times daily., Disp: 1 each, Rfl: 0 .  rosuvastatin (CRESTOR) 10 MG tablet, TAKE 1 TABLET BY MOUTH DAILY (Patient taking differently: Take 10 mg by mouth See admin instructions. taking 3 days per week), Disp: 90 tablet, Rfl: 1 .  SALINE NASAL SPRAY NA, Place into the nose., Disp: , Rfl:  .  UNABLE TO FIND, Fluticasone propionate nasal spray, Disp: , Rfl:  .  Vitamin D, Ergocalciferol, (DRISDOL) 1.25 MG (50000 UNIT) CAPS capsule, Take 1 capsule (50,000 Units total) by mouth every 14 (fourteen) days., Disp: 6 capsule, Rfl: 4     Julian Hy, DO [facility] 07/14/2020 10:21 AM

## 2020-07-14 NOTE — Patient Instructions (Addendum)
Thank you for visiting Dr. Carlis Abbott at [facility]. We recommend the following:   Recommend third Moderna vaccine given immunodeficiency. Recommend starting immunoglobulin injections per Dr. Julianne Rice recommendations.  Keep nasal sprays the same.  Return in about 3 months (around 10/14/2020).    Please do your part to reduce the spread of COVID-19.

## 2020-08-03 ENCOUNTER — Other Ambulatory Visit: Payer: Self-pay | Admitting: Obstetrics & Gynecology

## 2020-08-03 DIAGNOSIS — E559 Vitamin D deficiency, unspecified: Secondary | ICD-10-CM

## 2020-08-05 NOTE — Telephone Encounter (Signed)
Medication refill request: Vit D 50000IU Last AEX:  06/17/20 Next AEX: not scheduled Last MMG (if hormonal medication request): NA Refill authorized: 12/0

## 2020-08-05 NOTE — Telephone Encounter (Signed)
Rx denied as this was refilled 06/17/2020, last appt I had with pt.  May want to call pharmacy to make sure rx has been received.

## 2020-08-12 ENCOUNTER — Other Ambulatory Visit: Payer: Self-pay | Admitting: Obstetrics & Gynecology

## 2020-08-14 ENCOUNTER — Other Ambulatory Visit: Payer: Self-pay

## 2020-08-14 ENCOUNTER — Ambulatory Visit: Payer: 59 | Admitting: Sports Medicine

## 2020-08-14 VITALS — BP 124/76 | Ht 64.0 in | Wt 144.0 lb

## 2020-08-14 DIAGNOSIS — M02369 Reiter's disease, unspecified knee: Secondary | ICD-10-CM | POA: Diagnosis not present

## 2020-08-14 DIAGNOSIS — M02339 Reiter's disease, unspecified wrist: Secondary | ICD-10-CM

## 2020-08-14 NOTE — Progress Notes (Signed)
Office Visit Note   Patient: Leslie Reed           Date of Birth: Dec 19, 1945           MRN: [id number] Visit Date: 08/14/2020 Requested by: Burnis Medin, MD [place],  [place] [address] PCP: Burnis Medin, MD  Subjective: CC: Right Knee swelling, Bilateral Wrist pain  HPI: Patient is a 74 year old female presented to clinic today with concerns of right knee swelling as well as bilateral wrist pain.  She states that this pain started approximately 3 weeks ago on 21 August shortly after getting her coronavirus vaccine as well as antibiotics for an inflamed root canal. She states that she received her coronavirus vaccine on 17 August and then the next day she saw her dentist who was concerned about a tooth infection and gave her amoxicillin.  Within 2 days, she started to notice swelling of bilateral knees (with her right affected far more than her left), she also noticed pain in the posterior aspect of both of her wrists, as well as pain in both of her ankles.  This pain lasted for several days and then started to fade on its own.  She had several pain-free days, but then a few days ago she started to notice swelling returning in her right knee.  Additionally, she has a return of pain on the posterior aspect of her wrists (with the left affected more than the right). She denies any fevers or chills to accompany the symptoms and no trauma that she can recall.  She denies any significant redness with any of the affected joints.  She has tried compression, icing, and elevation with some improvement.  Overall, she feels much better today than she did previously-but was concerned that there may be some infection causing the symptoms.  She has a history of tickborne illness, and says that she feels very similar to when she had this infection.  She has searched her body for any tick bites and has not found any to date.  No rashes.              Past HX;  Now under evaluation for  immune deficiency with Low IGG and IGM levels.  This relates to chronic cough and sinus sxs.  ROS:   All other systems were reviewed and are negative.  Objective: Vital Signs: BP 124/76   Ht 5\' 4"  (1.626 m)   Wt 144 lb (65.3 kg)   LMP 11/29/1986   BMI 24.72 kg/m   Physical Exam:  General:  Alert and oriented, in no acute distress. Cardiac: Appears well perfused. Pulm:  Breathing unlabored. Psy:  Normal mood, congruent affect. Skin: No rashes, bruising, or erythema appreciated.  Skin intact. Right Knee Exam:  General: Normal gait Standing exam: No varus or valgus deformity of the knee.  Seated Exam:  Minimal patellar crepitus.   Palpation: Mild tenderness with palpation over medial and lateral joint lines, as well as with compression of patella and patellar facets. No Tenderness on patellar tendon.  Supine exam: Mild effusion in right knee.   Ligamentous Exam:  No pain or laxity with anterior/posterior drawer.  No obvious Sag.  No pain or laxity with varus/valgus stress across the knee.   Meniscus:  McMurray with no pain or deep clicking.  Thessaly negative.   Wrist exams:  No Obvious swelling or erythema of either wrist. Does have tenderness to palpation over lunate prominence on bilateral wrists. No obvious  cystic changes.   Imaging: Limited Extremity Ultrasound- Right Knee:  Moderate suprapatellar effusion appreciated. Quadricepts tendon intact.  Medial and lateral menisci visualized intact, with no significant surrounding fluid.   Limited extremity Ultrasound- Bilateral Wrist:  Right wrist with no significant effusions appreciated throughout carpal ray or at radial or ulnar junctions.  Left wrist does demonstrate mild effusion at Radiocarpal joint and ulno-carpal joint  Impression: Effusion RT. Knee and mild swelling in left wrist  Ultrasound and interpretation by Dr. Elouise Munroe and Wolfgang Phoenix. Fields, MD   Assessment & Plan: 74 year old female presenting to clinic  with concerns of right knee swelling and bilateral wrist pain starting approximately 2 to 3 days following her coronavirus vaccine booster.  Examination as above with effusion at right knee, but no erythema or systemic symptoms to suggest septic arthritis.  Knee with full range of motion with minimal crepitus.  Bilateral wrists with no significant effusion on visual exam, however mild effusion on left wrist ultrasound.  -At this time suspect most likely cause of her arthralgias are due to reactive arthritis, either secondary to vaccination or dental infection (as patient was started on amoxicillin the day following her vaccine booster).  In either case, patient symptoms are improving. -Encouraged to maintain a healthy diet, high in antioxidants, to decrease her overall inflammation.  -Would expect this to resolve within the next 2 to 3 weeks.  -Should her symptoms worsen instead of improve, or should she experience new systemic symptoms she is promptly to return to clinic for reevaluation. -Patient has no further questions or concerns at completion of today's visit.     Procedures: No procedures performed  No notes on file

## 2020-08-19 ENCOUNTER — Telehealth: Payer: Self-pay

## 2020-08-19 NOTE — Telephone Encounter (Signed)
Patient notified of bone density results. See scanned in result.

## 2020-08-20 ENCOUNTER — Other Ambulatory Visit: Payer: Self-pay | Admitting: Internal Medicine

## 2020-09-09 ENCOUNTER — Encounter: Payer: Self-pay | Admitting: Obstetrics & Gynecology

## 2020-10-09 IMAGING — CT CT CHEST HIGH RESOLUTION W/O CM
2 of 7 series · 14 of 36 positions shown, 17 images · non-contrast
Comparison: None.

CLINICAL DATA: Shortness of breath on exertion, cough.

EXAM:
CT CHEST WITHOUT CONTRAST
TECHNIQUE: Multidetector CT imaging of the chest was performed following the
standard protocol without intravenous contrast. High resolution
imaging of the lungs, as well as inspiratory and expiratory imaging,
was performed.

[Series 4: high resolution · axial · 0.59mm/px · z∈[-292,-66]mm · 11 of 272 slices shown, 14 images]
[im 23/272  mediastinal]
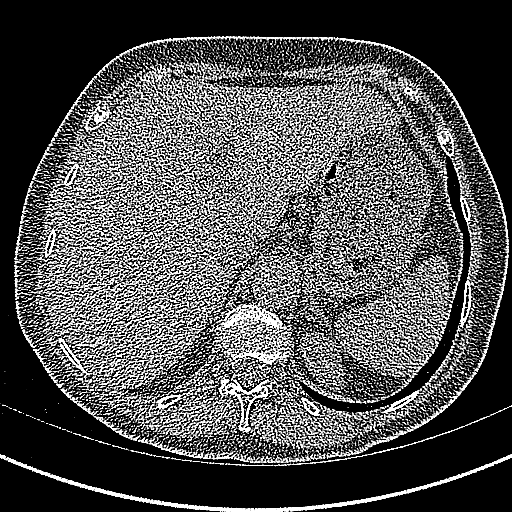
[im 23/272  lung]
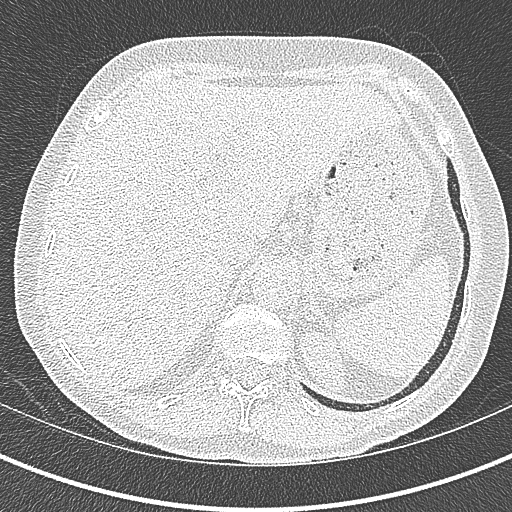
[im 46/272  lung]
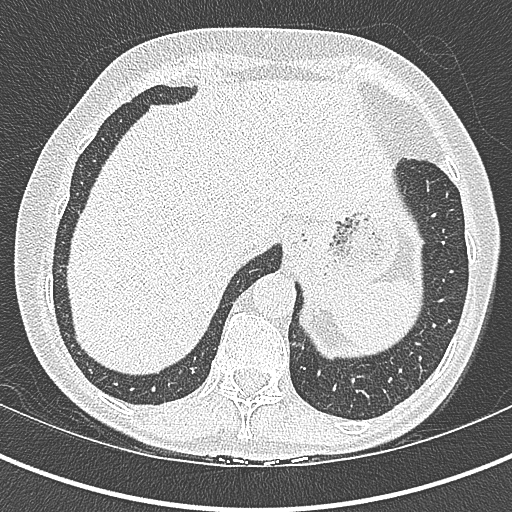
[im 68/272  lung]
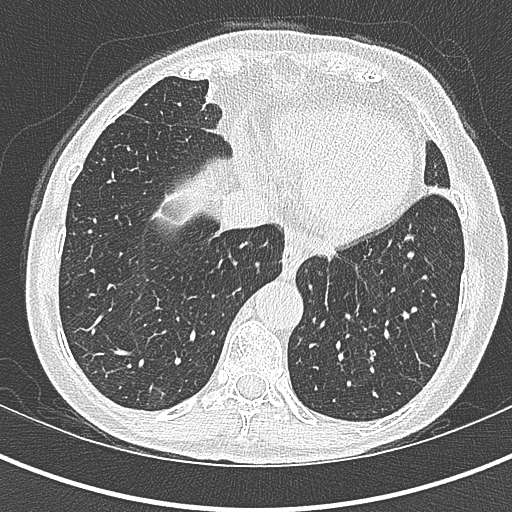
[im 91/272  lung]
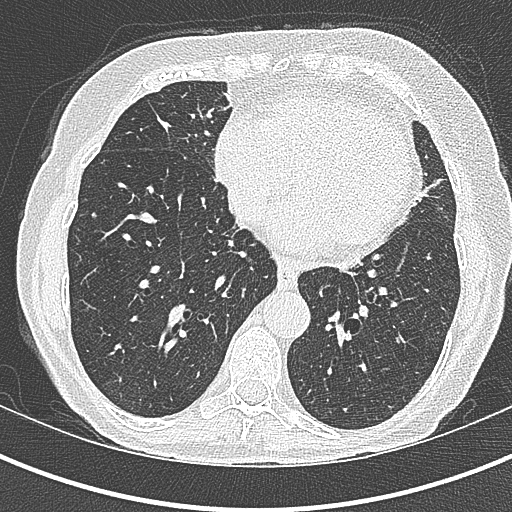
[im 113/272  mediastinal]
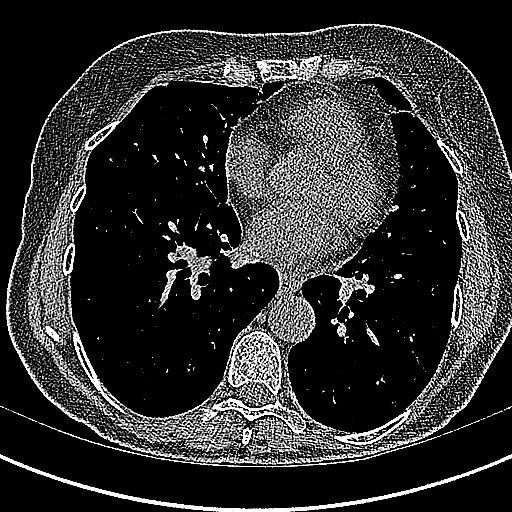
[im 113/272  lung]
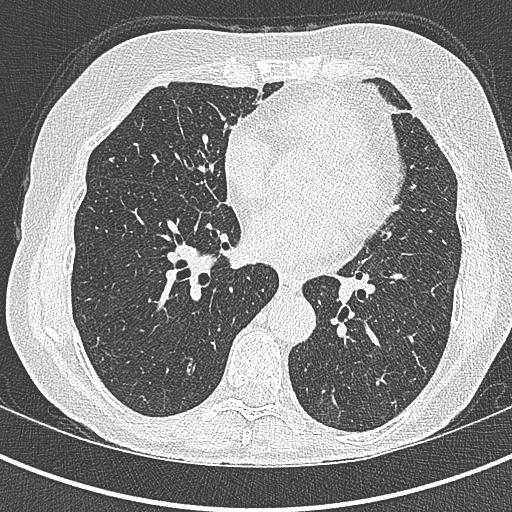
[im 136/272  lung]
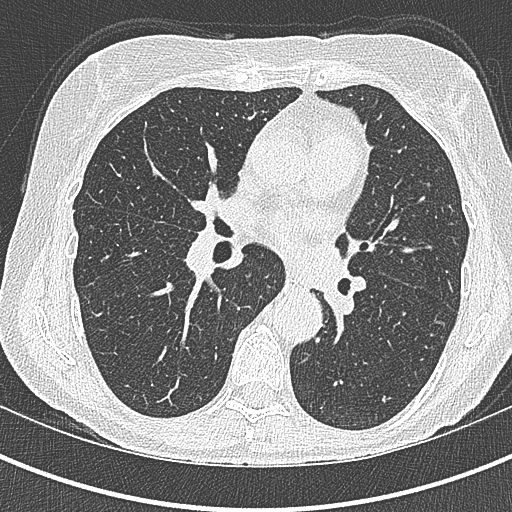
[im 159/272  lung]
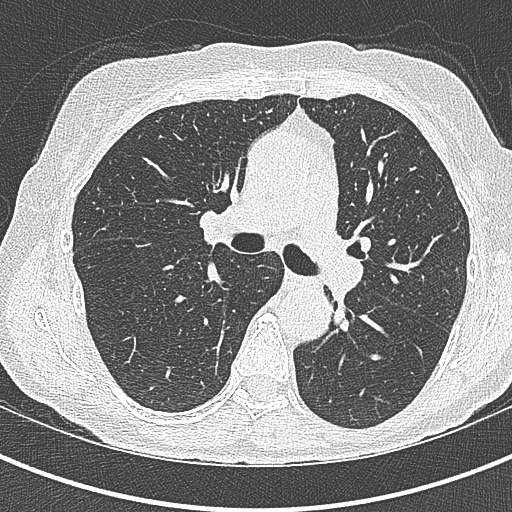
[im 181/272  lung]
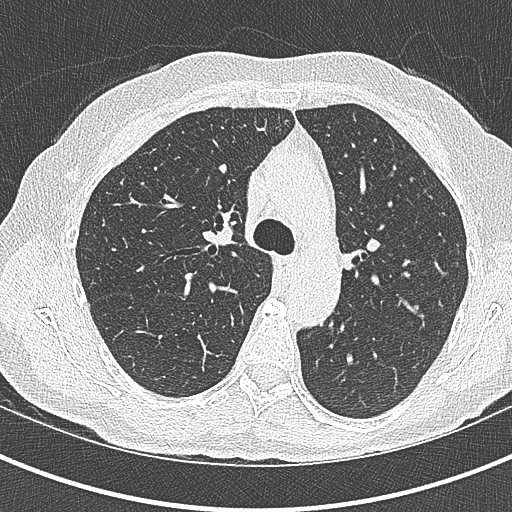
[im 204/272  mediastinal]
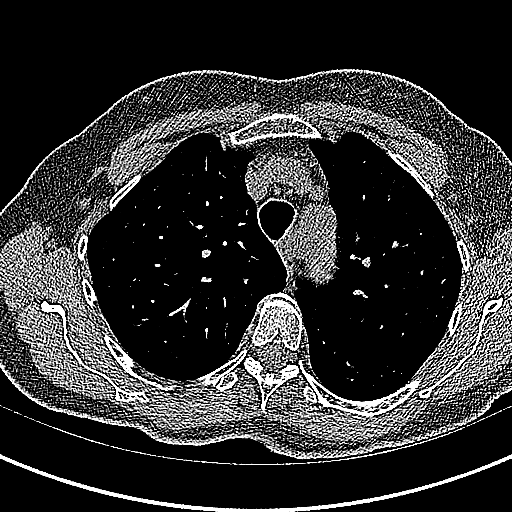
[im 204/272  lung]
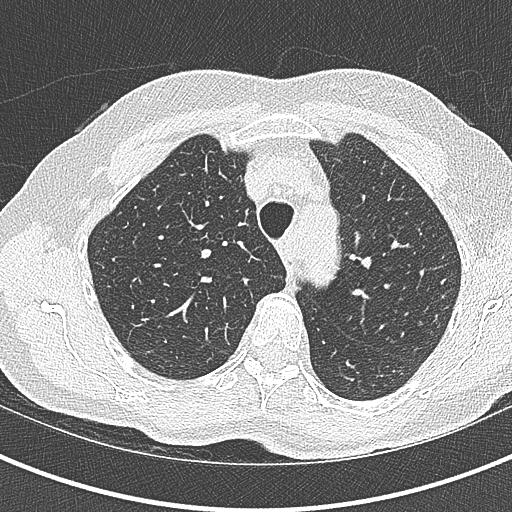
[im 226/272  lung]
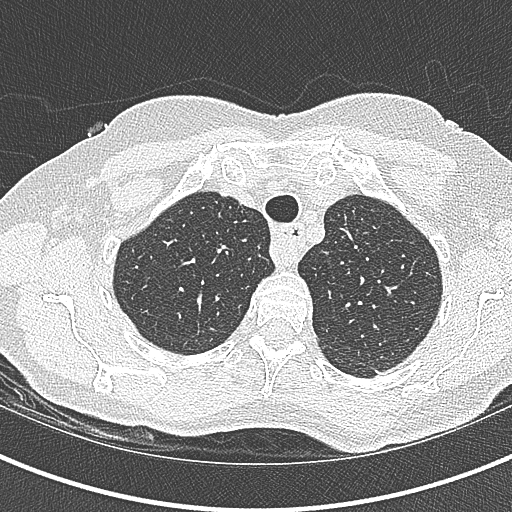
[im 249/272  lung]
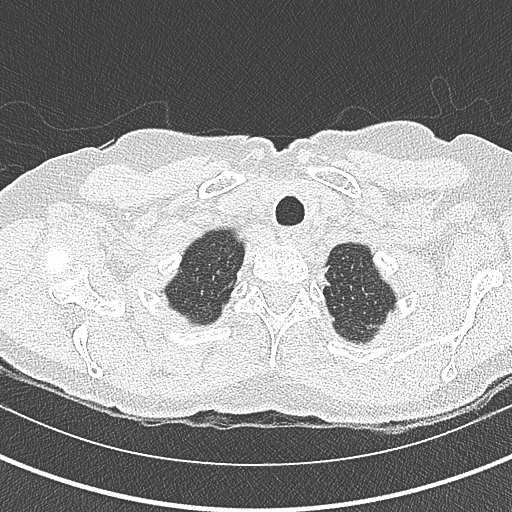

[Series 6: coronal · coronal · 0.59mm/px · 3 of 121 slices shown]
[im 25/121  lung]
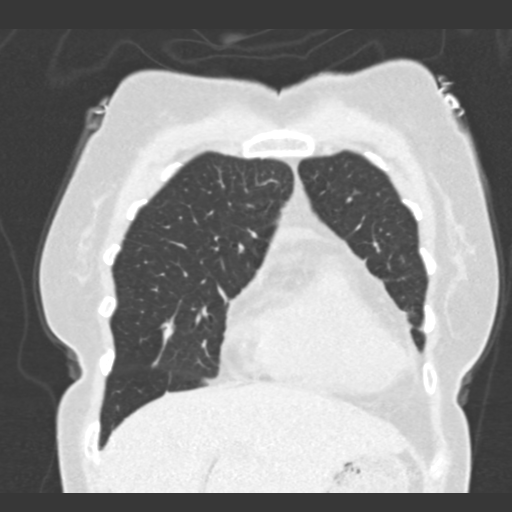
[im 49/121  lung]
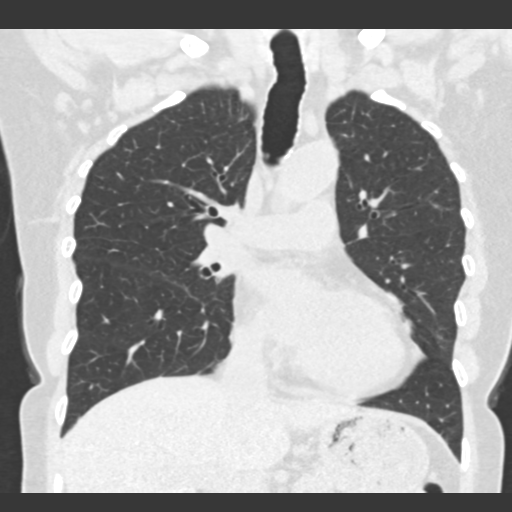
[im 73/121  lung]
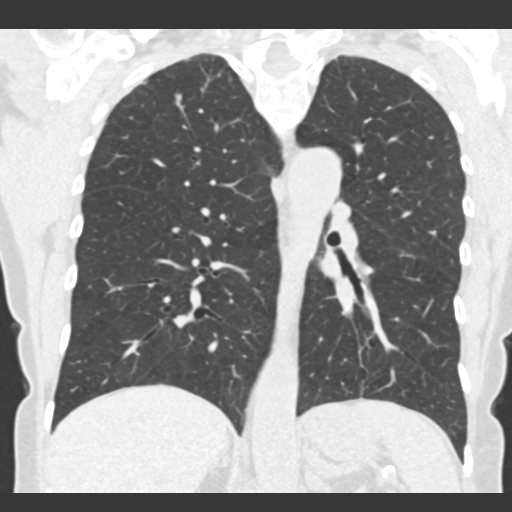

[14 of 36 positions shown; findings below may reference images not displayed]

FINDINGS: Cardiovascular: Aberrant right subclavian artery. Heart size normal.
No pericardial effusion.

Mediastinum/Nodes: No pathologically enlarged mediastinal or
axillary lymph nodes. Hilar regions are difficult to evaluate
without IV contrast but appear grossly unremarkable. Esophagus is
grossly unremarkable.

Lungs/Pleura: Mild biapical pleuroparenchymal scarring. Mild central
bronchiectasis. Mild volume loss in the right middle lobe and
lingula. Peripheral peribronchovascular nodularity and mucoid
impaction in the right lower lobe, likely postinfectious in
etiology. Negative for subpleural reticulation, traction
bronchiectasis/bronchiolectasis, ground-glass, architectural
distortion or honeycombing. Left lower lobe nodules measure up to 3
mm (3/82). No pleural fluid. Mild nodularity along the ventral wall
of the trachea can be seen with tracheobronchopathia
osteochondroplastica. There is air trapping.

Upper Abdomen: Visualized portions of the liver, adrenal glands,
kidneys, spleen, pancreas, stomach and bowel are grossly
unremarkable. Partially calcified lesion in the splenic hilum
measures 7 mm, likely a splenic artery aneurysm.

Musculoskeletal: Degenerative changes in the spine. No worrisome
lytic or sclerotic lesions.
IMPRESSION: 1. No evidence of interstitial lung disease.
2. Mild central bronchiectasis.
3. Air trapping is indicative of small airways disease.
4. Pulmonary nodules measure 3 mm or less in size. No follow-up
needed if patient is low-risk (and has no known or suspected primary
neoplasm). Non-contrast chest CT can be considered in 12 months if
patient is high-risk. This recommendation follows the consensus
statement: Guidelines for Management of Incidental Pulmonary Nodules
Detected on CT Images: From the [HOSPITAL] 9668; Radiology
5. Probable small splenic artery aneurysm.

## 2020-10-22 ENCOUNTER — Encounter: Payer: Self-pay | Admitting: Allergy

## 2020-10-22 ENCOUNTER — Ambulatory Visit: Payer: 59 | Admitting: Allergy

## 2020-10-22 ENCOUNTER — Other Ambulatory Visit: Payer: Self-pay

## 2020-10-22 VITALS — BP 128/70 | HR 78 | Temp 98.2°F | Resp 18 | Ht 63.0 in | Wt 147.8 lb

## 2020-10-22 DIAGNOSIS — D801 Nonfamilial hypogammaglobulinemia: Secondary | ICD-10-CM

## 2020-10-22 DIAGNOSIS — J310 Chronic rhinitis: Secondary | ICD-10-CM

## 2020-10-22 DIAGNOSIS — J479 Bronchiectasis, uncomplicated: Secondary | ICD-10-CM

## 2020-10-22 NOTE — Progress Notes (Signed)
Follow Up Note  RE: Leslie Reed MRN: [id number] DOB: 1946/11/14 Date of Office Visit: 10/22/2020  Referring provider: Burnis Medin, MD Primary care provider: Burnis Medin, MD  Chief Complaint: Follow-up (questions concerning nasal sprays and antibodies)  History of Present Illness: I had the pleasure of seeing Leslie Reed for a follow up visit at the [facility] on 10/27/2020. She is a 74 y.o. female, who is being followed for hypogammaglobulinemia, bronchiectasis, chronic rhinitis. Her previous allergy office visit was on 06/18/2020 with Dr. Maudie Mercury. Today is a regular follow up visit.  Hypogammaglobulinemia:  Patient is hesitant about starting the infusions and has questions about the infusions.  She would like to start after the new year as she has a lot going on in her personal life - taking care of her son who is having some medical issues.   Patient had antibiotics by the dentist for potential tooth infection.  She is having issues with her joints now.   Patient had jaw pain from the cpap machine.   No other URI symptoms. Up to date with all vaccinations.   Breathing:  Having some coughing at times. Triggers include dust. No inhaler use. Using flutter valve at least once a day.   Denies any SOB, wheezing, chest tightness, nocturnal awakenings, ER/urgent care visits or prednisone use since the last visit.  Chronic rhinitis Currently using Flonase and azelastine with good benefit but had some nosebleeds the last 3 weeks.   Patient has cataracts and osteopenia. She is concerned about the long term use of Flonase - using 1 spray per nostril once a day.  She has decreased the statin medications.   Rash:  Some redness on the neck where she had some stiches. Removed the bandage due to this.   07/14/2020 pulmonology visit: "Dyspnea on exertion and chronic cough- likely related to chronic bronchiectasis and chronic sinus disease.  Stable today.   -Continue azelastine and Flonase nasal sprays -Discussed the importance of infection avoidance.  She is a good candidate for a third Covid vaccine due to her immunodeficiency.  She needs to remain vigilant about the possibility of Covid infection despite vaccination.  Needs annual flu vaccine.  Deficiency of IgG and IgM -Appreciate Dr. Julianne Rice input.  Agree with recommendation for immunoglobulin injections.  TPO  -Predisposes to respiratory infections, but otherwise is slowing progressive without specific treatments.  Infection avoidance is most important."  Assessment and Plan: Leslie Reed is a 74 y.o. female with: Hypogammaglobulinemia ([place]) Past history - Patient referred by pulmonology for low IgG and IgM. History of frequent upper respiratory infections, mild central bronchiectasis, mother who passed away from IPF. Up to date with COVID-19 vaccine, flu, pneumovax and prevnar.  2021 bloodwork diptheria, tetanus and s. Pneumonia titers adequate. Alpha 1 level and total complement level normal. Interim history -  Wiling to start IgG replacement therapy in 2022 - currently has lot of stressors at home. Possibly one tooth infection and one course of antibiotics by dentist since last visit. No history of meningitis or blood clots.   Start at home subcutaneous IgG replacement therapy.  Discussed the most common side effects (localized discomfort, headache, fatigue) and the most serious side effects (increased risk for thrombosis, meningitis)  Start with Cuvitru 628m/kg SQ infusions every 2 weeks given her bronchiectasis.  Get CBC diff, IgG, IgM, IgA, ESR, CMP every 3 months - may need to adjust dosing depending on results.   Keep track of infections.  Bronchiectasis without complication [facility]) Patient follows with pulmonology. Coughing at times. No inhaler use and using flutter valve once a day.  Follow up with pulmonology and their recommendations.  Continue to use the flutter valve  daily.  Chronic rhinitis Past history - Perennial rhinitis symptoms and using Vick's vapor rub. 2021 skin testing was negative to environmental allergies.  Interim history - stable with below regimen. Try to use fluticasone 1 spray per nostril every other day for nasal congestion - given nosebleeds and her concerns about long term use.  May use azelastine nasal spray 1-2 sprays per nostril twice a day as needed for runny nose/drainage.  May use nasal saline spray (i.e., Simply Saline) as needed.  Return in about 3 months (around 01/22/2021).   Follow up with PCP regarding the vitamin D level and dose.  Follow up with orthopedics regarding joint pains.   Diagnostics: None.  Medication List:  Current Outpatient Medications  Medication Sig Dispense Refill  . azelastine (ASTELIN) 0.1 % nasal spray Place 2 sprays into both nostrils 2 (two) times daily. 30 mL 5  . Minoxidil (ROGAINE EX) Apply topically.    Marland Kitchen Respiratory Therapy Supplies (FLUTTER) DEVI 1 each by Does not apply route 2 (two) times daily. 1 each 0  . rosuvastatin (CRESTOR) 10 MG tablet TAKE 1 TABLET BY MOUTH DAILY 90 tablet 1  . Vitamin D, Ergocalciferol, (DRISDOL) 1.25 MG (50000 UNIT) CAPS capsule Take 1 capsule (50,000 Units total) by mouth every 14 (fourteen) days. 6 capsule 4  . BIOTIN PO Take by mouth. (Patient not taking: Reported on 10/22/2020)    . SALINE NASAL SPRAY NA Place into the nose. (Patient not taking: Reported on 10/22/2020)    . UNABLE TO FIND Fluticasone propionate nasal spray (Patient not taking: Reported on 10/22/2020)     No current facility-administered medications for this visit.   Allergies: Allergies  Allergen Reactions  . Pramoxine Hcl     REACTION: irritation on rectal area   I reviewed her past medical history, social history, family history, and environmental history and no significant changes have been reported from her previous visit.  Review of Systems  Constitutional: Negative for  appetite change, chills, fever and unexpected weight change.  HENT: Negative for congestion, ear pain, postnasal drip and rhinorrhea.   Eyes: Negative for itching.  Respiratory: Positive for cough. Negative for chest tightness, shortness of breath and wheezing.   Cardiovascular: Negative for chest pain.  Gastrointestinal: Negative for abdominal pain.  Genitourinary: Negative for difficulty urinating.  Musculoskeletal: Positive for arthralgias.  Skin: Positive for rash.  Allergic/Immunologic: Negative for environmental allergies and food allergies.   Objective: BP 128/70 (BP Location: Left Arm, Patient Position: Sitting, Cuff Size: Normal)   Pulse 78   Temp 98.2 F (36.8 C) (Temporal)   Resp 18   Ht _0  (1.6 m)   Wt 147 lb 12.8 oz (67 kg)   LMP 11/29/1986   SpO2 97%   BMI 26.18 kg/m  Body mass index is 26.18 kg/m. Physical Exam Vitals and nursing note reviewed.  Constitutional:      Appearance: Normal appearance. She is well-developed.  HENT:     Head: Normocephalic and atraumatic.     Right Ear: Tympanic membrane and external ear normal.     Left Ear: Tympanic membrane and external ear normal.     Nose: Nose normal. No congestion or rhinorrhea.     Mouth/Throat:     Mouth: Mucous membranes are moist.  Pharynx: Oropharynx is clear.  Eyes:     Conjunctiva/sclera: Conjunctivae normal.  Cardiovascular:     Rate and Rhythm: Normal rate and regular rhythm.     Heart sounds: Normal heart sounds. No murmur heard.  No friction rub. No gallop.   Pulmonary:     Effort: Pulmonary effort is normal.     Breath sounds: Normal breath sounds. No wheezing, rhonchi or rales.  Musculoskeletal:     Cervical back: Neck supple.  Skin:    General: Skin is warm.     Findings: Rash present.     Comments: Healing skin with slight erythema on the neck and suture visible. No signs of infections.   Neurological:     Mental Status: She is alert and oriented to person, place, and time.   Psychiatric:        Mood and Affect: Mood normal.        Behavior: Behavior normal.    Previous notes and tests were reviewed. The plan was reviewed with the patient/family, and all questions/concerned were addressed.  It was my pleasure to see Yarely today and participate in her care. Please feel free to contact me with any questions or concerns.  Sincerely,  Rexene Alberts, DO Allergy & Immunology  [facility] office: [place] office: [phone]  45 minutes spent face-to-face with more than 50% of the time spent discussing IgG replacement, immunodeficiency, rhinitis, epistaxis, bronchiectasis.

## 2020-10-22 NOTE — Patient Instructions (Addendum)
Low IgG:   Start at home subcutaneous IgG replacement therapy.  Tammy will get in touch with you.  Will let you know if we need updated bloodwork or not.   Keep track of infections.  Try to use fluticasone 1 spray per nostril every other day for nasal congesiton.  May use azelastine nasal spray 1-2 sprays per nostril twice a day as needed for runny nose/drainage.  May use nasal saline spray (i.e., Simply Saline) as needed.   Follow up with pulmonology and their recommendations.  Continue to use the flutter valve daily.  Follow up with your eye doctor regarding Flonase use.  Follow up with PCP regarding the vitamin D level and dose.  Follow up with orthopedics regarding your joint pains.   Follow up in 3 months or sooner if needed.  Nose Bleeds: . Nosebleeds are very common.  Site of the bleeding is typically on the septum or at the very front of the nose.  Some of the more common causes are from trauma, inflammation or medication induced. Marland Kitchen Pinch both nostrils while leaning forward for at least 5 minutes before checking to see if the bleeding has stopped. If bleeding is not controlled within 5-10 minutes apply a cotton ball soaked with oxymetazoline (Afrin) to the bleeding nostril for a few seconds.  Preventative treatment: . Apply saline nasal gel in each nostril twice a day for 2 weeks to allow the nasal mucosa to heal . Consider using a humidifier in the winter . Try to keep your blood pressure as normal as possible (120/80)

## 2020-10-22 NOTE — Progress Notes (Signed)
Will work on approval process and be in touch with patient

## 2020-10-27 ENCOUNTER — Encounter: Payer: Self-pay | Admitting: Allergy

## 2020-10-27 NOTE — Assessment & Plan Note (Addendum)
Past history - Perennial rhinitis symptoms and using Vick's vapor rub. 2021 skin testing was negative to environmental allergies.  Interim history - stable with below regimen. Try to use fluticasone 1 spray per nostril every other day for nasal congestion - given nosebleeds and her concerns about long term use.  May use azelastine nasal spray 1-2 sprays per nostril twice a day as needed for runny nose/drainage.  May use nasal saline spray (i.e., Simply Saline) as needed.

## 2020-10-27 NOTE — Assessment & Plan Note (Signed)
Patient follows with pulmonology. Coughing at times. No inhaler use and using flutter valve once a day.  Follow up with pulmonology and their recommendations.  Continue to use the flutter valve daily.

## 2020-10-27 NOTE — Assessment & Plan Note (Addendum)
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 History of frequent upper respiratory infections, mild central bronchiectasis, mother who passed away from IPF. Up to date with COVID-19 vaccine, flu, pneumovax and prevnar.  2021 bloodwork diptheria, tetanus and s. Pneumonia titers adequate. Alpha 1 level and total complement level normal. Interim history -  Wiling to start IgG replacement therapy in 2022 - currently has lot of stressors at home. Possibly one tooth infection and one course of antibiotics by dentist since last visit. No history of meningitis or blood clots.   Start at home subcutaneous IgG replacement therapy.  Discussed the most common side effects (localized discomfort, headache, fatigue) and the most serious side effects (increased risk for thrombosis, meningitis)  Start with Cuvitru 682m/kg SQ infusions every 2 weeks given her bronchiectasis.  Get CBC diff, IgG, IgM, IgA, ESR, CMP every 3 months - may need to adjust dosing depending on results.   Keep track of infections.

## 2020-10-28 ENCOUNTER — Ambulatory Visit: Payer: 59 | Admitting: Sports Medicine

## 2020-10-28 ENCOUNTER — Telehealth: Payer: Self-pay | Admitting: *Deleted

## 2020-10-28 ENCOUNTER — Other Ambulatory Visit: Payer: Self-pay

## 2020-10-28 DIAGNOSIS — M25562 Pain in left knee: Secondary | ICD-10-CM | POA: Insufficient documentation

## 2020-10-28 DIAGNOSIS — M25561 Pain in right knee: Secondary | ICD-10-CM | POA: Diagnosis not present

## 2020-10-28 DIAGNOSIS — G8929 Other chronic pain: Secondary | ICD-10-CM | POA: Diagnosis not present

## 2020-10-28 NOTE — Progress Notes (Signed)
SUBJECTIVE:   CHIEF COMPLAINT / HPI:   Bilateral knee pain [name] is a pleasant 74 year old female who is coming in for evaluation today for bilateral knee pain.  She states last week sometime after receiving the Covid vaccination she felt like her knees began to flareup.  They did become very swollen and was very painful and she could not flex all the way but felt like it got better on its own.  She denies any significant swelling today.  She does state that going up stairs gives her significant problem and pain she does feel like she cannot "trust her knees".  She did feel like they give out especially going down and putting weight on them.  She has become more sedentary and less active due to this.  She does a lot of landscaping but because of the lack of trust in her knees and giving out she has done less so.  No falls or trauma recently to explain any of her symptoms.  They are stiff in the morning it takes her a while to get going but afterward she feels like it does get better.  She is unsure if the buckling is due to weakness or if it is because of the sharp pains that she gets while going up or down stairs.  PERTINENT  PMH / PSH: History of osteopenia, hyperlipidemia, reflux  OBJECTIVE:   BP 140/88   Ht 5\' 4"  (1.626 m)   Wt 147 lb (66.7 kg)   LMP 11/29/1986   BMI 25.23 kg/m   No flowsheet data found.  Knee, Right: Inspection was negative for erythema, ecchymosis, and positive for mild effusion. No obvious bony abnormalities but did have some signs of osteophyte development. Palpation yielded no asymmetric warmth; No joint line tenderness; No condyle tenderness; Positive for patellar tenderness; Positive for patellar crepitus. Patellar and quadriceps tendons unremarkable. No obvious Baker's cyst development. ROM normal in flexion (135 degrees) and extension (0 degrees). Normal hamstring and quadriceps strength. Neurovascularly intact bilaterally. Knee, Left: .Inspection  was negative for erythema, ecchymosis, and positive for mild effusion. No obvious bony abnormalities but did have some signs of osteophyte development. Palpation yielded no asymmetric warmth; No joint line tenderness; No condyle tenderness; Positive for patellar tenderness; Positive for patellar crepitus. Patellar and quadriceps tendons unremarkable. No obvious Baker's cyst development. ROM normal in flexion (135 degrees) and extension (0 degrees). Normal hamstring and quadriceps strength. Neurovascularly intact bilaterally.  Limited MS[facility] ultrasound: Left and right knee Negative for any suprapatellar effusion and either knee.  Did have bilateral cartilage loss in the patellofemoral region in both knees more on the medial side of the trochlear groove in both knees. Interpretation: Patellofemoral chondromalacia bilaterally  Ultrasound and interpretation by Dr. Garlan Fillers and Wolfgang Phoenix. Fields, MD   ASSESSMENT/PLAN:   Knee pain, bilateral Unclear if the Covid vaccination did contribute to the swelling and acute inflammation of both knees but it is certainly possible as this has seemed to self resolve rather quickly.  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 -Given home exercises isometric knee extensions and leg raises VMO abductor strengthening exercises as well.  Given the option of using patellar tendon straps in both knees.   -Voltaren gel as needed for pain up to 4 times per  day - follow-up in 4 to 6 weeks     Nuala Alpha, DO PGY-4, Sports Medicine Fellow [place]  I observed and examined the patient with the [facility] resident and agree with assessment and plan.  Note reviewed and modified by me. Ila Mcgill, MD

## 2020-10-28 NOTE — Assessment & Plan Note (Signed)
Unclear if the Covid vaccination did contribute to the swelling and acute inflammation of both knees but it is certainly possible as this has seemed to self resolve rather quickly.  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 -Given home exercises isometric knee extensions and leg raises VMO abductor strengthening exercises as well.  Given the option of using patellar tendon straps in both knees.   -Voltaren gel as needed for pain up to 4 times per day - follow-up in 4 to 6 weeks

## 2020-10-28 NOTE — Telephone Encounter (Signed)
-----   Message from Garnet Sierras, DO sent at 10/27/2020  4:49 PM EST ----- Regarding: RE: dosage Let's start with 600mg /kg. So 20gm biweekly for now.   Thank you. ----- Message ----- From: Carin Hock, CMA Sent: 10/27/2020   4:19 PM EST To: Garnet Sierras, DO Subject: dosage                                         Dr Maudie Mercury, I have patient approved for Cuvitru.  She is approved for up to 800mg /kg but I didn't know what dose you want to start her at.  If you want 600mg /kg it would be 40 grams monthly just advise dose and frequency and I will submit her to pharmacy and reach out to patient. Tishina Lown ----- Message ----- From: Garnet Sierras, DO Sent: 10/22/2020   1:55 PM EST To: Carin Hock, CMA  I want to start this patient on SQ IgG replacement therapy - either cuvitru or hyquvia. Can you tell me which one would be covered through her insurance? Thank you.

## 2020-10-28 NOTE — Telephone Encounter (Signed)
L/m for patient to contact me to discuss starting Cuvitru infusions

## 2020-10-29 NOTE — Telephone Encounter (Signed)
Spoke to patient and advised approval for Cuvitru and submit to [facility] for fill and Onepath for copay/patient assistance

## 2020-11-14 ENCOUNTER — Encounter: Payer: Self-pay | Admitting: Internal Medicine

## 2020-12-19 ENCOUNTER — Encounter: Payer: 59 | Admitting: Internal Medicine

## 2020-12-22 ENCOUNTER — Encounter: Payer: 59 | Admitting: Internal Medicine

## 2021-01-20 NOTE — Telephone Encounter (Signed)
error 

## 2021-01-27 NOTE — Progress Notes (Signed)
Follow Up Note  RE: Leslie Reed MRN: [id number] DOB: March 22, 1946 Date of Office Visit: 01/28/2021  Referring provider: Burnis Medin, MD Primary care provider: Burnis Medin, MD  Chief Complaint: Other (Would like to talk to you about/Cuvitru infusion/)  History of Present Illness: I had the pleasure of seeing Leslie Reed for a follow up visit at the [facility] on 01/29/2021. She is a 75 y.o. female, who is being followed for hypogammaglobulinemia, bronchiectasis and chronic rhinitis. Her previous allergy office visit was on 10/22/2020 with Dr. Maudie Mercury. Today is a regular follow up visit.  Hypogammaglobulinemia  Started cuvitru SQ every 2 weeks in January 2022 - with 2 infusion sites no reactions.   She has been having issues with sleeping in the past but now she's sleeping more but still feeling tired. She has been working less and doing less physical activity and noted less stamina.   Patient has been having headaches 3-4 days after the infusion which lasts for a few days at a time.  She also had issues with her CPAP machine specifically with the headgear which seems to cause pressure on certain points on her had and jaw - possibly contributing to her headaches. At times she feels like her "eye is pulling."  No infections or antibiotics since the last visit.  Patient wants to know what vaccines she needs to get. She got 2 doses of Moderna and a booster Moderna on 07/14/2020.   No fevers/chills. No lymphadenopathy, easy bruising.  Currently on vitamin D supplements.   Bronchiectasis without complication Still having some coughing. Using flutter valve twice a day. No recent pulmonology visit.   Chronic rhinitis Patient was using azelastine and Flonase with good benefit and noticed that since starting the infusions they are not as effective but also her bad season is usually in the spring.   Assessment and Plan: Leslie Reed is a 75 y.o. female  with: Hypogammaglobulinemia ([place]) Past history - Patient referred by pulmonology for low IgG and IgM. History of frequent upper respiratory infections, mild central bronchiectasis, mother who passed away from IPF. Up to date with COVID-19 vaccine, flu, pneumovax and prevnar.  2021 bloodwork diptheria, tetanus and s. Pneumonia titers adequate. Alpha 1 level and total complement level normal. No history of meningitis or blood clots.  Interim history -  Start Cuvitro in January with no major issues (at home). Noticed some increased fatigue and headaches. No infections or antibiotics use.  Continue subcutaneous IgG (SCIG) $RemoveBe'600mg'[id number]$ /kg replacement therapy every 2 weeks with Cuvitru at home.  Monitor symptoms. Keep hydrated.  Keep track of infections. . Get bloodwork - CBC diff, IgG, IgM, IgA, ESR, CMP every 3 months - may need to adjust dosing depending on results.  . Discussed with patient that SCIG provides patients with passive immunization to the more common vaccines such as pneumovax, tetanus, shingles. No need to get these vaccines while on SCIG. However, I do recommend that patients get annual flu vaccine and now the COVID-19 vaccines. Levan Hurst booster vaccine given today. . The headaches maybe due to her CPAP machine and headgear - follow up with sleep medicine. . If no improvement, then will discuss switching the SCIG product (?Xembify).  Bronchiectasis without complication (HCC) Coughing at times. Using flutter valve twice a day. No recent pulmonology visit.   Today's spirometry was normal.  Follow up with pulmonology and their recommendations.  Continue to use the flutter valve daily.  Chronic rhinitis Past history -  Perennial rhinitis symptoms and using Vick's vapor rub. 2021 skin testing was negative to environmental allergies.  Interim history - increased symptoms. May use fluticasone 1 spray per nostril every other day for nasal congestion.  May use azelastine nasal spray 1-2  sprays per nostril twice a day as needed for runny nose/drainage.   Nasal saline spray (i.e., Simply Saline) or nasal saline lavage (i.e., NeilMed) is recommended as needed and prior to medicated nasal sprays.  Return in about 3 months (around 04/30/2021).  Lab Orders     CBC with Differential/Platelet     IgG, IgA, IgM     Sedimentation rate     C-reactive protein     Comprehensive metabolic panel  Diagnostics: Spirometry:  Tracings reviewed. Her effort: Good reproducible efforts. FVC: 2.88L FEV1: 2.39L, 109% predicted FEV1/FVC ratio: 83% Interpretation: Spirometry consistent with normal pattern.  Please see scanned spirometry results for details.  Medication List:  Current Outpatient Medications  Medication Sig Dispense Refill  . Acetaminophen (TYLENOL PO) Take by mouth as needed (prior to infusion).    Marland Kitchen azelastine (ASTELIN) 0.1 % nasal spray Place 2 sprays into both nostrils 2 (two) times daily. (Patient taking differently: Place 1 spray into both nostrils 2 (two) times daily.) 30 mL 5  . diphenhydrAMINE (BENADRYL) 50 MG capsule Take 50 mg by mouth every 6 (six) hours as needed. Prior to infusion    . fluticasone (FLONASE) 50 MCG/ACT nasal spray Place 1 spray into both nostrils daily.    . Minoxidil (ROGAINE EX) Apply topically.    Marland Kitchen Respiratory Therapy Supplies (FLUTTER) DEVI 1 each by Does not apply route 2 (two) times daily. 1 each 0  . rosuvastatin (CRESTOR) 10 MG tablet TAKE 1 TABLET BY MOUTH DAILY 90 tablet 1  . SALINE NASAL SPRAY NA Place into the nose.    . Vitamin D, Ergocalciferol, (DRISDOL) 1.25 MG (50000 UNIT) CAPS capsule Take 1 capsule (50,000 Units total) by mouth every 14 (fourteen) days. 6 capsule 4  . BIOTIN PO Take by mouth. (Patient not taking: Reported on 10/22/2020)     No current facility-administered medications for this visit.   Allergies: No Known Allergies I reviewed her past medical history, social history, family history, and environmental  history and no significant changes have been reported from her previous visit.  Review of Systems  Constitutional: Negative for appetite change, chills, fever and unexpected weight change.  HENT: Positive for postnasal drip. Negative for congestion, ear pain and rhinorrhea.   Eyes: Negative for itching.  Respiratory: Positive for cough. Negative for chest tightness, shortness of breath and wheezing.   Cardiovascular: Negative for chest pain.  Gastrointestinal: Negative for abdominal pain.  Genitourinary: Negative for difficulty urinating.  Musculoskeletal: Positive for arthralgias.  Allergic/Immunologic: Negative for environmental allergies and food allergies.  Neurological: Positive for headaches.   Objective: BP 124/70   Pulse 83   Temp 98.5 F (36.9 C) (Temporal)   Resp 16   LMP 11/29/1986   SpO2 97%  There is no height or weight on file to calculate BMI. Physical Exam Vitals and nursing note reviewed.  Constitutional:      Appearance: Normal appearance. She is well-developed.  HENT:     Head: Normocephalic and atraumatic.     Right Ear: Tympanic membrane and external ear normal.     Left Ear: Tympanic membrane and external ear normal.     Nose: Nose normal. No congestion or rhinorrhea.     Mouth/Throat:     Mouth:  Mucous membranes are moist.     Pharynx: Oropharynx is clear.  Eyes:     Conjunctiva/sclera: Conjunctivae normal.  Cardiovascular:     Rate and Rhythm: Normal rate and regular rhythm.     Heart sounds: Normal heart sounds. No murmur heard. No friction rub. No gallop.   Pulmonary:     Effort: Pulmonary effort is normal.     Breath sounds: Normal breath sounds. No wheezing, rhonchi or rales.  Musculoskeletal:     Cervical back: Neck supple.  Skin:    General: Skin is warm.  Neurological:     Mental Status: She is alert and oriented to person, place, and time.  Psychiatric:        Mood and Affect: Mood normal.        Behavior: Behavior normal.     Previous notes and tests were reviewed. The plan was reviewed with the patient/family, and all questions/concerned were addressed.  It was my pleasure to see Leslie Reed today and participate in her care. Please feel free to contact me with any questions or concerns.  Sincerely,  Rexene Alberts, DO Allergy & Immunology  [facility] office: [place] office: [phone]

## 2021-01-28 ENCOUNTER — Ambulatory Visit: Payer: 59 | Admitting: Allergy

## 2021-01-28 ENCOUNTER — Ambulatory Visit (INDEPENDENT_AMBULATORY_CARE_PROVIDER_SITE_OTHER): Payer: 59 | Admitting: *Deleted

## 2021-01-28 ENCOUNTER — Encounter: Payer: Self-pay | Admitting: Allergy

## 2021-01-28 ENCOUNTER — Other Ambulatory Visit: Payer: Self-pay

## 2021-01-28 ENCOUNTER — Ambulatory Visit: Payer: Self-pay | Admitting: *Deleted

## 2021-01-28 VITALS — BP 124/70 | HR 83 | Temp 98.5°F | Resp 16

## 2021-01-28 DIAGNOSIS — D801 Nonfamilial hypogammaglobulinemia: Secondary | ICD-10-CM | POA: Diagnosis not present

## 2021-01-28 DIAGNOSIS — J310 Chronic rhinitis: Secondary | ICD-10-CM | POA: Diagnosis not present

## 2021-01-28 DIAGNOSIS — J479 Bronchiectasis, uncomplicated: Secondary | ICD-10-CM

## 2021-01-28 DIAGNOSIS — Z23 Encounter for immunization: Secondary | ICD-10-CM

## 2021-01-28 NOTE — Patient Instructions (Addendum)
Low IgG:   Continue subcutaneous IgG replacement therapy every 2 weeks with Cuvitru at home.  Monitor symptoms. Keep hydrated.  Keep track of infections. . Get bloodwork:  o We are ordering labs, so please allow 1-2 weeks for the results to come back. o With the newly implemented Cures Act, the labs might be visible to you at the same time that they become visible to me. However, I will not address the results until all of the results are back, so please be patient.   May use fluticasone 1 spray per nostril every other day for nasal congesiton.  May use azelastine nasal spray 1-2 sprays per nostril twice a day as needed for runny nose/drainage.   Nasal saline spray (i.e., Simply Saline) or nasal saline lavage (i.e., NeilMed) is recommended as needed and prior to medicated nasal sprays.   Follow up with pulmonology and their recommendations.  Continue to use the flutter valve daily.  Follow up in 3 months or sooner if needed.  Follow up with sleep medicine regarding the CPAP.  You received [employer] today.   Buffered Isotonic Saline Irrigations:  Goal: . When you irrigate with the isotonic saline (salt water) it washes mucous and other debris from your nose that could be contributing to your nasal symptoms.   Recipe: Marland Kitchen Obtain 1 quart jar that is clean . Fill with clean (bottled, boiled or distilled) water . Add 1-2 heaping teaspoons of salt without iodine o If the solution with 2 teaspoons of salt is too strong, adjust the amount down until better tolerated . Add 1 teaspoon of [employer] baking soda (pure bicarbonate) . Mix ingredients together and store at room temperature and discard after 1 week * Alternatively you can buy pre made salt packets for the NeilMed bottle or there          are other over the counter brands available  Instructions: . Warm  cup of the solution in the microwave if desired but be careful not to overheat as this will burn the inside of your  nose . Stand over a sink (or do it while you shower) and squirt the solution into one side of your nose aiming towards the back of your head o Sometimes saying "coca cola" while irrigating can be helpful to prevent fluid from going down your throat  . The solution will travel to the back of your nose and then come out the other side . Perform this again on the other side . Try to do this twice a day . If you are using a nasal spray in addition to the irrigation, irrigate first and then use the topical nasal spray otherwise you will wash the nasal spray out of your nose

## 2021-01-28 NOTE — Progress Notes (Signed)
   Covid-19 Vaccination Clinic  Name:  BROOKSIE ELLWANGER    MRN: [id number] DOB: 06-Sep-1946  01/28/2021  Ms. Degrasse was observed post Covid-19 immunization for 15 minutes without incident. She was provided with Vaccine Information Sheet and instruction to access the V-Safe system.   Ms. Callejo was instructed to call 911 with any severe reactions post vaccine: Marland Kitchen Difficulty breathing  . Swelling of face and throat  . A fast heartbeat  . A bad rash all over body  . Dizziness and weakness

## 2021-01-29 ENCOUNTER — Encounter: Payer: Self-pay | Admitting: Allergy

## 2021-01-29 LAB — CBC WITH DIFFERENTIAL/PLATELET
Basophils Absolute: 0 10*3/uL (ref 0.0–0.2)
Basos: 1 %
EOS (ABSOLUTE): 0 10*3/uL (ref 0.0–0.4)
Eos: 0 %
Hematocrit: 40 % (ref 34.0–46.6)
Hemoglobin: 13.8 g/dL (ref 11.1–15.9)
Immature Grans (Abs): 0 10*3/uL (ref 0.0–0.1)
Immature Granulocytes: 0 %
Lymphocytes Absolute: 1.5 10*3/uL (ref 0.7–3.1)
Lymphs: 30 %
MCH: 31.3 pg (ref 26.6–33.0)
MCHC: 34.5 g/dL (ref 31.5–35.7)
MCV: 91 fL (ref 79–97)
Monocytes Absolute: 0.5 10*3/uL (ref 0.1–0.9)
Monocytes: 11 %
Neutrophils Absolute: 2.9 10*3/uL (ref 1.4–7.0)
Neutrophils: 58 %
Platelets: 293 10*3/uL (ref 150–450)
RBC: 4.41 x10E6/uL (ref 3.77–5.28)
RDW: 12.9 % (ref 11.7–15.4)
WBC: 4.9 10*3/uL (ref 3.4–10.8)

## 2021-01-29 LAB — COMPREHENSIVE METABOLIC PANEL
ALT: 23 IU/L (ref 0–32)
AST: 20 IU/L (ref 0–40)
Albumin/Globulin Ratio: 2 (ref 1.2–2.2)
Albumin: 4.5 g/dL (ref 3.7–4.7)
Alkaline Phosphatase: 74 IU/L (ref 44–121)
BUN/Creatinine Ratio: 22 (ref 12–28)
BUN: 15 mg/dL (ref 8–27)
Bilirubin Total: 0.3 mg/dL (ref 0.0–1.2)
CO2: 20 mmol/L (ref 20–29)
Calcium: 9.1 mg/dL (ref 8.7–10.3)
Chloride: 105 mmol/L (ref 96–106)
Creatinine, Ser: 0.67 mg/dL (ref 0.57–1.00)
Globulin, Total: 2.3 g/dL (ref 1.5–4.5)
Glucose: 119 mg/dL — ABNORMAL HIGH (ref 65–99)
Potassium: 4.3 mmol/L (ref 3.5–5.2)
Sodium: 142 mmol/L (ref 134–144)
Total Protein: 6.8 g/dL (ref 6.0–8.5)
eGFR: 92 mL/min/{1.73_m2} (ref >59)

## 2021-01-29 LAB — IGG, IGA, IGM
IgA/Immunoglobulin A, Serum: 77 mg/dL (ref 64–422)
IgG (Immunoglobin G), Serum: 1107 mg/dL (ref 586–1602)
IgM (Immunoglobulin M), Srm: 28 mg/dL (ref 26–217)

## 2021-01-29 LAB — SEDIMENTATION RATE: Sed Rate: 8 mm/hr (ref 0–40)

## 2021-01-29 LAB — C-REACTIVE PROTEIN: CRP: <1 mg/L (ref 0–10)

## 2021-01-29 NOTE — Assessment & Plan Note (Signed)
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 History of frequent upper respiratory infections, mild central bronchiectasis, mother who passed away from IPF. Up to date with COVID-19 vaccine, flu, pneumovax and prevnar.  2021 bloodwork diptheria, tetanus and s. Pneumonia titers adequate. Alpha 1 level and total complement level normal. No history of meningitis or blood clots.  Interim history -  Start Cuvitro in January with no major issues (at home). Noticed some increased fatigue and headaches. No infections or antibiotics use.  Continue subcutaneous IgG (SCIG) 600mg/kg replacement therapy every 2 weeks with Cuvitru at home.  Monitor symptoms. Keep hydrated.  Keep track of infections. . Get bloodwork - CBC diff, IgG, IgM, IgA, ESR, CMP every 3 months - may need to adjust dosing depending on results.  . Discussed with patient that SCIG provides patients with passive immunization to the more common vaccines such as pneumovax, tetanus, shingles. No need to get these vaccines while on SCIG. However, I do recommend that patients get annual flu vaccine and now the COVID-19 vaccines. . Moderna booster vaccine given today. . The headaches maybe due to her CPAP machine and headgear - follow up with sleep medicine. . If no improvement, then will discuss switching the SCIG product (?Xembify). 

## 2021-01-29 NOTE — Assessment & Plan Note (Addendum)
Coughing at times. Using flutter valve twice a day. No recent pulmonology visit.   Today's spirometry was normal.  Follow up with pulmonology and their recommendations.  Continue to use the flutter valve daily.

## 2021-01-29 NOTE — Assessment & Plan Note (Signed)
Past history - Perennial rhinitis symptoms and using Vick's vapor rub. 2021 skin testing was negative to environmental allergies.  Interim history - increased symptoms. May use fluticasone 1 spray per nostril every other day for nasal congestion.  May use azelastine nasal spray 1-2 sprays per nostril twice a day as needed for runny nose/drainage.   Nasal saline spray (i.e., Simply Saline) or nasal saline lavage (i.e., NeilMed) is recommended as needed and prior to medicated nasal sprays.

## 2021-02-10 ENCOUNTER — Other Ambulatory Visit: Payer: Self-pay

## 2021-02-10 ENCOUNTER — Ambulatory Visit (INDEPENDENT_AMBULATORY_CARE_PROVIDER_SITE_OTHER): Payer: 59 | Admitting: Internal Medicine

## 2021-02-10 ENCOUNTER — Encounter: Payer: Self-pay | Admitting: Internal Medicine

## 2021-02-10 VITALS — BP 136/80 | HR 75 | Temp 98.3°F | Ht 64.75 in | Wt 151.0 lb

## 2021-02-10 DIAGNOSIS — D801 Nonfamilial hypogammaglobulinemia: Secondary | ICD-10-CM | POA: Diagnosis not present

## 2021-02-10 DIAGNOSIS — G4733 Obstructive sleep apnea (adult) (pediatric): Secondary | ICD-10-CM

## 2021-02-10 DIAGNOSIS — R739 Hyperglycemia, unspecified: Secondary | ICD-10-CM | POA: Diagnosis not present

## 2021-02-10 DIAGNOSIS — E559 Vitamin D deficiency, unspecified: Secondary | ICD-10-CM | POA: Diagnosis not present

## 2021-02-10 DIAGNOSIS — Z0000 Encounter for general adult medical examination without abnormal findings: Secondary | ICD-10-CM | POA: Diagnosis not present

## 2021-02-10 DIAGNOSIS — Z79899 Other long term (current) drug therapy: Secondary | ICD-10-CM | POA: Diagnosis not present

## 2021-02-10 DIAGNOSIS — Z9989 Dependence on other enabling machines and devices: Secondary | ICD-10-CM

## 2021-02-10 DIAGNOSIS — E785 Hyperlipidemia, unspecified: Secondary | ICD-10-CM

## 2021-02-10 LAB — VITAMIN D 25 HYDROXY (VIT D DEFICIENCY, FRACTURES): VITD: 40.43 ng/mL (ref 30.00–100.00)

## 2021-02-10 LAB — BASIC METABOLIC PANEL
BUN: 19 mg/dL (ref 6–23)
CO2: 27 mEq/L (ref 19–32)
Calcium: 9.6 mg/dL (ref 8.4–10.5)
Chloride: 104 mEq/L (ref 96–112)
Creatinine, Ser: 0.74 mg/dL (ref 0.40–1.20)
GFR: 80 mL/min (ref >60.00)
Glucose, Bld: 84 mg/dL (ref 70–99)
Potassium: 4.5 mEq/L (ref 3.5–5.1)
Sodium: 139 mEq/L (ref 135–145)

## 2021-02-10 LAB — LIPID PANEL
Cholesterol: 146 mg/dL (ref 0–200)
HDL: 48.7 mg/dL (ref >39.00)
LDL Cholesterol: 80 mg/dL (ref 0–99)
NonHDL: 97.53
Total CHOL/HDL Ratio: 3
Triglycerides: 89 mg/dL (ref 0.0–149.0)
VLDL: 17.8 mg/dL (ref 0.0–40.0)

## 2021-02-10 LAB — HEMOGLOBIN A1C: Hgb A1c MFr Bld: 5.5 % (ref 4.6–6.5)

## 2021-02-10 NOTE — Progress Notes (Signed)
Chief Complaint  Patient presents with  . Annual Exam    HPI: Leslie Reed 75 y.o. comes in today for Preventive Medicare exam/ wellness visit .Since last visit. 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 HLD she is back on the Crestor but is certain that it is causing a funny smell odor even in her urine or feces smells like sweetness and is annoying when she takes the medicine and days off she does not smell it she has been taking medication every other day.  Crestor.  Sees  Hemeimmunology  pulm allergy gyne  Has OSA  Hypogammaglobinemia  Began on   Igg.   Injections   Infusions   on nasal sprays .  Help not as much in spring.  Has been tested and is not allergic Had an elevated sugar at last labs but she was not fasting. Is taking high-dose vitamin D was on the low side last time checked. Still thinks her memory is often declining a bit has follow-up with neurology about her sleep apnea but has not had a reassessment of memory. Using topical Voltaren about once a month Asks about using topical clobetasol ointment every few weeks on a very itchy spot on her right leg no regular use. Health Maintenance  Topic Date Due  . COVID-19 Vaccine (4 - Booster) 07/31/2021  . MAMMOGRAM  08/14/2022  . TETANUS/TDAP  04/19/2023  . COLONOSCOPY (Pts 45-47yrs Insurance coverage will need to be confirmed)  09/09/2026  . INFLUENZA VACCINE  Completed  . DEXA SCAN  Completed  . Hepatitis C Screening  Completed  . PNA vac Low Risk Adult  Completed  . HPV VACCINES  Aged Out   Health Maintenance Review LIFESTYLE:  Exercise:   Tobacco/ETS: Alcohol:  Sugar  beverages: Sleep:osa  Drug use: no HH:3 Back to physical activity and some work.  Hearing:  Ok   Vision:  No limitations at present . Last eye check UTD  Safety:  Has smoke detector and wears seat belts. . No excess sun exposure. Sees dentist regularly.  Nutrition: Eats well balanced diet; adequate calcium and vitamin D. No swallowing chewing problems.  Injury: no major injuries in the last six months.  Other healthcare providers:  Reviewed today .   Preventive parameters: up-to-date  Reviewed   ADLS:   There are no problems or need for assistance  driving, feeding, obtaining food, dressing, toileting and bathing, managing money using phone. She is independent.   ROS:  GEN/ HEENT: No fever, significant weight changes sweats headaches vision problems hearing changes, CV/ PULM; No chest pain shortness of breath cough, syncope,edema  change in exercise tolerance. GI /GU: No adominal pain, vomiting, change in bowel habits. No blood in the stool. No significant GU symptoms. SKIN/HEME: ,no acute skin rashes suspicious lesions or bleeding. No lymphadenopathy, nodules, masses.  NEURO/ PSYCH:  No neurologic signs such as weakness numbness. No depression anxiety. IMM/ Allergy: No unusual infections.  Allergy .   REST of 12 system review negative except as per HPI   Past Medical History:  Diagnosis Date  . Allergic rhinitis   . Allergy   . Arthritis  hands, lower back  . Asthma   . Bronchitis    uses inhaler prn  . Cataract    just being monitored - no surgery  . Colitis 8/04  . Diverticulitis   . GERD (gastroesophageal reflux disease)    diet controlled, no meds  . History of migraine headaches   . Hx of colonic polyp   . Hx of varicella   . Hyperlipidemia   . Insomnia   . 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 Mitral valve prolapse   . Osteopenia   . Scoliosis 2005  . Sleep apnea   . Tick bite 2014   STARI-antibiotics x 5 weeks  . Tracheobronchopathia-osteochondroplastica      Family History  Problem Relation Age of Onset  . Diabetes Mother   . Pulmonary fibrosis Mother        9 deceased  . Hypertension Mother   . Hypertension Father   . Alzheimer's disease Father   . Kidney failure Father   . Diabetes Maternal Grandmother   . Cancer Maternal Grandfather        ??  . Hypertension Brother   . Diabetes Sister   . Diabetes Son   . Allergy (severe) Son   . Depression Son   . Thyroid disease Daughter   . Psoriasis Daughter   . Thyroid disease Daughter   . Colon cancer Neg Hx   . Esophageal cancer Neg Hx   . Rectal cancer Neg Hx   . Stomach cancer Neg Hx     Social History   Socioeconomic History  . Marital status: Married    Spouse name: Not on file  . Number of children: Not on file  . Years of education: Not on file  . Highest education level: Not on file  Occupational History  . Not on file  Tobacco Use  . Smoking status: Passive Smoke Exposure - Never Smoker  . Smokeless tobacco: Never Used  Vaping Use  . Vaping Use: Never used  Substance and Sexual Activity  . Alcohol use: Yes    Comment: occ  . Drug use: No  . Sexual activity: Not Currently    Partners: Male    Birth control/protection: Surgical, Post-menopausal    Comment: TAH  Other Topics Concern  . 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 Acetaminophen (TYLENOL PO) Take by mouth as needed (prior to infusion).  Marland Kitchen azelastine (ASTELIN) 0.1 % nasal spray  Place 2 sprays into both nostrils 2 (two) times daily. (Patient taking differently: Place 1 spray into both nostrils 2 (two) times daily.)  . diphenhydrAMINE (BENADRYL) 50 MG capsule Take 50 mg by mouth every 6 (six) hours as needed. Prior to infusion  . fluticasone (FLONASE) 50 MCG/ACT nasal spray Place 1 spray into both nostrils daily.  . Minoxidil (ROGAINE EX) Apply topically.  Marland Kitchen Respiratory Therapy Supplies (  FLUTTER) DEVI 1 each by Does not apply route 2 (two) times daily.  . rosuvastatin (CRESTOR) 10 MG tablet TAKE 1 TABLET BY MOUTH DAILY  . SALINE NASAL SPRAY NA Place into the nose.  . Vitamin D, Ergocalciferol, (DRISDOL) 1.25 MG (50000 UNIT) CAPS capsule Take 1 capsule (50,000 Units total) by mouth every 14 (fourteen) days.  . [DISCONTINUED] BIOTIN PO Take by mouth. (Patient not taking: Reported on 10/22/2020)   No facility-administered encounter medications on file as of 02/10/2021.    EXAM:  BP 136/80 (BP Location: Left Arm, Patient Position: Sitting, Cuff Size: Normal)   Pulse 75   Temp 98.3 F (36.8 C) (Oral)   Ht 5' 4.75" (1.645 m)   Wt 151 lb (68.5 kg)   LMP 11/29/1986   SpO2 95%   BMI 25.32 kg/m   Body mass index is 25.32 kg/m.  Physical Exam: Vital signs reviewed [id number] is a well-developed well-nourished alert cooperative   who appears stated age in no acute distress.  HEENT: normocephalic atraumatic , Eyes: PERRL EOM's full, conjunctiva clear, Nares: paten,t no deformity discharge or tenderness., Ears: no deformity EAC's clear TMs with normal landmarks. Mouth:masked NECK: supple without masses, thyromegaly or bruits. CHEST/PULM:  Clear to auscultation and percussion breath sounds equal no wheeze , rales or rhonchi. No chest wall deformities or tenderness. CV: PMI is nondisplaced, S1 S2 no gallops, murmurs, rubs. Peripheral pulses are full without delay.No JVD .  ABDOMEN: Bowel sounds normal nontender  No guard or rebound, no hepato splenomegal no CVA tenderness.    Extremtities:  No clubbing cyanosis or edema, no acute joint swelling or redness no focal atrophy NEURO:  Oriented x3, cranial nerves 3-12 appear to be intact, no obvious focal weakness,gait within normal limits no abnormal reflexes or asymmetrical SKIN: No acute rashes normal turgor, color, no bruising or petechiae. PSYCH: Oriented, good eye contact, no obvious depression anxiety, cognition and judgment appear normal. LN: no cervical axillary inguinal adenopathy No noted deficits in memory, attention, and speech.   Lab Results  Component Value Date   WBC 4.9 01/28/2021   HGB 13.8 01/28/2021   HCT 40.0 01/28/2021   PLT 293 01/28/2021   GLUCOSE 84 02/10/2021   CHOL 146 02/10/2021   TRIG 89.0 02/10/2021   HDL 48.70 02/10/2021   LDLDIRECT 138.0 10/13/2011   LDLCALC 80 02/10/2021   ALT 23 01/28/2021   AST 20 01/28/2021   NA 139 02/10/2021   K 4.5 02/10/2021   CL 104 02/10/2021   CREATININE 0.74 02/10/2021   BUN 19 02/10/2021   CO2 27 02/10/2021   TSH 0.96 12/18/2019   HGBA1C 5.5 02/10/2021    ASSESSMENT AND PLAN:  Discussed the following assessment and plan:  Visit for preventive health examination  Medication management - Plan: Lipid panel, Basic metabolic panel, Hemoglobin A1c, VITAMIN D 25 Hydroxy (Vit-D Deficiency, Fractures), VITAMIN D 25 Hydroxy (Vit-D Deficiency, Fractures), Lipid panel, Basic metabolic panel, Hemoglobin A1c  Hyperlipidemia, unspecified hyperlipidemia type - Feels Crestor causes a sweet odor and body secretions and smell - Plan: Lipid panel, Basic metabolic panel, Hemoglobin A1c, Lipid panel, Basic metabolic panel, Hemoglobin A1c  Hypogammaglobulinemia (HCC) - Plan: Lipid panel, Basic metabolic panel, Hemoglobin A1c, Lipid panel, Basic metabolic panel, Hemoglobin A1c  Obstructive sleep apnea treated with continuous positive airway pressure (CPAP) - Getting headaches at pressure point of apparatus  Elevated blood sugar - Nonfasting get fasting BG and  A1c - Plan: Lipid panel, Basic metabolic panel, Hemoglobin A1c, Lipid panel, Basic  metabolic panel, Hemoglobin A1c  Vitamin D deficiency - Plan: VITAMIN D 25 Hydroxy (Vit-D Deficiency, Fractures), VITAMIN D 25 Hydroxy (Vit-D Deficiency, Fractures) Due for monitoring labs   For hld and will update blood sugar. Unusual odor she notices and relates to the Crestor. I am not aware of this can check with our pharmacist We could consider trying a different statin medicine.  Such as atorvastatin.  Will await lab results. Regard to her memory advise she make an appointment with [facility] neurology as follow-up about memory for reassessment.  Let us know if we need to be of assistance. Certainly an adequate treatment for her sleep apnea could add to the problem and perhaps a comfort change in her apparatus would help be compliant. Patient Care Team: Markel Kurtenbach, Standley Brooking, MD as PCP - General (Internal Medicine) Stefanie Libel, MD (Family Medicine) Megan Salon, MD (Gynecology) Dohmeier, Asencion Partridge, MD (Neurology) Garnet Sierras, DO as Consulting Physician (Allergy)  Patient Instructions   Checking lab today and then decide if changing med is helpful. We can also ask our pharmacist opinion.    Continue lifestyle intervention healthy eating and exercise .   Plan fu depending on results .    Health Maintenance, Female Adopting a healthy lifestyle and getting preventive care are important in promoting health and wellness. Ask your health care provider about:  The right schedule for you to have regular tests and exams.  Things you can do on your own to prevent diseases and keep yourself healthy. What should I know about diet, weight, and exercise? Eat a healthy diet  Eat a diet that includes plenty of vegetables, fruits, low-fat dairy products, and lean protein.  Do not eat a lot of foods that are high in solid fats, added sugars, or sodium.   Maintain a healthy weight Body mass index (BMI) is used to  identify weight problems. It estimates body fat based on height and weight. Your health care provider can help determine your BMI and help you achieve or maintain a healthy weight. Get regular exercise Get regular exercise. This is one of the most important things you can do for your health. Most adults should:  Exercise for at least 150 minutes each week. The exercise should increase your heart rate and make you sweat (moderate-intensity exercise).  Do strengthening exercises at least twice a week. This is in addition to the moderate-intensity exercise.  Spend less time sitting. Even light physical activity can be beneficial. Watch cholesterol and blood lipids Have your blood tested for lipids and cholesterol at 74 years of age, then have this test every 5 years. Have your cholesterol levels checked more often if:  Your lipid or cholesterol levels are high.  You are older than 74 years of age.  You are at high risk for heart disease. What should I know about cancer screening? Depending on your health history and family history, you may need to have cancer screening at various ages. This may include screening for:  Breast cancer.  Cervical cancer.  Colorectal cancer.  Skin cancer.  Lung cancer. What should I know about heart disease, diabetes, and high blood pressure? Blood pressure and heart disease  High blood pressure causes heart disease and increases the risk of stroke. This is more likely to develop in people who have high blood pressure readings, are of African descent, or are overweight.  Have your blood pressure checked: ? Every 3-5 years if you are 20-33 years of age. ? Every year if  you are 42 years old or older. Diabetes Have regular diabetes screenings. This checks your fasting blood sugar level. Have the screening done:  Once every three years after age 70 if you are at a normal weight and have a low risk for diabetes.  More often and at a younger age if you  are overweight or have a high risk for diabetes. What should I know about preventing infection? Hepatitis B If you have a higher risk for hepatitis B, you should be screened for this virus. Talk with your health care provider to find out if you are at risk for hepatitis B infection. Hepatitis C Testing is recommended for:  Everyone born from [birth year] through 1964.  Anyone with known risk factors for hepatitis C. Sexually transmitted infections (STIs)  Get screened for STIs, including gonorrhea and chlamydia, if: ? You are sexually active and are younger than 74 years of age. ? You are older than 74 years of age and your health care provider tells you that you are at risk for this type of infection. ? Your sexual activity has changed since you were last screened, and you are at increased risk for chlamydia or gonorrhea. Ask your health care provider if you are at risk.  Ask your health care provider about whether you are at high risk for HIV. Your health care provider may recommend a prescription medicine to help prevent HIV infection. If you choose to take medicine to prevent HIV, you should first get tested for HIV. You should then be tested every 3 months for as long as you are taking the medicine. Pregnancy  If you are about to stop having your period (premenopausal) and you may become pregnant, seek counseling before you get pregnant.  Take 400 to 800 micrograms (mcg) of folic acid every day if you become pregnant.  Ask for birth control (contraception) if you want to prevent pregnancy. Osteoporosis and menopause Osteoporosis is a disease in which the bones lose minerals and strength with aging. This can result in bone fractures. If you are 102 years old or older, or if you are at risk for osteoporosis and fractures, ask your health care provider if you should:  Be screened for bone loss.  Take a calcium or vitamin D supplement to lower your risk of fractures.  Be given hormone  replacement therapy (HRT) to treat symptoms of menopause. Follow these instructions at home: Lifestyle  Do not use any products that contain nicotine or tobacco, such as cigarettes, e-cigarettes, and chewing tobacco. If you need help quitting, ask your health care provider.  Do not use street drugs.  Do not share needles.  Ask your health care provider for help if you need support or information about quitting drugs. Alcohol use  Do not drink alcohol if: ? Your health care provider tells you not to drink. ? You are pregnant, may be pregnant, or are planning to become pregnant.  If you drink alcohol: ? Limit how much you use to 0-1 drink a day. ? Limit intake if you are breastfeeding.  Be aware of how much alcohol is in your drink. In the [place] one drink equals one 12 oz bottle of beer (355 mL), one 5 oz glass of wine (148 mL), or one 1 oz glass of hard liquor (44 mL). General instructions  Schedule regular health, dental, and eye exams.  Stay current with your vaccines.  Tell your health care provider if: ? You often feel depressed. ? You have ever  been abused or do not feel safe at home. Summary  Adopting a healthy lifestyle and getting preventive care are important in promoting health and wellness.  Follow your health care provider's instructions about healthy diet, exercising, and getting tested or screened for diseases.  Follow your health care provider's instructions on monitoring your cholesterol and blood pressure. This information is not intended to replace advice given to you by your health care provider. Make sure you discuss any questions you have with your health care provider. Document Revised: 11/08/2018 Document Reviewed: 11/08/2018 Elsevier Patient Education  2021 [place]. Katyra Tomassetti M.D.

## 2021-02-10 NOTE — Patient Instructions (Signed)
Checking lab today and then decide if changing med is helpful. We can also ask our pharmacist opinion.    Continue lifestyle intervention healthy eating and exercise .   Plan fu depending on results .    Health Maintenance, Female Adopting a healthy lifestyle and getting preventive care are important in promoting health and wellness. Ask your health care provider about:  The right schedule for you to have regular tests and exams.  Things you can do on your own to prevent diseases and keep yourself healthy. What should I know about diet, weight, and exercise? Eat a healthy diet  Eat a diet that includes plenty of vegetables, fruits, low-fat dairy products, and lean protein.  Do not eat a lot of foods that are high in solid fats, added sugars, or sodium.   Maintain a healthy weight Body mass index (BMI) is used to identify weight problems. It estimates body fat based on height and weight. Your health care provider can help determine your BMI and help you achieve or maintain a healthy weight. Get regular exercise Get regular exercise. This is one of the most important things you can do for your health. Most adults should:  Exercise for at least 150 minutes each week. The exercise should increase your heart rate and make you sweat (moderate-intensity exercise).  Do strengthening exercises at least twice a week. This is in addition to the moderate-intensity exercise.  Spend less time sitting. Even light physical activity can be beneficial. Watch cholesterol and blood lipids Have your blood tested for lipids and cholesterol at 74 years of age, then have this test every 5 years. Have your cholesterol levels checked more often if:  Your lipid or cholesterol levels are high.  You are older than 74 years of age.  You are at high risk for heart disease. What should I know about cancer screening? Depending on your health history and family history, you may need to have cancer screening  at various ages. This may include screening for:  Breast cancer.  Cervical cancer.  Colorectal cancer.  Skin cancer.  Lung cancer. What should I know about heart disease, diabetes, and high blood pressure? Blood pressure and heart disease  High blood pressure causes heart disease and increases the risk of stroke. This is more likely to develop in people who have high blood pressure readings, are of African descent, or are overweight.  Have your blood pressure checked: ? Every 3-5 years if you are 54-69 years of age. ? Every year if you are 6 years old or older. Diabetes Have regular diabetes screenings. This checks your fasting blood sugar level. Have the screening done:  Once every three years after age 28 if you are at a normal weight and have a low risk for diabetes.  More often and at a younger age if you are overweight or have a high risk for diabetes. What should I know about preventing infection? Hepatitis B If you have a higher risk for hepatitis B, you should be screened for this virus. Talk with your health care provider to find out if you are at risk for hepatitis B infection. Hepatitis C Testing is recommended for:  Everyone born from [birth year] through 1965.  Anyone with known risk factors for hepatitis C. Sexually transmitted infections (STIs)  Get screened for STIs, including gonorrhea and chlamydia, if: ? You are sexually active and are younger than 74 years of age. ? You are older than 74 years of age and your health  care provider tells you that you are at risk for this type of infection. ? Your sexual activity has changed since you were last screened, and you are at increased risk for chlamydia or gonorrhea. Ask your health care provider if you are at risk.  Ask your health care provider about whether you are at high risk for HIV. Your health care provider may recommend a prescription medicine to help prevent HIV infection. If you choose to take medicine to  prevent HIV, you should first get tested for HIV. You should then be tested every 3 months for as long as you are taking the medicine. Pregnancy  If you are about to stop having your period (premenopausal) and you may become pregnant, seek counseling before you get pregnant.  Take 400 to 800 micrograms (mcg) of folic acid every day if you become pregnant.  Ask for birth control (contraception) if you want to prevent pregnancy. Osteoporosis and menopause Osteoporosis is a disease in which the bones lose minerals and strength with aging. This can result in bone fractures. If you are 8 years old or older, or if you are at risk for osteoporosis and fractures, ask your health care provider if you should:  Be screened for bone loss.  Take a calcium or vitamin D supplement to lower your risk of fractures.  Be given hormone replacement therapy (HRT) to treat symptoms of menopause. Follow these instructions at home: Lifestyle  Do not use any products that contain nicotine or tobacco, such as cigarettes, e-cigarettes, and chewing tobacco. If you need help quitting, ask your health care provider.  Do not use street drugs.  Do not share needles.  Ask your health care provider for help if you need support or information about quitting drugs. Alcohol use  Do not drink alcohol if: ? Your health care provider tells you not to drink. ? You are pregnant, may be pregnant, or are planning to become pregnant.  If you drink alcohol: ? Limit how much you use to 0-1 drink a day. ? Limit intake if you are breastfeeding.  Be aware of how much alcohol is in your drink. In the [place] one drink equals one 12 oz bottle of beer (355 mL), one 5 oz glass of wine (148 mL), or one 1 oz glass of hard liquor (44 mL). General instructions  Schedule regular health, dental, and eye exams.  Stay current with your vaccines.  Tell your health care provider if: ? You often feel depressed. ? You have ever been  abused or do not feel safe at home. Summary  Adopting a healthy lifestyle and getting preventive care are important in promoting health and wellness.  Follow your health care provider's instructions about healthy diet, exercising, and getting tested or screened for diseases.  Follow your health care provider's instructions on monitoring your cholesterol and blood pressure. This information is not intended to replace advice given to you by your health care provider. Make sure you discuss any questions you have with your health care provider. Document Revised: 11/08/2018 Document Reviewed: 11/08/2018 Elsevier Patient Education  2021 [employer].

## 2021-02-24 ENCOUNTER — Other Ambulatory Visit: Payer: Self-pay | Admitting: Internal Medicine

## 2021-02-24 DIAGNOSIS — Z79899 Other long term (current) drug therapy: Secondary | ICD-10-CM

## 2021-02-24 DIAGNOSIS — E785 Hyperlipidemia, unspecified: Secondary | ICD-10-CM

## 2021-02-24 MED ORDER — ATORVASTATIN CALCIUM 20 MG PO TABS: 20.0000 mg | ORAL_TABLET | Freq: Every day | ORAL | 1 refills | Status: DC

## 2021-02-24 NOTE — Progress Notes (Signed)
So cholesterol is okay.  Do you want to try changing the Crestor to something like atorvastatin because of the smell or continue as you are doing

## 2021-02-24 NOTE — Progress Notes (Signed)
Okay we will send in atorvastatin 20 mg a day.  If not tolerated can decrease to every other day or 3 days a week I will put order in for a future lipid panel that she can do after she is made the change 4 to 6 months. Let us know if there is a problem with this plan.

## 2021-03-11 ENCOUNTER — Other Ambulatory Visit: Payer: Self-pay | Admitting: Allergy

## 2021-04-30 NOTE — Progress Notes (Signed)
Follow Up Note  RE: Leslie Reed MRN: [id number] DOB: 01/17/1946 Date of Office Visit: 05/01/2021  Referring provider: Burnis Medin, MD Primary care provider: Burnis Medin, MD  Chief Complaint: Other (Has been dealing with fatigue and her body/muscle aches. Possible fluid in her knees.Has been having difficulty getting things done at work and around the house. Headaches have gotten better. )  History of Present Illness: I had the pleasure of seeing Leslie Reed for a follow up visit at the [facility] [place] on 05/01/2021. She is a 75 y.o. 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 Her previous allergy office visit was on 01/28/2021 with Dr. Maudie Mercury. Today is a regular follow up visit.  Hypogammaglobulinemia ([place]) Last infusion was last week.  Currently on Cuvitro every 2 weeks at home with no issues. She noticed increased fatigue, muscle aches since starting. 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   She initially had headaches but now that resolved.  No bruising, bleeding, lymphadenopathy. No fevers/chills.   Denies any infections/antibiotics.  Patient uses CPAP machine a few hours Not getting good sleep.   Bronchiectasis without complication (HCC) Currently using flutter valve 1-2 times a day with unknown benefit.  No recent pulmonology visit.   Chronic rhinitis She is having some PND and coughing. Currently using Flonase and azelastine 1 spray per nostril twice a day which is not helping anymore.  No nosebleeds.   Assessment and Plan: Drew is a 75 y.o. female with: Hypogammaglobulinemia ([place]) Past history - Patient referred by pulmonology for low IgG and IgM. History of frequent upper respiratory infections, mild central bronchiectasis, mother who passed away from IPF. Up to date with COVID-19 vaccine, flu, pneumovax and prevnar.  2021 bloodwork diptheria,  tetanus and s. Pneumonia titers adequate. Alpha 1 level and total complement level normal. No history of meningitis or blood clots.  Interim history -  Started Cuvitro in January and noticed increased arthralgias and fatigue. No infections. Headaches resolved.  Hold infusions for now.   Will let you know dose adjustments based on bloodwork - either going every 4 weeks or halving the dose.   Not sure if the arthralgias and fatigue are from the infusions or not.   Keep track of infections. . Get bloodwork as below.  Bronchiectasis without complication (HCC) Coughing up phlegm. Uses flutter valve. No recent pulmonology visit.   Today's spirometry was normal.  Follow up with pulmonology and their recommendations.  Continue to use the flutter valve daily.  Chronic rhinitis Past history - Perennial rhinitis symptoms and using Vick's vapor rub. 2021 skin testing was negative to environmental allergies.  Interim history - increased PND symptoms. Flonase and azelastine not as helpful. Use Atrovent (ipratropium) 0.03% 1-2 sprays per nostril twice a day as needed for runny nose/drainage. Stop Flonase and azelastine.   Nasal saline spray (i.e., Simply Saline) or nasal saline lavage (i.e., NeilMed) is recommended as needed and prior to medicated nasal sprays.  Return in about 3 months (around 08/01/2021).  Meds ordered this encounter  Medications  . ipratropium (ATROVENT) 0.03 % nasal spray    Sig: Place 2 sprays into both nostrils 2 (two) times daily as needed (post nasal drip).    Dispense:  30 mL    Refill:  5    Lab Orders     CBC with Differential/Platelet     Comprehensive metabolic panel  C-reactive protein     Sedimentation rate     IgG, IgA, IgM  Diagnostics: Spirometry:  Tracings reviewed. Her effort: Good reproducible efforts. FVC: 2.82L FEV1: 2.35L, 109% predicted FEV1/FVC ratio: 83% Interpretation: Spirometry consistent with normal pattern.  Please see scanned  spirometry results for details.  Medication List:  Current Outpatient Medications  Medication Sig Dispense Refill  . Acetaminophen (TYLENOL PO) Take by mouth as needed (prior to infusion).    Marland Kitchen atorvastatin (LIPITOR) 20 MG tablet Take 1 tablet (20 mg total) by mouth daily. Instead of crestor 90 tablet 1  . diphenhydrAMINE (BENADRYL) 50 MG capsule Take 50 mg by mouth every 6 (six) hours as needed. Prior to infusion    . ipratropium (ATROVENT) 0.03 % nasal spray Place 2 sprays into both nostrils 2 (two) times daily as needed (post nasal drip). 30 mL 5  . Minoxidil (ROGAINE EX) Apply topically.    Marland Kitchen Respiratory Therapy Supplies (FLUTTER) DEVI 1 each by Does not apply route 2 (two) times daily. 1 each 0  . Vitamin D, Ergocalciferol, (DRISDOL) 1.25 MG (50000 UNIT) CAPS capsule Take 1 capsule (50,000 Units total) by mouth every 14 (fourteen) days. 6 capsule 4  . SALINE NASAL SPRAY NA Place into the nose. (Patient not taking: Reported on 05/01/2021)     No current facility-administered medications for this visit.   Allergies: No Known Allergies I reviewed her past medical history, social history, family history, and environmental history and no significant changes have been reported from her previous visit.  Review of Systems  Constitutional: Negative for appetite change, chills, fever and unexpected weight change.  HENT: Positive for postnasal drip. Negative for congestion, ear pain and rhinorrhea.   Eyes: Negative for itching.  Respiratory: Positive for cough. Negative for chest tightness, shortness of breath and wheezing.   Cardiovascular: Negative for chest pain.  Gastrointestinal: Negative for abdominal pain.  Genitourinary: Negative for difficulty urinating.  Musculoskeletal: Positive for arthralgias.  Allergic/Immunologic: Negative for environmental allergies and food allergies.  Neurological: Positive for headaches.   Objective: BP 118/78   Pulse 71   Temp 97.6 F (36.4 C)   Resp  16   Ht 5\' 4"  (1.626 m)   Wt 148 lb 3.2 oz (67.2 kg)   LMP 11/29/1986   SpO2 97%   BMI 25.44 kg/m  Body mass index is 25.44 kg/m. Physical Exam Vitals and nursing note reviewed.  Constitutional:      Appearance: Normal appearance. She is well-developed.  HENT:     Head: Normocephalic and atraumatic.     Right Ear: Tympanic membrane and external ear normal.     Left Ear: Tympanic membrane and external ear normal.     Nose: Nose normal. No congestion or rhinorrhea.     Mouth/Throat:     Mouth: Mucous membranes are moist.     Pharynx: Oropharynx is clear.  Eyes:     Conjunctiva/sclera: Conjunctivae normal.  Cardiovascular:     Rate and Rhythm: Normal rate and regular rhythm.     Heart sounds: Normal heart sounds. No murmur heard. No friction rub. No gallop.   Pulmonary:     Effort: Pulmonary effort is normal.     Breath sounds: Normal breath sounds. No wheezing, rhonchi or rales.  Musculoskeletal:     Cervical back: Neck supple.  Skin:    General: Skin is warm.  Neurological:     Mental Status: She is alert and oriented to person, place, and time.  Psychiatric:  Mood and Affect: Mood normal.        Behavior: Behavior normal.    Previous notes and tests were reviewed. The plan was reviewed with the patient/family, and all questions/concerned were addressed.  It was my pleasure to see Anyeli today and participate in her care. Please feel free to contact me with any questions or concerns.  Sincerely,  Rexene Alberts, DO Allergy & Immunology  [facility] office: [place] office: [phone]

## 2021-05-01 ENCOUNTER — Encounter: Payer: Self-pay | Admitting: Allergy

## 2021-05-01 ENCOUNTER — Other Ambulatory Visit: Payer: Self-pay

## 2021-05-01 ENCOUNTER — Ambulatory Visit (INDEPENDENT_AMBULATORY_CARE_PROVIDER_SITE_OTHER): Payer: 59 | Admitting: Allergy

## 2021-05-01 VITALS — BP 118/78 | HR 71 | Temp 97.6°F | Resp 16 | Ht 64.0 in | Wt 148.2 lb

## 2021-05-01 DIAGNOSIS — D801 Nonfamilial hypogammaglobulinemia: Secondary | ICD-10-CM | POA: Diagnosis not present

## 2021-05-01 DIAGNOSIS — J479 Bronchiectasis, uncomplicated: Secondary | ICD-10-CM

## 2021-05-01 DIAGNOSIS — J310 Chronic rhinitis: Secondary | ICD-10-CM | POA: Diagnosis not present

## 2021-05-01 MED ORDER — IPRATROPIUM BROMIDE 0.03 % NA SOLN: 2.0000 | Freq: Two times a day (BID) | NASAL | 5 refills | Status: DC | PRN

## 2021-05-01 NOTE — Assessment & Plan Note (Signed)
Coughing up phlegm. Uses flutter valve. No recent pulmonology visit.   Today's spirometry was normal.  Follow up with pulmonology and their recommendations.  Continue to use the flutter valve daily.

## 2021-05-01 NOTE — Assessment & Plan Note (Signed)
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 History of frequent upper respiratory infections, mild central bronchiectasis, mother who passed away from IPF. Up to date with COVID-19 vaccine, flu, pneumovax and prevnar.  2021 bloodwork diptheria, tetanus and s. Pneumonia titers adequate. Alpha 1 level and total complement level normal. No history of meningitis or blood clots.  Interim history -  Started Cuvitro in January and noticed increased arthralgias and fatigue. No infections. Headaches resolved.  Hold infusions for now.   Will let you know dose adjustments based on bloodwork - either going every 4 weeks or halving the dose.   Not sure if the arthralgias and fatigue are from the infusions or not.   Keep track of infections. . Get bloodwork as below.

## 2021-05-01 NOTE — Patient Instructions (Addendum)
Low IgG:   Hold infusions for now.   Will let you know dose adjustments based on bloodwork.   Keep track of infections. . Get bloodwork:  o We are ordering labs, so please allow 1-2 weeks for the results to come back. o With the newly implemented Cures Act, the labs might be visible to you at the same time that they become visible to me. However, I will not address the results until all of the results are back, so please be patient.   Use Atrovent (ipratropium) 0.03% 1-2 sprays per nostril twice a day as needed for runny nose/drainage. Stop Flonase and azelastine.   Nasal saline spray (i.e., Simply Saline) or nasal saline lavage (i.e., NeilMed) is recommended as needed and prior to medicated nasal sprays.   Follow up with pulmonology and their recommendations.  Continue to use the flutter valve daily.  Follow up in 3 months or sooner if needed.   Buffered Isotonic Saline Irrigations:  Goal: . When you irrigate with the isotonic saline (salt water) it washes mucous and other debris from your nose that could be contributing to your nasal symptoms.   Recipe: Marland Kitchen Obtain 1 quart jar that is clean . Fill with clean (bottled, boiled or distilled) water . Add 1-2 heaping teaspoons of salt without iodine o If the solution with 2 teaspoons of salt is too strong, adjust the amount down until better tolerated . Add 1 teaspoon of [employer] baking soda (pure bicarbonate) . Mix ingredients together and store at room temperature and discard after 1 week * Alternatively you can buy pre made salt packets for the NeilMed bottle or there          are other over the counter brands available  Instructions: . Warm  cup of the solution in the microwave if desired but be careful not to overheat as this will burn the inside of your nose . Stand over a sink (or do it while you shower) and squirt the solution into one side of your nose aiming towards the back of your head o Sometimes saying "coca  cola" while irrigating can be helpful to prevent fluid from going down your throat  . The solution will travel to the back of your nose and then come out the other side . Perform this again on the other side . Try to do this twice a day . If you are using a nasal spray in addition to the irrigation, irrigate first and then use the topical nasal spray otherwise you will wash the nasal spray out of your nose

## 2021-05-01 NOTE — Assessment & Plan Note (Signed)
Past history - Perennial rhinitis symptoms and using Vick's vapor rub. 2021 skin testing was negative to environmental allergies.  Interim history - increased PND symptoms. Flonase and azelastine not as helpful. Use Atrovent (ipratropium) 0.03% 1-2 sprays per nostril twice a day as needed for runny nose/drainage. Stop Flonase and azelastine.   Nasal saline spray (i.e., Simply Saline) or nasal saline lavage (i.e., NeilMed) is recommended as needed and prior to medicated nasal sprays.

## 2021-05-02 LAB — COMPREHENSIVE METABOLIC PANEL
ALT: 16 IU/L (ref 0–32)
AST: 19 IU/L (ref 0–40)
Albumin/Globulin Ratio: 1.8 (ref 1.2–2.2)
Albumin: 4.7 g/dL (ref 3.7–4.7)
Alkaline Phosphatase: 70 IU/L (ref 44–121)
BUN/Creatinine Ratio: 22 (ref 12–28)
BUN: 16 mg/dL (ref 8–27)
Bilirubin Total: 0.4 mg/dL (ref 0.0–1.2)
CO2: 25 mmol/L (ref 20–29)
Calcium: 9.4 mg/dL (ref 8.7–10.3)
Chloride: 105 mmol/L (ref 96–106)
Creatinine, Ser: 0.74 mg/dL (ref 0.57–1.00)
Globulin, Total: 2.6 g/dL (ref 1.5–4.5)
Glucose: 85 mg/dL (ref 65–99)
Potassium: 4.7 mmol/L (ref 3.5–5.2)
Sodium: 144 mmol/L (ref 134–144)
Total Protein: 7.3 g/dL (ref 6.0–8.5)
eGFR: 85 mL/min/{1.73_m2} (ref >59)

## 2021-05-02 LAB — CBC WITH DIFFERENTIAL/PLATELET
Basophils Absolute: 0 10*3/uL (ref 0.0–0.2)
Basos: 1 %
EOS (ABSOLUTE): 0 10*3/uL (ref 0.0–0.4)
Eos: 1 %
Hematocrit: 41.6 % (ref 34.0–46.6)
Hemoglobin: 13.8 g/dL (ref 11.1–15.9)
Immature Grans (Abs): 0 10*3/uL (ref 0.0–0.1)
Immature Granulocytes: 0 %
Lymphocytes Absolute: 1.5 10*3/uL (ref 0.7–3.1)
Lymphs: 40 %
MCH: 30.3 pg (ref 26.6–33.0)
MCHC: 33.2 g/dL (ref 31.5–35.7)
MCV: 91 fL (ref 79–97)
Monocytes Absolute: 0.4 10*3/uL (ref 0.1–0.9)
Monocytes: 12 %
Neutrophils Absolute: 1.8 10*3/uL (ref 1.4–7.0)
Neutrophils: 46 %
Platelets: 290 10*3/uL (ref 150–450)
RBC: 4.56 x10E6/uL (ref 3.77–5.28)
RDW: 12.8 % (ref 11.7–15.4)
WBC: 3.8 10*3/uL (ref 3.4–10.8)

## 2021-05-02 LAB — IGG, IGA, IGM
IgA/Immunoglobulin A, Serum: 74 mg/dL (ref 64–422)
IgG (Immunoglobin G), Serum: 1367 mg/dL (ref 586–1602)
IgM (Immunoglobulin M), Srm: 27 mg/dL (ref 26–217)

## 2021-05-02 LAB — C-REACTIVE PROTEIN: CRP: 1 mg/L (ref 0–10)

## 2021-05-02 LAB — SEDIMENTATION RATE: Sed Rate: 6 mm/hr (ref 0–40)

## 2021-05-06 ENCOUNTER — Encounter: Payer: Self-pay | Admitting: Adult Health

## 2021-05-06 ENCOUNTER — Ambulatory Visit (INDEPENDENT_AMBULATORY_CARE_PROVIDER_SITE_OTHER): Payer: 59 | Admitting: Adult Health

## 2021-05-06 VITALS — BP 115/53 | HR 76 | Ht 64.0 in | Wt 148.0 lb

## 2021-05-06 DIAGNOSIS — Z9989 Dependence on other enabling machines and devices: Secondary | ICD-10-CM | POA: Diagnosis not present

## 2021-05-06 DIAGNOSIS — G4733 Obstructive sleep apnea (adult) (pediatric): Secondary | ICD-10-CM | POA: Diagnosis not present

## 2021-05-06 NOTE — Patient Instructions (Addendum)
Continue using CPAP nightly and greater than 4 hours each night. Try Melatonin 1-3 mg 1-2 hours before bed. If your symptoms worsen or you develop new symptoms please let us know.

## 2021-05-06 NOTE — Progress Notes (Signed)
PATIENT: Leslie Reed DOB: [birth year]  REASON FOR VISIT: follow up HISTORY FROM: patient Primary neurologist: Dr. Brett Fairy  HISTORY OF PRESENT ILLNESS: Today 05/06/21:  Leslie Reed is a 75 year old female with a history of obstructive sleep apnea on CPAP.  She returns today for follow-up.  She reports that the CPAP is working well for her.  Reports that she is having some issues with fatigue that her PCP is doing blood work.  She reports that she does have a hard time sleeping for long periods of time.  Tends to wake up every 2 hours.  She returns today for an evaluation.   04/30/20 Leslie Reed is a 75 year old female with a history of obstructive sleep apnea on CPAP.  Her download indicates that she use her machine 27 out of 30 days for compliance of 90%.  She is averaging greater than 4 hours each night.  On average she uses her machine 4 hours and 58 minutes.  Her residual AHI is 2.3 on 5 to 15 cm of water with EPR 3.  Leak in the 95th percentile is 26.8 L/min.  Reports that the CPAP works fairly well for her.  She does note that she is not changed her supplies out since she received them.  She does have a so clean machine but has never used it.  HISTORY (Copied from Dr.Dohmeier's note) Interval history from 07 December 2018 I had the pleasure of seeing Leslie Reed today who had since I have seen her last undergone a home sleep test by watch Fraser Din on 07 December 2017 exactly a year ago home sleep test was based on an elevated fatigue severity score at 38 points and resulted in an AHI of 7.4, RDI was 8.6, there was no prolonged oxygenation time, heart rate was 59 bpm with a tendency to be bradycardic.  This was followed by a an in lab test was ordered for his REM behavior disorder and was refused by her insurance.  She started on an auto titration CPAP between 5 and 13 cmH2O following the home sleep test results there is 3 cm expiratory pressure relief set for her as well she has achieved and 97%  compliance and has been compliant in the last 2 visits with Cecille Rubin, NP.  Average use of time is 5 hours 3 minutes result residual AHI is 2.3 which is excellent the residual apneas consist mostly of obstructive apneas telling me that she could handle a little bit higher pressure well.  Epworth sleepiness score was reduced to 8, fatigue severity was still high at 42 and the geriatric depression score today was 4 out of 15 points.  We also addressed her mild cognitive impairment history which I think is partially due to overwhelming concern in regards to her son's failing health.  REVIEW OF SYSTEMS: Out of a complete 14 system review of symptoms, the patient complains only of the following symptoms, and all other reviewed systems are negative.  ESS 9 FSS 54  ALLERGIES: No Known Allergies  HOME MEDICATIONS: Outpatient Medications Prior to Visit  Medication Sig Dispense Refill  . Acetaminophen (TYLENOL PO) Take by mouth as needed (prior to infusion).    Marland Kitchen atorvastatin (LIPITOR) 20 MG tablet Take 1 tablet (20 mg total) by mouth daily. Instead of crestor (Patient taking differently: Take 20 mg by mouth every other day. Instead of crestor) 90 tablet 1  . diphenhydrAMINE (BENADRYL) 50 MG capsule Take 50 mg by mouth every 6 (six) hours as  needed. Prior to infusion    . Immune Globulin, Human, (CUVITRU [facility]) Inject into the skin. Twice a month    . ipratropium (ATROVENT) 0.03 % nasal spray Place 2 sprays into both nostrils 2 (two) times daily as needed (post nasal drip). 30 mL 5  . Minoxidil (ROGAINE EX) Apply topically.    Marland Kitchen Respiratory Therapy Supplies (FLUTTER) DEVI 1 each by Does not apply route 2 (two) times daily. 1 each 0  . SALINE NASAL SPRAY NA Place into the nose.    . Vitamin D, Ergocalciferol, (DRISDOL) 1.25 MG (50000 UNIT) CAPS capsule Take 1 capsule (50,000 Units total) by mouth every 14 (fourteen) days. 6 capsule 4   No facility-administered medications prior to visit.    PAST  MEDICAL HISTORY: Past Medical History:  Diagnosis Date  . Allergic rhinitis   . Allergy   . Arthritis    hands, lower back  . Asthma   . Bronchitis    uses inhaler prn  . Cataract    just being monitored - no surgery  . Colitis 8/04  . Diverticulitis   . GERD (gastroesophageal reflux disease)    diet controlled, no meds  . History of migraine headaches   . Hx of colonic polyp   . Hx of varicella   . Hyperlipidemia   . Insomnia   . 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 Mitral valve prolapse   . Osteopenia   . Scoliosis 2005  . Sleep apnea   . Tick bite 2014   STARI-antibiotics x 5 weeks  . Tracheobronchopathia-osteochondroplastica     PAST SURGICAL HISTORY: Past Surgical History:  Procedure Laterality Date  . ABDOMINAL HYSTERECTOMY     partial  for pain no cancer    . ADENOIDECTOMY    . APPENDECTOMY    . BREAST BIOPSY Right    x 2   . BRONCHIAL WASHINGS  04/22/2020   Procedure: BRONCHIAL lavage;  Surgeon: Julian Hy, DO;  Location: [facility];  Service: Endoscopy;;  . CESAREAN SECTION     x4  . CHOLECYSTECTOMY    . colon polyps    . COLONOSCOPY    . DILATION AND CURETTAGE OF UTERUS     x 2  . TONSILLECTOMY    . VIDEO BRONCHOSCOPY N/A 04/22/2020   Procedure: VIDEO BRONCHOSCOPY WITHOUT FLUORO;  Surgeon: Julian Hy, DO;  Location: [facility];  Service: Endoscopy;  Laterality: N/A;  . WISDOM TOOTH EXTRACTION      FAMILY HISTORY: Family History  Problem Relation Age of Onset  . Diabetes Mother   . Pulmonary fibrosis Mother        90 deceased  . Hypertension Mother   . Hypertension Father   . Alzheimer's disease Father   . Kidney failure Father   . Diabetes Maternal Grandmother   . Cancer Maternal Grandfather        ??  . Hypertension Brother   . Diabetes Sister   . Diabetes Son   . Allergy (severe) Son   . Depression Son   . Thyroid disease Daughter   . Psoriasis Daughter   . Thyroid disease Daughter   . Colon cancer Neg Hx   . Esophageal  cancer Neg Hx   . Rectal cancer Neg Hx   . Stomach cancer Neg Hx     SOCIAL HISTORY: Social History   Socioeconomic History  . Marital status: Married    Spouse name: Not on file  . Number of children: Not on file  .  Years of education: Not on file  . Highest education level: Not on file  Occupational History  . Not on file  Tobacco Use  . Smoking status: Passive Smoke Exposure - Never Smoker  . Smokeless tobacco: Never Used  Vaping Use  . Vaping Use: Never used  Substance and Sexual Activity  . Alcohol use: Yes    Comment: occ  . Drug use: No  . Sexual activity: Not Currently    Partners: Male    Birth control/protection: Surgical, Post-menopausal    Comment: TAH  Other Topics Concern  . 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  Generalized: Well developed, in no acute distress  Chest: Lungs clear to auscultation bilaterally  Neurological examination  Mentation: Alert oriented to time, place, history taking. Follows all commands speech and language fluent Cranial nerve II-XII: Extraocular movements were full, visual field were full on confrontational test Head turning and shoulder shrug  were normal and symmetric. Motor: The motor testing reveals 5 over 5 strength of all 4 extremities. Good symmetric motor tone is noted  throughout.  Sensory: Sensory testing is intact to soft touch on all 4 extremities. No evidence of extinction is noted.  Gait and station: Gait is normal.    DIAGNOSTIC DATA (LABS, IMAGING, TESTING) - I reviewed patient records, labs, notes, testing and imaging myself where available.  Lab Results  Component Value Date   WBC 3.8 05/01/2021   HGB 13.8 05/01/2021   HCT 41.6 05/01/2021   MCV 91 05/01/2021   PLT 290 05/01/2021      Component Value Date/Time   NA 144 05/01/2021 1111   K 4.7 05/01/2021 1111   CL 105 05/01/2021 1111   CO2 25 05/01/2021 1111   GLUCOSE 85 05/01/2021 1111   GLUCOSE 84 02/10/2021 1029   BUN 16 05/01/2021 1111   CREATININE 0.74 05/01/2021 1111   CREATININE 0.66 07/01/2015 1415   CALCIUM 9.4 05/01/2021 1111   PROT 7.3 05/01/2021 1111   ALBUMIN 4.7 05/01/2021 1111   AST 19 05/01/2021 1111   ALT 16 05/01/2021 1111   ALKPHOS 70 05/01/2021 1111   BILITOT 0.4 05/01/2021 1111   GFRNONAA 89.85 02/24/2010 1118   Lab Results  Component Value Date   CHOL 146 02/10/2021   HDL 48.70 02/10/2021   LDLCALC 80 02/10/2021   LDLDIRECT 138.0 10/13/2011   TRIG 89.0 02/10/2021   CHOLHDL 3 02/10/2021   Lab Results  Component Value Date   HGBA1C 5.5 02/10/2021   Lab Results  Component Value Date   VITAMINB12 384 10/08/2016   Lab Results  Component Value Date   TSH 0.96 12/18/2019  ASSESSMENT AND PLAN 75 y.o. year old female  has a past medical history of Allergic rhinitis, Allergy, Arthritis, Asthma, Bronchitis, Cataract, Colitis (8/04), Diverticulitis, GERD (gastroesophageal reflux disease), History of migraine headaches, colonic polyp, varicella, Hyperlipidemia, Insomnia, Interstitial cystitis (10/90), Mitral valve prolapse, Osteopenia, Scoliosis (2005), Sleep apnea, Tick bite (2014), and Tracheobronchopathia-osteochondroplastica. here with:  1. OSA on CPAP  - CPAP compliance excellent - Good treatment of AHI  - Encourage patient to use CPAP  nightly and > 4 hours each night -Discussed good sleep hygiene -Advised that she could try melatonin 1 to 3 mg 1 to 2 hours before bedtime - F/U in 1 year or sooner if needed    Ward Givens, MSN, NP-C 05/06/2021, 8:59 AM [facility] [address] [place], [place] [address] [phone]

## 2021-05-28 ENCOUNTER — Telehealth: Payer: Self-pay | Admitting: Allergy

## 2021-05-28 NOTE — Telephone Encounter (Signed)
Please call patient. If ipratropium not helping then okay to go back to using Flonase and azelastine.   Use Flonase (fluticasone) nasal spray 1 spray per nostril twice a day as needed for nasal congestion.  Use azelastine nasal spray 1-2 sprays per nostril twice a day as needed for runny nose/drainage.

## 2021-05-28 NOTE — Telephone Encounter (Signed)
Patient called following up on labs taken 05-01-2021. Patient stated she had not received a call back from anyone. I did let patient know Dr. Maudie Mercury sent her a message on mychart and it seems that the patient had seen it. Patient stated she was unsure and would ask her kids about it. I did relay Dr. Julianne Rice message to her about staying on same dose but doing infusions every 4 weeks. Patient verbalized understanding.   Patient then asked if she should stay on ipratropium as she is still coughing up phlegm when she wakes up and has sinus headaches. Patient states she does not notice that the ipratropium is doing anything. Patient would like to know if she should start using the previous nasal sprays she was on (azelastine, fluticasone)  Please advise  Best contact number for patient: [phone]

## 2021-05-28 NOTE — Telephone Encounter (Signed)
Please advise to staying on ipratropium and doing azelastine or flonase?

## 2021-05-28 NOTE — Telephone Encounter (Signed)
Called patient to go over note for the nasal sprays. She came in office for [facility] assistance. I gave her a copy of the directions as well so that she can have directions for proper use of each nasal spray.

## 2021-06-18 ENCOUNTER — Other Ambulatory Visit: Payer: Self-pay

## 2021-06-18 ENCOUNTER — Ambulatory Visit (INDEPENDENT_AMBULATORY_CARE_PROVIDER_SITE_OTHER): Payer: 59 | Admitting: Obstetrics & Gynecology

## 2021-06-18 ENCOUNTER — Encounter (HOSPITAL_BASED_OUTPATIENT_CLINIC_OR_DEPARTMENT_OTHER): Payer: Self-pay | Admitting: Obstetrics & Gynecology

## 2021-06-18 VITALS — BP 144/87 | HR 75 | Ht 64.5 in | Wt 146.6 lb

## 2021-06-18 DIAGNOSIS — Z90710 Acquired absence of both cervix and uterus: Secondary | ICD-10-CM

## 2021-06-18 DIAGNOSIS — M8580 Other specified disorders of bone density and structure, unspecified site: Secondary | ICD-10-CM | POA: Diagnosis not present

## 2021-06-18 DIAGNOSIS — Z01419 Encounter for gynecological examination (general) (routine) without abnormal findings: Secondary | ICD-10-CM

## 2021-06-18 DIAGNOSIS — D126 Benign neoplasm of colon, unspecified: Secondary | ICD-10-CM | POA: Diagnosis not present

## 2021-06-18 DIAGNOSIS — E559 Vitamin D deficiency, unspecified: Secondary | ICD-10-CM

## 2021-06-18 MED ORDER — VITAMIN D (ERGOCALCIFEROL) 1.25 MG (50000 UNIT) PO CAPS: 50000.0000 [IU] | ORAL_CAPSULE | ORAL | 4 refills | Status: DC

## 2021-06-18 NOTE — Progress Notes (Signed)
75 y.o. G1W2993 Married White or Caucasian female here for annual exam.  Denies vaginal bleeding.    Patient's last menstrual period was 11/29/1986.          Sexually active: No.  The current method of family planning is status post hysterectomy.    Smoker:  no  Health Maintenance: Pap:  08/14/2020 Negative History of abnormal Pap:  no MMG:  08/14/2020 Negative Colonoscopy:  09/09/2016, follow up 5 years BMD:   08/14/2020  TDaP:  04/18/2013 Pneumonia vaccine(s):  2018 and 2021 Shingrix:   Zoster completed Hep C testing: 10/08/2016 Screening Labs: Dr. Regis Bill   reports that she has never smoked. She has been exposed to tobacco smoke. She has never used smokeless tobacco. She reports current alcohol use. She reports that she does not use drugs.  Past Medical History:  Diagnosis Date   Allergic rhinitis    Allergy    Arthritis    hands, lower back   Asthma    Bronchitis    uses inhaler prn   Cataract    just being monitored - no surgery   Colitis 8/04   Diverticulitis    GERD (gastroesophageal reflux disease)    diet controlled, no meds   History of migraine headaches    Hx of colonic polyp    Hx of varicella    Hyperlipidemia    Insomnia    Interstitial cystitis 10/90   Mitral valve prolapse    Osteopenia    Scoliosis 2005   Sleep apnea    Tick bite 2014   STARI-antibiotics x 5 weeks   Tracheobronchopathia-osteochondroplastica     Past Surgical History:  Procedure Laterality Date   ABDOMINAL HYSTERECTOMY     partial  for pain no cancer     ADENOIDECTOMY     APPENDECTOMY     BREAST BIOPSY Right    x 2    BRONCHIAL WASHINGS  04/22/2020   Procedure: BRONCHIAL lavage;  Surgeon: Julian Hy, DO;  Location: [facility];  Service: Endoscopy;;   CESAREAN SECTION     x4   CHOLECYSTECTOMY     colon polyps     COLONOSCOPY     DILATION AND CURETTAGE OF UTERUS     x 2   TONSILLECTOMY     VIDEO BRONCHOSCOPY N/A 04/22/2020   Procedure: VIDEO BRONCHOSCOPY WITHOUT  FLUORO;  Surgeon: Julian Hy, DO;  Location: [facility];  Service: Endoscopy;  Laterality: N/A;   WISDOM TOOTH EXTRACTION      Current Outpatient Medications  Medication Sig Dispense Refill   Acetaminophen (TYLENOL PO) Take by mouth as needed (prior to infusion).     atorvastatin (LIPITOR) 20 MG tablet Take 1 tablet (20 mg total) by mouth daily. Instead of crestor (Patient taking differently: Take 20 mg by mouth every other day. Instead of crestor) 90 tablet 1   diphenhydrAMINE (BENADRYL) 50 MG capsule Take 50 mg by mouth every 6 (six) hours as needed. Prior to infusion     Immune Globulin, Human, (CUVITRU [place]) Inject into the skin. Twice a month     ipratropium (ATROVENT) 0.03 % nasal spray Place 2 sprays into both nostrils 2 (two) times daily as needed (post nasal drip). 30 mL 5   Minoxidil (ROGAINE EX) Apply topically.     Respiratory Therapy Supplies (FLUTTER) DEVI 1 each by Does not apply route 2 (two) times daily. 1 each 0   SALINE NASAL SPRAY NA Place into the nose.     Vitamin  D, Ergocalciferol, (DRISDOL) 1.25 MG (50000 UNIT) CAPS capsule Take 1 capsule (50,000 Units total) by mouth every 14 (fourteen) days. 6 capsule 4   No current facility-administered medications for this visit.    Family History  Problem Relation Age of Onset   Diabetes Mother    Pulmonary fibrosis Mother        43 deceased   Hypertension Mother    Hypertension Father    Alzheimer's disease Father    Kidney failure Father    Diabetes Maternal Grandmother    Cancer Maternal Grandfather        ??   Hypertension Brother    Diabetes Sister    Diabetes Son    Allergy (severe) Son    Depression Son    Thyroid disease Daughter    Psoriasis Daughter    Thyroid disease Daughter    Colon cancer Neg Hx    Esophageal cancer Neg Hx    Rectal cancer Neg Hx    Stomach cancer Neg Hx     Review of Systems  All other systems reviewed and are negative.  Exam:   BP (!) 144/87 (BP Location: Right Arm,  Patient Position: Sitting, Cuff Size: Small)   Pulse 75   Ht 5' 4.5" (1.638 m)   Wt 146 lb 9.6 oz (66.5 kg)   LMP 11/29/1986   BMI 24.78 kg/m   Height: 5' 4.5" (163.8 cm)  General appearance: alert, cooperative and appears stated age Head: Normocephalic, without obvious abnormality, atraumatic Neck: no adenopathy, supple, symmetrical, trachea midline and thyroid normal to inspection and palpation Lungs: clear to auscultation bilaterally Breasts: normal appearance, no masses or tenderness Heart: regular rate and rhythm Abdomen: soft, non-tender; bowel sounds normal; no masses,  no organomegaly Extremities: extremities normal, atraumatic, no cyanosis or edema Skin: Skin color, texture, turgor normal. No rashes or lesions Lymph nodes: Cervical, supraclavicular, and axillary nodes normal. No abnormal inguinal nodes palpated Neurologic: Grossly normal   Pelvic: External genitalia:  no lesions              Urethra:  normal appearing urethra with no masses, tenderness or lesions              Bartholins and Skenes: normal                 Vagina: normal appearing vagina with normal color and no discharge, no lesions              Cervix: absent              Pap taken: No. Bimanual Exam:  Uterus:  uterus absent              Adnexa: no mass, fullness, tenderness               Rectovaginal: Confirms               Anus:  normal sphincter tone, no lesions  Chaperone, Octaviano Batty, CMA, was present for exam.  Assessment/Plan: 1. Well woman exam with routine gynecological exam - pap smears not indicated - MMG 07/2020 - BMD 07/2020 - colonoscopy due later this year - vaccines updated - blood work done with Dr. Regis Bill  2. S/P TAH (total abdominal hysterectomy)  3. Benign neoplasm of colon, unspecified part of colon  4. Osteopenia, unspecified location - following BMD  5. Vitamin D deficiency - Vit D 50K every 14 days.  #6/4RF.

## 2021-06-19 ENCOUNTER — Telehealth: Payer: Self-pay | Admitting: *Deleted

## 2021-06-19 NOTE — Telephone Encounter (Signed)
Had message from [facility] advising that when they contacted patient she advised change of Cuvitru from every 2 weeks to every 4 weeks. I did look back in lab notes from Dr Maudie Mercury and called them back giving verbal for dose change to every 4 weeks with same dose

## 2021-06-20 ENCOUNTER — Other Ambulatory Visit: Payer: Self-pay | Admitting: Allergy

## 2021-06-22 NOTE — Telephone Encounter (Signed)
L/M for patient per Dr Maudie Mercury will do 20 grams every 4 weeks. I have already spoken to [facility] but will reach out again to make sure

## 2021-06-22 NOTE — Telephone Encounter (Signed)
Please dispense 90 day refill with no refills.

## 2021-06-22 NOTE — Telephone Encounter (Signed)
Spoke to patient and advised change to 20grams every 28 days and verbal order to [facility]

## 2021-06-27 ENCOUNTER — Other Ambulatory Visit: Payer: Self-pay | Admitting: Obstetrics & Gynecology

## 2021-07-13 ENCOUNTER — Telehealth: Payer: Self-pay | Admitting: *Deleted

## 2021-07-13 NOTE — Telephone Encounter (Signed)
Patient called to advise change in Ins eff Sept 2022.  I advised patient to call me when she gets actual card since she does not have the prefix to her number and I will start approval 9/1

## 2021-08-05 ENCOUNTER — Telehealth: Payer: Self-pay | Admitting: *Deleted

## 2021-08-05 NOTE — Telephone Encounter (Signed)
Patient called and advised new Ins and gave info for approval for he Cuvitru. Advised will need to change pharmacy also. Will advise when approval done and submitted to [employer]

## 2021-08-12 ENCOUNTER — Other Ambulatory Visit: Payer: Self-pay

## 2021-08-12 ENCOUNTER — Ambulatory Visit: Payer: BC Managed Care – PPO | Admitting: Allergy

## 2021-08-12 ENCOUNTER — Encounter: Payer: Self-pay | Admitting: Allergy

## 2021-08-12 VITALS — BP 130/82 | HR 78 | Temp 98.0°F | Resp 16 | Ht 64.0 in | Wt 143.1 lb

## 2021-08-12 DIAGNOSIS — J310 Chronic rhinitis: Secondary | ICD-10-CM | POA: Diagnosis not present

## 2021-08-12 DIAGNOSIS — D801 Nonfamilial hypogammaglobulinemia: Secondary | ICD-10-CM

## 2021-08-12 DIAGNOSIS — J479 Bronchiectasis, uncomplicated: Secondary | ICD-10-CM

## 2021-08-12 DIAGNOSIS — R519 Headache, unspecified: Secondary | ICD-10-CM

## 2021-08-12 NOTE — Assessment & Plan Note (Addendum)
   Follow up with PCP/neurology regarding the headaches.

## 2021-08-12 NOTE — Progress Notes (Signed)
Follow Up Note  RE: Leslie Reed MRN: [id number] DOB: 27-Jul-1946 Date of Office Visit: 08/12/2021  Referring provider: Burnis Medin, MD Primary care provider: Burnis Medin, MD  Chief Complaint: Follow-up  History of Present Illness: I had the pleasure of seeing Leslie Reed for a follow up visit at the [facility] on 08/12/2021. She is a 75 y.o. female, who is being followed for hypogammaglobulinemia, bronchiectasis, chronic rhinitis. Her previous allergy office visit was on 05/01/2021 with Dr. Maudie Mercury. Today is a regular follow up visit.  Hypogammaglobulinemia  Currently on Cuvitru 20g every 4 weeks and is doing well on it - improved arthralgias. Fatigue is about the same but she believes it is due to her sleep issues.  Having some issues with insurance due to change - she will contact Tammy regarding this.  No infections since the last visit.   Bronchiectasis without complication Still having some coughing up with phlegm. Using flutter valve twice a day but not sure if it's working as well. No pulmonology visit since last OV.   Chronic rhinitis Noticed some nasal congestion and headaches since not using nasal sprays on a consistent basis. Currently on Flonase 1 spray per nostril 1-2 times per day and ipratropium as needed only.  Patient wears a cpap machine and had issues with the strap. She also has TMJ. Noticed some ear fullness/drainage on the left side along with some left sided facial tingling/numbness at times.   Assessment and Plan: Leslie Reed is a 75 y.o. female with: Hypogammaglobulinemia ([place]) Past history - Patient referred by pulmonology for low IgG and IgM. History of frequent upper respiratory infections, mild central bronchiectasis, mother who passed away from IPF. Up to date with COVID-19 vaccine, flu, pneumovax and prevnar.  2021 bloodwork diptheria, tetanus and s. Pneumonia titers adequate. Alpha 1 level and total complement level normal.  No history of meningitis or blood clots.  Interim history -  Arthralgias improved with Cuvitru 20g every 4 weeks. No infections. Fatigue and headache is about the same.  Continue cuvitru infusions every 4 weeks.  Keep track of infections. Recommend getting the flu vaccine and Covid-19 bivalent booster.   Bronchiectasis without complication [facility]) Past history - 2022 spirometry was normal. Interim history - no recent pulmonology visit, coughing is about the same.  Follow up with pulmonology and their recommendations. Continue to use the flutter valve daily - see handout regarding cleaning and use.  Chronic rhinitis Past history - Perennial rhinitis symptoms and using Vick's vapor rub. 2021 skin testing was negative to environmental allergies.  Interim history - nasal congestion and headaches.  Use Atrovent (ipratropium) 0.03% 1-2 sprays per nostril twice a day as needed for runny nose/drainage. Use Flonase (fluticasone) nasal spray 1 spray per nostril twice a day for nasal congestion.  Nasal saline spray (i.e., Simply Saline) or nasal saline lavage (i.e., NeilMed) is recommended as needed and prior to medicated nasal sprays. Follow up with ENT for the ear issues.  Generalized headaches Follow up with PCP/neurology regarding the headaches.   Return in about 4 months (around 12/12/2021).  No orders of the defined types were placed in this encounter.  Lab Orders         CBC with Differential/Platelet         IgG, IgA, IgM         C-reactive protein         Sedimentation rate         Comprehensive metabolic  panel      Diagnostics: None.  Medication List:  Current Outpatient Medications  Medication Sig Dispense Refill   Acetaminophen (TYLENOL PO) Take by mouth as needed (prior to infusion).     atorvastatin (LIPITOR) 20 MG tablet Take 1 tablet (20 mg total) by mouth daily. Instead of crestor (Patient taking differently: Take 20 mg by mouth every other day. Instead of crestor) 90  tablet 1   diphenhydrAMINE (BENADRYL) 50 MG capsule Take 50 mg by mouth every 6 (six) hours as needed. Prior to infusion     fluticasone (FLONASE) 50 MCG/ACT nasal spray SHAKE LIQUID AND USE 2 SPRAYS IN EACH NOSTRIL DAILY 48 g 0   Immune Globulin, Human, (CUVITRU [place]) Inject 20 g into the skin every 28 (twenty-eight) days.     ipratropium (ATROVENT) 0.03 % nasal spray Place 2 sprays into both nostrils 2 (two) times daily as needed (post nasal drip). 30 mL 5   Minoxidil (ROGAINE EX) Apply topically.     Respiratory Therapy Supplies (FLUTTER) DEVI 1 each by Does not apply route 2 (two) times daily. 1 each 0   SALINE NASAL SPRAY NA Place into the nose.     Vitamin D, Ergocalciferol, (DRISDOL) 1.25 MG (50000 UNIT) CAPS capsule Take 1 capsule (50,000 Units total) by mouth every 14 (fourteen) days. 6 capsule 4   No current facility-administered medications for this visit.   Allergies: No Known Allergies I reviewed her past medical history, social history, family history, and environmental history and no significant changes have been reported from her previous visit.  Review of Systems  Constitutional:  Negative for appetite change, chills, fever and unexpected weight change.  HENT:  Positive for congestion. Negative for ear pain and rhinorrhea.   Eyes:  Negative for itching.  Respiratory:  Positive for cough. Negative for chest tightness, shortness of breath and wheezing.   Cardiovascular:  Negative for chest pain.  Gastrointestinal:  Negative for abdominal pain.  Genitourinary:  Negative for difficulty urinating.  Musculoskeletal:  Positive for arthralgias.  Allergic/Immunologic: Negative for environmental allergies and food allergies.  Neurological:  Positive for headaches.   Objective: BP 130/82   Pulse 78   Temp 98 F (36.7 C) (Temporal)   Resp 16   Ht '5\' 4"'$  (1.626 m)   Wt 143 lb 2 oz (64.9 kg)   LMP 11/29/1986   SpO2 96%   BMI 24.57 kg/m  Body mass index is 24.57  kg/m. Physical Exam Vitals and nursing note reviewed.  Constitutional:      Appearance: Normal appearance. She is well-developed.  HENT:     Head: Normocephalic and atraumatic.     Right Ear: Tympanic membrane and external ear normal.     Left Ear: Tympanic membrane and external ear normal.     Nose: Nose normal. No congestion or rhinorrhea.     Mouth/Throat:     Mouth: Mucous membranes are moist.     Pharynx: Oropharynx is clear.  Eyes:     Conjunctiva/sclera: Conjunctivae normal.  Cardiovascular:     Rate and Rhythm: Normal rate and regular rhythm.     Heart sounds: Normal heart sounds. No murmur heard.   No friction rub. No gallop.  Pulmonary:     Effort: Pulmonary effort is normal.     Breath sounds: Normal breath sounds. No wheezing, rhonchi or rales.  Musculoskeletal:     Cervical back: Neck supple.  Skin:    General: Skin is warm.  Neurological:  Mental Status: She is alert and oriented to person, place, and time.  Psychiatric:        Mood and Affect: Mood normal.        Behavior: Behavior normal.  Previous notes and tests were reviewed. The plan was reviewed with the patient/family, and all questions/concerned were addressed.  It was my pleasure to see Leslie Reed today and participate in her care. Please feel free to contact me with any questions or concerns.  Sincerely,  Rexene Alberts, DO Allergy & Immunology  [facility] office: [place] office: [phone]

## 2021-08-12 NOTE — Patient Instructions (Addendum)
Low IgG:  Continue cuvitru infusions every 4 weeks.  Keep track of infections. Get bloodwork:  We are ordering labs, so please allow 1-2 weeks for the results to come back. With the newly implemented Cures Act, the labs might be visible to you at the same time that they become visible to me. However, I will not address the results until all of the results are back, so please be patient.   Rhinitis:  Use Atrovent (ipratropium) 0.03% 1-2 sprays per nostril twice a day as needed for runny nose/drainage. Use Flonase (fluticasone) nasal spray 1 spray per nostril twice a day for nasal congestion.  Nasal saline spray (i.e., Simply Saline) or nasal saline lavage (i.e., NeilMed) is recommended as needed and prior to medicated nasal sprays.  Follow up with pulmonology and their recommendations. Continue to use the flutter valve daily - see handout regarding cleaning and use.   Ear Follow up with ENT if you have issues - Dr. Benjamine Mola.  Follow up in 4 months or sooner if needed. Follow up with PCP regarding the headaches.    Buffered Isotonic Saline Irrigations:  Goal: When you irrigate with the isotonic saline (salt water) it washes mucous and other debris from your nose that could be contributing to your nasal symptoms.   Recipe: Obtain 1 quart jar that is clean Fill with clean (bottled, boiled or distilled) water Add 1-2 heaping teaspoons of salt without iodine If the solution with 2 teaspoons of salt is too strong, adjust the amount down until better tolerated Add 1 teaspoon of Arm [employer] Hammer baking soda (pure bicarbonate) Mix ingredients together and store at room temperature and discard after 1 week * Alternatively you can buy pre made salt packets for the NeilMed bottle or there          are other over the counter brands available  Instructions: Warm  cup of the solution in the microwave if desired but be careful not to overheat as this will burn the inside of your nose Stand over a sink  (or do it while you shower) and squirt the solution into one side of your nose aiming towards the back of your head Sometimes saying "coca cola" while irrigating can be helpful to prevent fluid from going down your throat  The solution will travel to the back of your nose and then come out the other side Perform this again on the other side Try to do this twice a day If you are using a nasal spray in addition to the irrigation, irrigate first and then use the topical nasal spray otherwise you will wash the nasal spray out of your nose

## 2021-08-12 NOTE — Assessment & Plan Note (Signed)
Past history - 2022 spirometry was normal. Interim history - no recent pulmonology visit, coughing is about the same.   Follow up with pulmonology and their recommendations.  Continue to use the flutter valve daily - see handout regarding cleaning and use.

## 2021-08-12 NOTE — Assessment & Plan Note (Signed)
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 History of frequent upper respiratory infections, mild central bronchiectasis, mother who passed away from IPF. Up to date with COVID-19 vaccine, flu, pneumovax and prevnar.  2021 bloodwork diptheria, tetanus and s. Pneumonia titers adequate. Alpha 1 level and total complement level normal. No history of meningitis or blood clots.  Interim history -  Arthralgias improved with Cuvitru 20g every 4 weeks. No infections. Fatigue and headache is about the same.   Continue cuvitru infusions every 4 weeks.   Keep track of infections.  Recommend getting the flu vaccine and Covid-19 bivalent booster.

## 2021-08-12 NOTE — Assessment & Plan Note (Addendum)
Past history - Perennial rhinitis symptoms and using Vick's vapor rub. 2021 skin testing was negative to environmental allergies.  Interim history - nasal congestion and headaches.  . Use Atrovent (ipratropium) 0.03% 1-2 sprays per nostril twice a day as needed for runny nose/drainage. . Use Flonase (fluticasone) nasal spray 1 spray per nostril twice a day for nasal congestion.   Nasal saline spray (i.e., Simply Saline) or nasal saline lavage (i.e., NeilMed) is recommended as needed and prior to medicated nasal sprays.  Follow up with ENT for the ear issues.

## 2021-08-13 LAB — COMPREHENSIVE METABOLIC PANEL
ALT: 18 IU/L (ref 0–32)
AST: 21 IU/L (ref 0–40)
Albumin/Globulin Ratio: 2 (ref 1.2–2.2)
Albumin: 4.9 g/dL — ABNORMAL HIGH (ref 3.7–4.7)
Alkaline Phosphatase: 80 IU/L (ref 44–121)
BUN/Creatinine Ratio: 20 (ref 12–28)
BUN: 15 mg/dL (ref 8–27)
Bilirubin Total: 0.4 mg/dL (ref 0.0–1.2)
CO2: 23 mmol/L (ref 20–29)
Calcium: 10 mg/dL (ref 8.7–10.3)
Chloride: 102 mmol/L (ref 96–106)
Creatinine, Ser: 0.75 mg/dL (ref 0.57–1.00)
Globulin, Total: 2.4 g/dL (ref 1.5–4.5)
Glucose: 90 mg/dL (ref 65–99)
Potassium: 5 mmol/L (ref 3.5–5.2)
Sodium: 143 mmol/L (ref 134–144)
Total Protein: 7.3 g/dL (ref 6.0–8.5)
eGFR: 83 mL/min/{1.73_m2} (ref >59)

## 2021-08-13 LAB — CBC WITH DIFFERENTIAL/PLATELET
Basophils Absolute: 0 10*3/uL (ref 0.0–0.2)
Basos: 0 %
EOS (ABSOLUTE): 0 10*3/uL (ref 0.0–0.4)
Eos: 1 %
Hematocrit: 44.2 % (ref 34.0–46.6)
Hemoglobin: 14.3 g/dL (ref 11.1–15.9)
Immature Grans (Abs): 0 10*3/uL (ref 0.0–0.1)
Immature Granulocytes: 0 %
Lymphocytes Absolute: 1.4 10*3/uL (ref 0.7–3.1)
Lymphs: 27 %
MCH: 30 pg (ref 26.6–33.0)
MCHC: 32.4 g/dL (ref 31.5–35.7)
MCV: 93 fL (ref 79–97)
Monocytes Absolute: 0.5 10*3/uL (ref 0.1–0.9)
Monocytes: 9 %
Neutrophils Absolute: 3.3 10*3/uL (ref 1.4–7.0)
Neutrophils: 63 %
Platelets: 305 10*3/uL (ref 150–450)
RBC: 4.77 x10E6/uL (ref 3.77–5.28)
RDW: 13.2 % (ref 11.7–15.4)
WBC: 5.2 10*3/uL (ref 3.4–10.8)

## 2021-08-13 LAB — IGG, IGA, IGM
IgA/Immunoglobulin A, Serum: 77 mg/dL (ref 64–422)
IgG (Immunoglobin G), Serum: 1028 mg/dL (ref 586–1602)
IgM (Immunoglobulin M), Srm: 29 mg/dL (ref 26–217)

## 2021-08-13 LAB — SEDIMENTATION RATE: Sed Rate: 2 mm/hr (ref 0–40)

## 2021-08-13 LAB — C-REACTIVE PROTEIN: CRP: <1 mg/L (ref 0–10)

## 2021-08-20 ENCOUNTER — Telehealth: Payer: Self-pay | Admitting: *Deleted

## 2021-08-20 NOTE — Telephone Encounter (Signed)
Patient had change of Ins to [facility] recently. I had submitted her Cuvitru for approval and they have denied it. I did fax appeal with all her labs, notes and assessment to treatment but they are the toughest.  She does not have selective  antibody deficiency or low IgG according to their policy. We can try for a 2nd level appeal but it will probably need more supporting material.Their policy on covers if it meets their policy: Primary Immunodeficiency Syndromes The diagnosis of immunodeficiency and post immunization titers must be taken in context with the clinical  presentation of the patient, and may vary dependent on the type of vaccine given and the prior immunization  history of the patient. The following parameters are examples of criteria for diagnosis of the primary  immunodeficiency syndromes. Laboratory evidence of immunoglobulin deficiency may include the following definitions:   Agammaglobulinemia (total IgG less than 200 mg/dL)  Persistent hypogammaglobulinemia (total IgG less than 400 mg/dL, or below the lower limit of normal  defined as at least two standard deviations below the age-adjusted mean, on at least two occasions)  Absence of B lymphocytes Inability to mount an adequate antibody response to inciting antigens may include the following definitions:   Lack of appropriate rise in antibody titer following provocation with a polysaccharide antigen. For  example, an adequate response to the pneumococcal vaccine may be defined as at least a four-fold  increase in titers for at least 50% of serotypes tested.   Lack of appropriate rise in antibody titer following provocation with a protein antigen. For example, an  adequate response to tetanus/diphtheria vaccine may be defined as less than a four-fold rise in titers 3-4  weeks after vaccine administration. According to a 2010 national guideline from [place] on immune globulin for primary immune deficiency,  although higher trough  levels of IVIg may be associated with clinical response; the goal of IVIg dose increases  should be to improve clinical effectiveness and not merely to increase trough levels.

## 2021-08-20 NOTE — Telephone Encounter (Signed)
Leslie Reed, Did you try to reach out to the Cuvitru rep and see if they can help out?   Persistent hypogammaglobulinemia (total IgG less than 400 mg/dL, or below the lower limit of normal  defined as at least two standard deviations below the age-adjusted mean, on at least two occasions)  What ranges are they using for age-adjusted means and the standard deviations?  Component     Latest Ref Rng & Units 04/01/2020 04/01/2020         9:54 AM  9:54 AM  IgG (Immunoglobin G), Serum     586 - 1,602 mg/dL 558 (L) 519 (L)

## 2021-08-26 ENCOUNTER — Ambulatory Visit: Payer: 59 | Admitting: Allergy

## 2021-08-28 ENCOUNTER — Encounter (HOSPITAL_BASED_OUTPATIENT_CLINIC_OR_DEPARTMENT_OTHER): Payer: Self-pay | Admitting: Obstetrics & Gynecology

## 2021-08-28 NOTE — Telephone Encounter (Signed)
Spoke to patient and advised working on 2nd level appeal and will reach out to her. She advised she took her dose this week so will not be ready for next dose until 4 weeks out

## 2021-10-01 NOTE — Telephone Encounter (Signed)
Called patient and advised that she needs to fill out form to send to [facility] (new) for appeal representation. Will fax to [place] to have her sign

## 2021-10-06 ENCOUNTER — Encounter: Payer: Self-pay | Admitting: Gastroenterology

## 2021-10-07 ENCOUNTER — Encounter: Payer: Self-pay | Admitting: Gastroenterology

## 2021-10-14 NOTE — Telephone Encounter (Signed)
Called [employer] [place] to check on appeal and they advised 30 days from 11/3 is when the have to make decision

## 2021-10-20 DIAGNOSIS — D1809 Hemangioma of other sites: Secondary | ICD-10-CM | POA: Diagnosis not present

## 2021-10-20 DIAGNOSIS — L578 Other skin changes due to chronic exposure to nonionizing radiation: Secondary | ICD-10-CM | POA: Diagnosis not present

## 2021-10-20 DIAGNOSIS — L309 Dermatitis, unspecified: Secondary | ICD-10-CM | POA: Diagnosis not present

## 2021-10-20 DIAGNOSIS — D2230 Melanocytic nevi of unspecified part of face: Secondary | ICD-10-CM | POA: Diagnosis not present

## 2021-10-20 DIAGNOSIS — D485 Neoplasm of uncertain behavior of skin: Secondary | ICD-10-CM | POA: Diagnosis not present

## 2021-10-20 DIAGNOSIS — C44519 Basal cell carcinoma of skin of other part of trunk: Secondary | ICD-10-CM | POA: Diagnosis not present

## 2021-10-20 DIAGNOSIS — L658 Other specified nonscarring hair loss: Secondary | ICD-10-CM | POA: Diagnosis not present

## 2021-11-09 NOTE — Telephone Encounter (Signed)
Called patient and advised [employer] denied her second level appeal for her Cuvitru per their policy. She has been on free drug through manufacturer and received her last dose to take here in the next few weeks and will follow up with Dr Maudie Mercury 12/14/21   Primary Immunodeficiency Syndromes The diagnosis of immunodeficiency and post immunization titers must be taken in context with the clinical presentation of the patient, and may vary dependent on the type of vaccine given and the prior immunization history of the patient. The following parameters are examples of criteria for diagnosis of the primary immunodeficiency syndromes. Laboratory evidence of immunoglobulin deficiency may include the following definitions:  Agammaglobulinemia (total IgG less than 200 mg/dL)  Persistent hypogammaglobulinemia (total IgG less than 400 mg/dL, or below the lower limit of normal defined as at least two standard deviations below the age-adjusted mean, on at least two occasions)  Absence of B lymphocytes Inability to mount an adequate antibody response to inciting antigens may include the following definitions:  Lack of appropriate rise in antibody titer following provocation with a polysaccharide antigen. For example, an adequate response to the pneumococcal vaccine may be defined as at least a four-fold increase in titers for at least 50% of serotypes tested.  Lack of appropriate rise in antibody titer following provocation with a protein antigen. For example, an adequate response to tetanus/diphtheria vaccine may be defined as less than a four-fold rise in titers 3-4 weeks after vaccine administration. According to a 2010 national guideline from [place] on immune globulin for primary immune deficiency, although higher trough levels of IVIg may be associated with clinical response; the goal of IVIg dose increases should be to improve clinical effectiveness and not merely to increase trough levels

## 2021-11-20 DIAGNOSIS — S61412A Laceration without foreign body of left hand, initial encounter: Secondary | ICD-10-CM | POA: Diagnosis not present

## 2021-11-20 DIAGNOSIS — Z23 Encounter for immunization: Secondary | ICD-10-CM | POA: Diagnosis not present

## 2021-12-01 ENCOUNTER — Other Ambulatory Visit: Payer: Self-pay

## 2021-12-01 ENCOUNTER — Ambulatory Visit (AMBULATORY_SURGERY_CENTER): Payer: 59 | Admitting: *Deleted

## 2021-12-01 VITALS — Ht 64.0 in | Wt 148.0 lb

## 2021-12-01 DIAGNOSIS — Z86010 Personal history of colonic polyps: Secondary | ICD-10-CM

## 2021-12-01 MED ORDER — NA SULFATE-K SULFATE-MG SULF 17.5-3.13-1.6 GM/177ML PO SOLN: 1.0000 | ORAL | 0 refills | Status: DC

## 2021-12-01 NOTE — Progress Notes (Signed)
Patient is here in-person for PV. Patient denies any allergies to eggs or soy. Patient denies any problems with anesthesia/sedation. Patient is not on any oxygen at home. Patient is not taking any diet/weight loss medications or blood thinners. Patient is aware of our care-partner policy and Covid-19 safety protocol.   EMMI education assigned to the patient for the procedure, sent to MyChart.   Patient is COVID-19 vaccinated.  

## 2021-12-02 ENCOUNTER — Encounter: Payer: Self-pay | Admitting: Gastroenterology

## 2021-12-03 NOTE — Telephone Encounter (Signed)
I have spoken to the patient assistance program and explained how [employer] will approve her Cuvitru. PAP has spoken to her she does have part B approved diagnosis if she wants to try to get on Medicare. Unfortunately [employer] does not have any way around their policy to approved low IgM diagnosis and her IgG do not qualify her

## 2021-12-07 ENCOUNTER — Other Ambulatory Visit: Payer: Self-pay

## 2021-12-07 ENCOUNTER — Ambulatory Visit (AMBULATORY_SURGERY_CENTER): Payer: BC Managed Care – PPO | Admitting: Gastroenterology

## 2021-12-07 ENCOUNTER — Encounter: Payer: Self-pay | Admitting: Gastroenterology

## 2021-12-07 VITALS — BP 129/64 | HR 64 | Temp 97.8°F | Resp 14 | Ht 64.0 in | Wt 148.0 lb

## 2021-12-07 DIAGNOSIS — Z1211 Encounter for screening for malignant neoplasm of colon: Secondary | ICD-10-CM | POA: Diagnosis not present

## 2021-12-07 DIAGNOSIS — Z86010 Personal history of colonic polyps: Secondary | ICD-10-CM

## 2021-12-07 MED ORDER — SODIUM CHLORIDE 0.9 % IV SOLN: 500.0000 mL | INTRAVENOUS | Status: DC

## 2021-12-07 NOTE — Progress Notes (Signed)
Pt's states no medical or surgical changes since previsit or office visit. 

## 2021-12-07 NOTE — Progress Notes (Signed)
[facility] History and Physical   Primary Care Physician:  Burnis Medin, MD   Reason for Procedure:  History of adenomatous colon polyps  Plan:    Surveillance colonoscopy with possible interventions as needed     HPI: Leslie Reed is a very pleasant 76 y.o. female here for surveillance colonoscopy. Denies any nausea, vomiting, abdominal pain, melena or bright red blood per rectum  The risks and benefits as well as alternatives of endoscopic procedure(s) have been discussed and reviewed. All questions answered. The patient agrees to proceed.    Past Medical History:  Diagnosis Date   Allergic rhinitis    Allergy    Arthritis    hands, lower back   Asthma    Bronchitis    uses inhaler prn   Cataract    just being monitored - no surgery   Colitis 8/04   Diverticulitis    GERD (gastroesophageal reflux disease)    diet controlled, no meds   History of migraine headaches    Hx of colonic polyp    Hx of varicella    Hyperlipidemia    Insomnia    Interstitial cystitis 10/90   Mitral valve prolapse    Osteopenia    Scoliosis 2005   Sleep apnea    Tick bite 2014   STARI-antibiotics x 5 weeks   Tracheobronchopathia-osteochondroplastica     Past Surgical History:  Procedure Laterality Date   ABDOMINAL HYSTERECTOMY     partial  for pain no cancer     ADENOIDECTOMY     APPENDECTOMY     BREAST BIOPSY Right    x 2    BRONCHIAL WASHINGS  04/22/2020   Procedure: BRONCHIAL lavage;  Surgeon: Julian Hy, DO;  Location: [facility];  Service: Endoscopy;;   CESAREAN SECTION     x4   CHOLECYSTECTOMY     colon polyps     COLONOSCOPY  09/09/2016   Dr.Kalif Kattner   DILATION AND CURETTAGE OF UTERUS     x 2   TONSILLECTOMY     VIDEO BRONCHOSCOPY N/A 04/22/2020   Procedure: VIDEO BRONCHOSCOPY WITHOUT FLUORO;  Surgeon: Julian Hy, DO;  Location: [facility];  Service: Endoscopy;  Laterality: N/A;   WISDOM TOOTH EXTRACTION      Prior to Admission  medications   Medication Sig Start Date End Date Taking? Authorizing Provider  Acetaminophen (TYLENOL PO) Take by mouth as needed (prior to infusion).   Yes [provider]  atorvastatin (LIPITOR) 20 MG tablet Take 1 tablet (20 mg total) by mouth daily. Instead of crestor Patient taking differently: Take 20 mg by mouth every other day. Instead of crestor 02/24/21  Yes Panosh, Standley Brooking, MD  fluticasone [facility]) 50 MCG/ACT nasal spray SHAKE LIQUID AND USE 2 SPRAYS IN [facility] NOSTRIL DAILY 06/22/21  Yes Garnet Sierras, DO  Minoxidil (ROGAINE EX) Apply topically.   Yes [provider]  Na Sulfate-K Sulfate-Mg Sulf 17.5-3.13-1.6 GM/177ML SOLN Take 1 kit by mouth as directed. May use generic Suprep 12/01/21  Yes Muhamad Serano, Venia Minks, MD  ipratropium (ATROVENT) 0.03 % nasal spray Place 2 sprays into both nostrils 2 (two) times daily as needed (post nasal drip). 05/01/21   Garnet Sierras, DO  Respiratory Therapy Supplies (FLUTTER) DEVI 1 each by Does not apply route 2 (two) times daily. 04/01/20   Julian Hy, DO  Vitamin D, Ergocalciferol, (DRISDOL) 1.25 MG (50000 UNIT) CAPS capsule Take 1 capsule (50,000 Units total) by mouth every 14 (  fourteen) days. 06/18/21   Megan Salon, MD    Current Outpatient Medications  Medication Sig Dispense Refill   Acetaminophen (TYLENOL PO) Take by mouth as needed (prior to infusion).     atorvastatin (LIPITOR) 20 MG tablet Take 1 tablet (20 mg total) by mouth daily. Instead of crestor (Patient taking differently: Take 20 mg by mouth every other day. Instead of crestor) 90 tablet 1   fluticasone (FLONASE) 50 MCG/ACT nasal spray SHAKE LIQUID AND USE 2 SPRAYS IN EACH NOSTRIL DAILY 48 g 0   Minoxidil (ROGAINE EX) Apply topically.     Na Sulfate-K Sulfate-Mg Sulf 17.5-3.13-1.6 GM/177ML SOLN Take 1 kit by mouth as directed. May use generic Suprep 354 mL 0   ipratropium (ATROVENT) 0.03 % nasal spray Place 2 sprays into both nostrils 2 (two) times daily as needed (post  nasal drip). 30 mL 5   Respiratory Therapy Supplies (FLUTTER) DEVI 1 each by Does not apply route 2 (two) times daily. 1 each 0   Vitamin D, Ergocalciferol, (DRISDOL) 1.25 MG (50000 UNIT) CAPS capsule Take 1 capsule (50,000 Units total) by mouth every 14 (fourteen) days. 6 capsule 4   Current Facility-Administered Medications  Medication Dose Route Frequency Provider Last Rate Last Admin   0.9 %  sodium chloride infusion  500 mL Intravenous Continuous Gevon Markus, Venia Minks, MD        Allergies as of 12/07/2021   (No Known Allergies)    Family History  Problem Relation Age of Onset   Diabetes Mother    Pulmonary fibrosis Mother        53 deceased   Hypertension Mother    Hypertension Father    Alzheimer's disease Father    Kidney failure Father    Diabetes Sister    Hypertension Brother    Diabetes Maternal Grandmother    Colon cancer Maternal Grandfather    Colon polyps Daughter    Thyroid disease Daughter    Psoriasis Daughter    Thyroid disease Daughter    Diabetes Son    Allergy (severe) Son    Depression Son    Esophageal cancer Neg Hx    Rectal cancer Neg Hx    Stomach cancer Neg Hx     Social History   Socioeconomic History   Marital status: Married    Spouse name: Not on file   Number of children: Not on file   Years of education: Not on file   Highest education level: Not on file  Occupational History   Not on file  Tobacco Use   Smoking status: Never    Passive exposure: Yes   Smokeless tobacco: Never  Vaping Use   Vaping Use: Never used  Substance and Sexual Activity   Alcohol use: Yes    Comment: occ   Drug use: No   Sexual activity: Not Currently    Partners: Male    Birth control/protection: Surgical, Post-menopausal    Comment: TAH  Other Topics Concern   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   hhof 2   G6 p4    Pet cats    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  Physical Exam: Vital signs in last 24 hours: BP 110/62    Pulse 74    Temp 97.8 F (36.6 C) (Temporal)    Ht _0  (1.626 m)    Wt 148 lb (67.1 kg)    LMP 11/29/1986    SpO2 95%    BMI 25.40 kg/m  General:   Alert, NAD Lungs:  Clear .   Heart:  Regular rate and rhythm Abdomen:  Soft, nontender and nondistended. Neuro/Psych:  Alert and cooperative. Normal mood and affect. A and O x 3  Reviewed labs, radiology imaging, old records and pertinent past GI work up  Patient is appropriate for planned procedure(s) and anesthesia in an ambulatory setting   K. Denzil Magnuson , MD [phone]

## 2021-12-07 NOTE — Op Note (Signed)
[facility] Patient Name: Micaela Stith Procedure Date: 12/07/2021 11:24 AM MRN: [id number] Endoscopist: Mauri Pole , MD Age: 76 Referring MD:  Date of Birth: 1946/07/08 Gender: Female Account #: [id number] Procedure:                Colonoscopy Indications:              High risk colon cancer surveillance: Personal                            history of colonic polyps, High risk colon cancer                            surveillance: Personal history of adenoma (10 mm or                            greater in size) Medicines:                Monitored Anesthesia Care Procedure:                Pre-Anesthesia Assessment:                           - Prior to the procedure, a History and Physical                            was performed, and patient medications and                            allergies were reviewed. The patient's tolerance of                            previous anesthesia was also reviewed. The risks                            and benefits of the procedure and the sedation                            options and risks were discussed with the patient.                            All questions were answered, and informed consent                            was obtained. Prior Anticoagulants: The patient has                            taken no previous anticoagulant or antiplatelet                            agents. ASA Grade Assessment: III - A patient with                            severe systemic disease. After reviewing the risks  and benefits, the patient was deemed in                            satisfactory condition to undergo the procedure.                           After obtaining informed consent, the colonoscope                            was passed under direct vision. Throughout the                            procedure, the patient's blood pressure, pulse, and                            oxygen saturations were monitored  continuously. The                            Olympus Colonoscope [id number] was introduced through                            the anus and advanced to the the cecum, identified                            by appendiceal orifice and ileocecal valve. The                            colonoscopy was performed without difficulty. The                            patient tolerated the procedure well. The quality                            of the bowel preparation was good. The ileocecal                            valve, appendiceal orifice, and rectum were                            photographed. Scope In: 11:37:06 AM Scope Out: 11:53:45 AM Scope Withdrawal Time: 0 hours 9 minutes 36 seconds  Total Procedure Duration: 0 hours 16 minutes 39 seconds  Findings:                 The perianal and digital rectal examinations were                            normal.                           Multiple small and large-mouthed diverticula were                            found in the sigmoid colon, descending colon,  transverse colon and ascending colon.                           Non-bleeding external and internal hemorrhoids were                            found during retroflexion. The hemorrhoids were                            medium-sized.                           The exam was otherwise without abnormality. Complications:            No immediate complications. Estimated Blood Loss:     Estimated blood loss was minimal. Impression:               - Severe diverticulosis in the sigmoid colon, in                            the descending colon, in the transverse colon and                            in the ascending colon.                           - Non-bleeding external and internal hemorrhoids.                           - The examination was otherwise normal.                           - No specimens collected. Recommendation:           - Patient has a contact number available for                             emergencies. The signs and symptoms of potential                            delayed complications were discussed with the                            patient. Return to normal activities tomorrow.                            Written discharge instructions were provided to the                            patient.                           - Resume previous diet.                           - Continue present medications.                           -  No repeat colonoscopy due to age.                           - Return to GI clinic PRN. Mauri Pole, MD 12/07/2021 11:57:23 AM This report has been signed electronically.

## 2021-12-07 NOTE — Patient Instructions (Signed)

## 2021-12-07 NOTE — Progress Notes (Signed)
Vss nad transferred to recovery

## 2021-12-08 DIAGNOSIS — H2513 Age-related nuclear cataract, bilateral: Secondary | ICD-10-CM | POA: Diagnosis not present

## 2021-12-08 DIAGNOSIS — H4323 Crystalline deposits in vitreous body, bilateral: Secondary | ICD-10-CM | POA: Diagnosis not present

## 2021-12-08 DIAGNOSIS — H31092 Other chorioretinal scars, left eye: Secondary | ICD-10-CM | POA: Diagnosis not present

## 2021-12-08 DIAGNOSIS — H43813 Vitreous degeneration, bilateral: Secondary | ICD-10-CM | POA: Diagnosis not present

## 2021-12-09 ENCOUNTER — Telehealth: Payer: Self-pay | Admitting: *Deleted

## 2021-12-09 NOTE — Telephone Encounter (Signed)
°  Follow up Call-  Call back number 12/07/2021  Post procedure Call Back phone  # [phone]  Permission to leave phone message Yes  Some recent data might be hidden     Patient questions:  Do you have a fever, pain , or abdominal swelling? No. Pain Score  0 *  Have you tolerated food without any problems? Yes.    Have you been able to return to your normal activities? Yes.    Do you have any questions about your discharge instructions: Diet   No. Medications  No. Follow up visit  No.  Do you have questions or concerns about your Care? No.  Actions: * If pain score is 4 or above: No action needed, pain <4.  Have you developed a fever since your procedure? no  2.   Have you had an respiratory symptoms (SOB or cough) since your procedure? no  3.   Have you tested positive for COVID 19 since your procedure no  4.   Have you had any family members/close contacts diagnosed with the COVID 19 since your procedure?  no   If yes to any of these questions please route to Joylene John, RN and Joella Prince, RN

## 2021-12-13 NOTE — Progress Notes (Signed)
Follow Up Note  RE: Leslie Reed MRN: [id number] DOB: [birth year] Date of Office Visit: 12/14/2021  Referring provider: Burnis Medin, MD Primary care provider: Burnis Medin, MD  Chief Complaint: Allergic Rhinitis  (Fine. Insurance is discontinuing the infusions so want to talk about alternatives. IGG has to be under 400. Company is advising that we do a peer to peer review to help the process along. )  History of Present Illness: I had the pleasure of seeing Leslie Reed for a follow up visit at the [facility] on 12/16/2021. She is a 76 y.o. 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 Her previous allergy office visit was on 08/12/2021 with Dr. Maudie Mercury. Today is a regular follow up visit.  Hypogammaglobulinemia Her insurance turned down her appeal for continuing Cuvitru twice now.  She was able to get 3 doses from [facility] meanwhile.   Patient was doing infusions every 4 weeks but then she tried to space it out to every 6 weeks while she was trying to get her insurance figured out.  She is due for infusion next week which would be 4 weeks from her last infusion. Patient has no more medication at home. She may be able to get one more dose from [facility].   Apparently [employer] is denying her IgG replacement therapy as her pretreatment IgG was not below 400.   She has been tolerating the infusions and her headaches improved since starting the infusions.   She has no upper/lower respiratory infections since the last visit which is great. She did have antibiotics for a skin tear.   Patient got flu shot and bivalent booster.   Bronchiectasis  No recent pulmonology visit. Coughing is about the same and has some phlegm with it.  Trying to use flutter valve 1-2 times a daily.  Chronic rhinitis Using Flonase 1 spray 1-2 times a day.  Ear issues are still there - no recent ENT evaluation.    Had normal colonoscopy since the last visit.   Assessment and Plan: Leslie Reed is a 76 y.o. female with: Hypogammaglobulinemia ([place]) Past history - Patient referred by pulmonology for low IgG and IgM. History of frequent respiratory infections, mild central bronchiectasis, mother who passed away from IPF. Up to date with COVID-19 vaccine, flu, pneumovax and prevnar.  2021 bloodwork diptheria, tetanus and s. Pneumonia titers adequate. Alpha 1 level and total complement level normal. No history of meningitis or blood clots. Arthralgias and headaches improved with Cuvitru 20g every 4 weeks. Interim history - issues with her insurance no longer covering Cuvitru. No respiratory infections since the last visit. Up to date with flu and Covid-19 bivalent booster. Last infusion was 3 weeks ago.  Continue Cuvitru infusions every 4 weeks.  Will set up a peer to peer with [employer]. Patient not only has hypogammaglobulinemia but also has bronchiectasis which predisposes her to more frequent respiratory infections that can worsen her bronchiectasis and cause long term complications with her respiratory status.  Also recommend that patient write a letter to [employer].  Keep track of infections and antibiotics use.  Get bloodwork - the day before the infusion.  Bronchiectasis without complication ([place]) Past history - 2022 spirometry was normal. Interim history - coughing unchanged. Today's spirometry was normal.  Follow up with pulmonology and their recommendations. Continue to use the flutter valve daily.  Nonallergic rhinitis Past history - Perennial rhinitis symptoms and using Vick's vapor  rub. 2021 skin testing was negative to environmental allergies.  Interim history - stable. Use Atrovent (ipratropium) 0.03% 1-2 sprays per nostril twice a day as needed for runny nose/drainage. Use Flonase (fluticasone) nasal spray 1 spray per nostril twice a day for nasal congestion.  Nasal saline spray (i.e.,  Simply Saline) or nasal saline lavage (i.e., NeilMed) is recommended as needed and prior to medicated nasal sprays. Follow up with ENT for the ear issues.  Return in about 4 months (around 04/13/2022).  No orders of the defined types were placed in this encounter.  Lab Orders         Comprehensive metabolic panel         CBC with Differential/Platelet         IgG, IgA, IgM         Sedimentation rate      Diagnostics: Spirometry:  Tracings reviewed. Her effort: Good reproducible efforts. FVC: 2.43L FEV1: 2.00L, 97% predicted FEV1/FVC ratio: 82% Interpretation: Spirometry consistent with normal pattern.  Please see scanned spirometry results for details.  Medication List:  Current Outpatient Medications  Medication Sig Dispense Refill   Acetaminophen (TYLENOL PO) Take by mouth as needed (prior to infusion).     atorvastatin (LIPITOR) 20 MG tablet Take 1 tablet (20 mg total) by mouth daily. Instead of crestor (Patient taking differently: Take 20 mg by mouth every other day. Instead of crestor) 90 tablet 1   fluticasone (FLONASE) 50 MCG/ACT nasal spray SHAKE LIQUID AND USE 2 SPRAYS IN EACH NOSTRIL DAILY 48 g 0   ipratropium (ATROVENT) 0.03 % nasal spray Place 2 sprays into both nostrils 2 (two) times daily as needed (post nasal drip). 30 mL 5   Minoxidil (ROGAINE EX) Apply topically.     Respiratory Therapy Supplies (FLUTTER) DEVI 1 each by Does not apply route 2 (two) times daily. 1 each 0   Vitamin D, Ergocalciferol, (DRISDOL) 1.25 MG (50000 UNIT) CAPS capsule Take 1 capsule (50,000 Units total) by mouth every 14 (fourteen) days. 6 capsule 4   Current Facility-Administered Medications  Medication Dose Route Frequency Provider Last Rate Last Admin   0.9 %  sodium chloride infusion  500 mL Intravenous Continuous Nandigam, Kavitha V, MD       Allergies: No Known Allergies I reviewed her past medical history, social history, family history, and environmental history and no  significant changes have been reported from her previous visit.  Review of Systems  Constitutional:  Negative for appetite change, chills, fever and unexpected weight change.  HENT:  Positive for congestion. Negative for ear pain and rhinorrhea.   Eyes:  Negative for itching.  Respiratory:  Positive for cough. Negative for chest tightness, shortness of breath and wheezing.   Cardiovascular:  Negative for chest pain.  Gastrointestinal:  Negative for abdominal pain.  Genitourinary:  Negative for difficulty urinating.  Musculoskeletal:  Positive for arthralgias.  Allergic/Immunologic: Negative for environmental allergies and food allergies.  Neurological:  Positive for headaches.   Objective: BP 138/80    Pulse 88    Temp 98.1 F (36.7 C) (Temporal)    Resp 20    Ht 5' 3.58" (1.615 m)    Wt 148 lb 6.4 oz (67.3 kg)    LMP 11/29/1986    SpO2 97%    BMI 25.81 kg/m  Body mass index is 25.81 kg/m. Physical Exam Vitals and nursing note reviewed.  Constitutional:      Appearance: Normal appearance. She is well-developed.  HENT:  Head: Normocephalic and atraumatic.     Right Ear: Tympanic membrane and external ear normal.     Left Ear: Tympanic membrane and external ear normal.     Nose: Nose normal. No congestion or rhinorrhea.     Mouth/Throat:     Mouth: Mucous membranes are moist.     Pharynx: Oropharynx is clear.  Eyes:     Conjunctiva/sclera: Conjunctivae normal.  Cardiovascular:     Rate and Rhythm: Normal rate and regular rhythm.     Heart sounds: Normal heart sounds. No murmur heard.   No friction rub. No gallop.  Pulmonary:     Effort: Pulmonary effort is normal.     Breath sounds: Normal breath sounds. No wheezing, rhonchi or rales.  Musculoskeletal:     Cervical back: Neck supple.  Skin:    General: Skin is warm.  Neurological:     Mental Status: She is alert and oriented to person, place, and time.  Psychiatric:        Mood and Affect: Mood normal.         Behavior: Behavior normal.   Previous notes and tests were reviewed. The plan was reviewed with the patient/family, and all questions/concerned were addressed.  It was my pleasure to see Elvena today and participate in her care. Please feel free to contact me with any questions or concerns.  Sincerely,  Rexene Alberts, DO Allergy & Immunology  [facility] office: [place] office: [phone]

## 2021-12-14 ENCOUNTER — Ambulatory Visit (INDEPENDENT_AMBULATORY_CARE_PROVIDER_SITE_OTHER): Payer: BC Managed Care – PPO | Admitting: Allergy

## 2021-12-14 ENCOUNTER — Encounter: Payer: Self-pay | Admitting: Allergy

## 2021-12-14 ENCOUNTER — Other Ambulatory Visit: Payer: Self-pay

## 2021-12-14 VITALS — BP 138/80 | HR 88 | Temp 98.1°F | Resp 20 | Ht 63.58 in | Wt 148.4 lb

## 2021-12-14 DIAGNOSIS — D801 Nonfamilial hypogammaglobulinemia: Secondary | ICD-10-CM | POA: Diagnosis not present

## 2021-12-14 DIAGNOSIS — R519 Headache, unspecified: Secondary | ICD-10-CM

## 2021-12-14 DIAGNOSIS — J310 Chronic rhinitis: Secondary | ICD-10-CM

## 2021-12-14 DIAGNOSIS — J479 Bronchiectasis, uncomplicated: Secondary | ICD-10-CM | POA: Diagnosis not present

## 2021-12-14 NOTE — Patient Instructions (Addendum)
Low IgG:  Continue cuvitru infusions every 4 weeks.  Will call [employer] regarding the infusions. I recommend that you write a letter to them as well.  Keep track of infections and antibiotics use.  Get bloodwork - the day before the infusion. We are ordering labs, so please allow 1-2 weeks for the results to come back. With the newly implemented Cures Act, the labs might be visible to you at the same time that they become visible to me. However, I will not address the results until all of the results are back, so please be patient.   Rhinitis:  Use Atrovent (ipratropium) 0.03% 1-2 sprays per nostril twice a day as needed for runny nose/drainage. Use Flonase (fluticasone) nasal spray 1 spray per nostril twice a day for nasal congestion.  Nasal saline spray (i.e., Simply Saline) or nasal saline lavage (i.e., NeilMed) is recommended as needed and prior to medicated nasal sprays.  Follow up with pulmonology and their recommendations. Continue to use the flutter valve daily.  Ear Follow up with ENT if you have issues - Dr. Benjamine Mola.  Follow up in 4 months or sooner if needed. Follow up with PCP regarding the headaches.  Follow up with pulmonology.

## 2021-12-15 DIAGNOSIS — L988 Other specified disorders of the skin and subcutaneous tissue: Secondary | ICD-10-CM | POA: Diagnosis not present

## 2021-12-15 DIAGNOSIS — C44519 Basal cell carcinoma of skin of other part of trunk: Secondary | ICD-10-CM | POA: Diagnosis not present

## 2021-12-16 ENCOUNTER — Encounter: Payer: Self-pay | Admitting: Allergy

## 2021-12-16 NOTE — Assessment & Plan Note (Addendum)
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 History of frequent respiratory infections, mild central bronchiectasis, mother who passed away from IPF. Up to date with COVID-19 vaccine, flu, pneumovax and prevnar.  2021 bloodwork diptheria, tetanus and s. Pneumonia titers adequate. Alpha 1 level and total complement level normal. No history of meningitis or blood clots. Arthralgias and headaches improved with Cuvitru 20g every 4 weeks. Interim history - issues with her insurance no longer covering Cuvitru. No respiratory infections since the last visit. Up to date with flu and Covid-19 bivalent booster. Last infusion was 3 weeks ago.   Continue Cuvitru infusions every 4 weeks.   Will set up a peer to peer with [employer].  Patient not only has hypogammaglobulinemia but also has bronchiectasis which predisposes her to more frequent respiratory infections that can worsen her bronchiectasis and cause long term complications with her respiratory status.   Also recommend that patient write a letter to [employer].   Keep track of infections and antibiotics use.   Get bloodwork - the day before the infusion.

## 2021-12-16 NOTE — Assessment & Plan Note (Signed)
Past history - 2022 spirometry was normal. Interim history - coughing unchanged.  Today's spirometry was normal.   Follow up with pulmonology and their recommendations.  Continue to use the flutter valve daily.

## 2021-12-16 NOTE — Assessment & Plan Note (Signed)
Past history - Perennial rhinitis symptoms and using Vick's vapor rub. 2021 skin testing was negative to environmental allergies.  Interim history - stable.  Use Atrovent (ipratropium) 0.03% 1-2 sprays per nostril twice a day as needed for runny nose/drainage.  Use Flonase (fluticasone) nasal spray 1 spray per nostril twice a day for nasal congestion.   Nasal saline spray (i.e., Simply Saline) or nasal saline lavage (i.e., NeilMed) is recommended as needed and prior to medicated nasal sprays.  Follow up with ENT for the ear issues.

## 2021-12-18 ENCOUNTER — Telehealth: Payer: Self-pay | Admitting: *Deleted

## 2021-12-18 NOTE — Telephone Encounter (Signed)
-----   Message from Garnet Sierras, DO sent at 12/14/2021  1:31 PM EST ----- Lynelle Smoke, Can you set up a peer to peer with her insurance company for me? I can do Mondays to Thursdays at 8AM or 12:30PM or 1PM or 4:30PM. Let me know. Thanks!

## 2021-12-18 NOTE — Telephone Encounter (Signed)
I called and spoke to [facility] and was advised that peer to peer would not change the status of denial only a second level appeal which the info was sent with denial was the only route to change decision. Please advise

## 2021-12-22 NOTE — Telephone Encounter (Signed)
Spoke with patient. She is not sure what the lowest IgG she had but according to our records it was 519.   She said that the person at [facility] will call Tammy regarding this.  She got her 4th free dose in the mail. She will get her through bloodwork tomorrow and infuse on Thursday.  After the last dose, will hold off Hyquvia as insurance does not seem to want to approve this given her lab results.  Will follow up in May and recheck levels.  Advised patient to keep meticulous records of any infections, breathing issues.   She is considering switching to Medicare in the year 2024.

## 2021-12-23 DIAGNOSIS — D801 Nonfamilial hypogammaglobulinemia: Secondary | ICD-10-CM | POA: Diagnosis not present

## 2021-12-24 LAB — CBC WITH DIFFERENTIAL/PLATELET
Basophils Absolute: 0.1 10*3/uL (ref 0.0–0.2)
Basos: 1 %
EOS (ABSOLUTE): 0.1 10*3/uL (ref 0.0–0.4)
Eos: 2 %
Hematocrit: 42.2 % (ref 34.0–46.6)
Hemoglobin: 14.2 g/dL (ref 11.1–15.9)
Immature Grans (Abs): 0 10*3/uL (ref 0.0–0.1)
Immature Granulocytes: 0 %
Lymphocytes Absolute: 1.8 10*3/uL (ref 0.7–3.1)
Lymphs: 34 %
MCH: 30.7 pg (ref 26.6–33.0)
MCHC: 33.6 g/dL (ref 31.5–35.7)
MCV: 91 fL (ref 79–97)
Monocytes Absolute: 0.5 10*3/uL (ref 0.1–0.9)
Monocytes: 10 %
Neutrophils Absolute: 2.8 10*3/uL (ref 1.4–7.0)
Neutrophils: 53 %
Platelets: 300 10*3/uL (ref 150–450)
RBC: 4.63 x10E6/uL (ref 3.77–5.28)
RDW: 12.9 % (ref 11.7–15.4)
WBC: 5.2 10*3/uL (ref 3.4–10.8)

## 2021-12-24 LAB — COMPREHENSIVE METABOLIC PANEL
ALT: 24 IU/L (ref 0–32)
AST: 21 IU/L (ref 0–40)
Albumin/Globulin Ratio: 2.2 (ref 1.2–2.2)
Albumin: 4.6 g/dL (ref 3.7–4.7)
Alkaline Phosphatase: 83 IU/L (ref 44–121)
BUN/Creatinine Ratio: 20 (ref 12–28)
BUN: 14 mg/dL (ref 8–27)
Bilirubin Total: 0.4 mg/dL (ref 0.0–1.2)
CO2: 23 mmol/L (ref 20–29)
Calcium: 9.4 mg/dL (ref 8.7–10.3)
Chloride: 104 mmol/L (ref 96–106)
Creatinine, Ser: 0.71 mg/dL (ref 0.57–1.00)
Globulin, Total: 2.1 g/dL (ref 1.5–4.5)
Glucose: 91 mg/dL (ref 70–99)
Potassium: 4.6 mmol/L (ref 3.5–5.2)
Sodium: 143 mmol/L (ref 134–144)
Total Protein: 6.7 g/dL (ref 6.0–8.5)
eGFR: 89 mL/min/{1.73_m2} (ref >59)

## 2021-12-24 LAB — IGG, IGA, IGM
IgA/Immunoglobulin A, Serum: 69 mg/dL (ref 64–422)
IgG (Immunoglobin G), Serum: 925 mg/dL (ref 586–1602)
IgM (Immunoglobulin M), Srm: 31 mg/dL (ref 26–217)

## 2021-12-24 LAB — SEDIMENTATION RATE: Sed Rate: 3 mm/hr (ref 0–40)

## 2022-02-01 ENCOUNTER — Other Ambulatory Visit: Payer: Self-pay | Admitting: Internal Medicine

## 2022-02-08 ENCOUNTER — Telehealth: Payer: Self-pay | Admitting: Internal Medicine

## 2022-02-08 NOTE — Telephone Encounter (Signed)
Pt is calling and does have lipid panel that needs to be sch prior to her cpe sch for 02-15-22. Pt would like to know if she can have the additional blood work prior to cpe ?

## 2022-02-08 NOTE — Telephone Encounter (Signed)
Please advise 

## 2022-02-08 NOTE — Telephone Encounter (Addendum)
Spoke with patient and patient is not requesting any other labs beside lipid panel patient did state she has stopped taking any immunoglobulins medication 6 weeks ago  because insurance will not pay for it.   ?

## 2022-02-08 NOTE — Telephone Encounter (Signed)
Please advise which lab you are interested in and why over and above the lipid panel. ?CBC liver chemistry and immunoglobulins done at the end of January 2023 that were normal. ?Thus the lipid panel is the main lab that has not been done yet ?I do not see a need for repeating any lab unless unless there is s new problem  that has occurred or her hematologist advises further blood work.  ?Please advise  WP ?Thank

## 2022-02-10 ENCOUNTER — Other Ambulatory Visit (INDEPENDENT_AMBULATORY_CARE_PROVIDER_SITE_OTHER): Payer: BC Managed Care – PPO

## 2022-02-10 DIAGNOSIS — Z79899 Other long term (current) drug therapy: Secondary | ICD-10-CM

## 2022-02-10 DIAGNOSIS — E785 Hyperlipidemia, unspecified: Secondary | ICD-10-CM | POA: Diagnosis not present

## 2022-02-10 LAB — LIPID PANEL
Cholesterol: 143 mg/dL (ref 0–200)
HDL: 52.5 mg/dL (ref >39.00)
LDL Cholesterol: 76 mg/dL (ref 0–99)
NonHDL: 90.1
Total CHOL/HDL Ratio: 3
Triglycerides: 73 mg/dL (ref 0.0–149.0)
VLDL: 14.6 mg/dL (ref 0.0–40.0)

## 2022-02-14 NOTE — Progress Notes (Signed)
? ?Chief Complaint  ?Patient presents with  ? Annual Exam  ?  Not fasting ?  ? ? ?HPI: ?Patient  Leslie Reed  76 y.o. comes in today for Preventive Health Care visit  ?Now off   immunoglobulins   insurance   denied.    So fllowing.  By her immunologist. ? ?Due shingrix   2#  ?Colon every 5 year.   Now Nandigaham.     ? High risk parents did not pay for it as a screening test dealing with that now but doing okay physically. 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 ?Neck bothersome when she has to look up painting and other activities but no radiation ? ?Health Maintenance  ?Topic Date Due  ? INFLUENZA VACCINE  08/18/2022 (Originally 06/29/2021)  ? COVID-19 Vaccine (3 - Mixed Product risk series) 08/18/2022 (Originally 02/25/2021)  ? Zoster Vaccines- Shingrix (1 of 2) 08/18/2022 (Originally 03/24/1965)  ? MAMMOGRAM  08/21/2023  ? TETANUS/TDAP  11/21/2031  ? Pneumonia Vaccine 20+ Years old  Completed  ? DEXA SCAN  Completed  ? Hepatitis C Screening  Completed  ? HPV VACCINES  Aged Out  ? COLONOSCOPY (Pts 45-50yr Insurance coverage will need to be confirmed)  Discontinued  ? ?Health Maintenance Review ?LIFESTYLE:  ?Exercise:  not arobic  ?Tobacco/ETS: n ?Alcohol:   no ?Sugar beverages:  diet  ?Sleep:  4-6 hours    cpap may make harder  dr DBeacher May.   ?Drug use: no ?HH of  3   cat  son has medical issues endocrine [facility] ?Work:  lots of working   painting   physical     many days  ? ? ?ROS:  ?REST of 12 system review negative except as per HPI no current chest pain new shortness of breath fevers. ? ? ?Past Medical History:  ?Diagnosis Date  ? Allergic rhinitis   ? Allergy   ? Arthritis   ? hands, lower back  ? Asthma   ? Bronchitis   ? uses inhaler prn  ? Cataract   ? just being monitored - no surgery  ? Colitis 8/04  ?  Diverticulitis   ? GERD (gastroesophageal reflux disease)   ? diet controlled, no meds  ? History of migraine headaches   ? Hx of colonic polyp   ? Hx of varicella   ? Hyperlipidemia   ? Insomnia   ? 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 Mitral valve prolapse   ? Osteopenia   ? Scoliosis 2005  ? Sleep apnea   ? Tick bite 2014  ? STARI-antibiotics x 5 weeks  ? Tracheobronchopathia-osteochondroplastica   ? ? ?Past Surgical History:  ?Procedure Laterality Date  ? ABDOMINAL HYSTERECTOMY    ? partial  for pain no cancer    ? ADENOIDECTOMY    ? APPENDECTOMY    ? BREAST BIOPSY Right   ? x 2   ? BRONCHIAL WASHINGS  04/22/2020  ? Procedure: BRONCHIAL lavage;  Surgeon: CJulian Hy DO;  Location: [facility];  Service: Endoscopy;;  ? CESAREAN SECTION    ? x4  ? CHOLECYSTECTOMY    ? colon polyps    ? COLONOSCOPY  09/09/2016  ? Dr.Nandigam  ? DILATION AND CURETTAGE OF UTERUS    ? x 2  ? TONSILLECTOMY    ?  VIDEO BRONCHOSCOPY N/A 04/22/2020  ? Procedure: VIDEO BRONCHOSCOPY WITHOUT FLUORO;  Surgeon: Julian Hy, DO;  Location: [facility];  Service: Endoscopy;  Laterality: N/A;  ? WISDOM TOOTH EXTRACTION    ? ? ?Family History  ?Problem Relation Age of Onset  ? Diabetes Mother   ? Pulmonary fibrosis Mother   ?     11 deceased  ? Hypertension Mother   ? Hypertension Father   ? Alzheimer's disease Father   ? Kidney failure Father   ? Diabetes Sister   ? Hypertension Brother   ? Diabetes Maternal Grandmother   ? Colon cancer Maternal Grandfather   ? Colon polyps Daughter   ? Thyroid disease Daughter   ? Psoriasis Daughter   ? Thyroid disease Daughter   ? Diabetes Son   ? Allergy (severe) Son   ? Depression Son   ? Esophageal cancer Neg Hx   ? Rectal cancer Neg Hx   ? Stomach cancer Neg Hx   ? ? ?Social History  ? ?Socioeconomic History  ? Marital status: Married  ?  Spouse name: Not on file  ? Number of children: Not on file  ? Years of education: Not on file  ? Highest education level: Not on file  ?Occupational History  ? Not on  file  ?Tobacco Use  ? Smoking status: Never  ?  Passive exposure: Yes  ? Smokeless tobacco: Never  ?Vaping Use  ? Vaping Use: Never used  ?Substance and Sexual Activity  ? Alcohol use: Yes  ?  Comment: occ  ? Drug use: No  ? Sexual activity: Not Currently  ?  Partners: Male  ?  Birth control/protection: Surgical, Post-menopausal  ?  Comment: TAH  ?Other Topics Concern  ? Not on file  ?Social History Narrative  ? 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 hhof 2  ? G6 p4   ? Pet cats   ? 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 [address] 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  ? Neg td   ? Sleep 4-6 interrupted   ? Family is WISC father with alzheimers  ? ?Social Determinants of Health  ? ?Financial Resource Strain: Not on file  ?Food Insecurity: Not on file  ?Transportation Needs: Not on file  ?Physical Activity: Not on file  ?Stress: Not on file  ?Social Connections: Not on file  ? ? ?Outpatient Medications Prior to Visit  ?Medication Sig Dispense Refill  ? atorvastatin (LIPITOR) 20 MG tablet TAKE 1 TABLET BY MOUTH EVERY DAY. TAKE INSTEAD OF CRESTOR. 90 tablet 1  ? fluticasone (FLONASE) 50 MCG/ACT nasal spray SHAKE LIQUID AND USE 2 SPRAYS IN EACH NOSTRIL DAILY 48 g 0  ? ipratropium (ATROVENT) 0.03 % nasal spray Place 2 sprays into both nostrils 2 (two) times daily as needed (post nasal drip). 30 mL 5  ? Minoxidil (ROGAINE EX) Apply topically.    ? Respiratory Therapy Supplies (FLUTTER) DEVI 1 each by Does not apply route 2 (two) times daily. 1 each 0  ? Vitamin D, Ergocalciferol, (DRISDOL) 1.25 MG (50000 UNIT) CAPS capsule Take 1 capsule (50,000 Units total) by mouth every 14 (fourteen) days. 6 capsule 4  ? Acetaminophen (TYLENOL PO) Take by mouth as needed (prior to infusion). (Patient not taking: Reported on 02/15/2022)    ? ?Facility-Administered Medications Prior to Visit  ?Medication Dose Route Frequency Provider Last Rate Last Admin  ? 0.9 %  sodium chloride infusion  500 mL Intravenous Continuous Nandigam, Venia Minks, MD       ? ? ? ?  EXAM: ? ?BP 116/70 (BP Location: Left Arm, Patient Position: Sitting, Cuff Size: Normal)   Pulse 73   Temp 97.9 ?F (36.6 ?C) (Oral)   Ht '5\' 4"'$  (1.626 m)   Wt 146 lb (66.2 kg)   LMP 11/29/1986   SpO2 98%   BMI 25.06 kg/m?  ? ?Body mass index is 25.06 kg/m?. ?Wt Readings from Last 3 Encounters:  ?02/15/22 146 lb (66.2 kg)  ?12/14/21 148 lb 6.4 oz (67.3 kg)  ?12/07/21 148 lb (67.1 kg)  ? ? ?Physical Exam: ?Vital signs reviewed ?LGX:QJJH is a well-developed well-nourished alert cooperative    who appearsr stated age in no acute distress.  ?HEENT: normocephalic atraumatic , Eyes: PERRL EOM's full, conjunctiva clear, Nares: paten,t no deformity discharge or tenderness., Ears: no deformity EAC's clear TMs with normal landmarks. Mouth: masked  ?NECK: supple without masses, thyromegaly or bruits. ?CHEST/PULM:  Clear to auscultation and percussion breath sounds equal no wheeze , rales or rhonchi. No chest wall deformities or tenderness. ?Breast: normal by inspection . No dimpling, discharge, masses, tenderness or discharge . ?CV: PMI is nondisplaced, S1 S2 no gallops, murmurs, rubs. Peripheral pulses are full without delay.No JVD .  ?ABDOMEN: Bowel sounds normal nontender  No guard or rebound, no hepato splenomegal no CVA tenderness.  No hernia. ?Extremtities:  No clubbing cyanosis or edema, no acute joint swelling or redness no focal atrophy changes of osteoarthritis DJD of hands. ?NEURO:  Oriented x3, cranial nerves 3-12 appear to be intact, no obvious focal weakness,gait within normal limits no abnormal reflexes or asymmetrical ?SKIN: No acute rashes normal turgor, color, no bruising or petechiae. ?PSYCH: Oriented, good eye contact, no obvious depression anxiety, cognition and judgment appear normal. ?LN: no cervical axillarydenopathy ? ?Lab Results  ?Component Value Date  ? WBC 5.2 12/23/2021  ? HGB 14.2 12/23/2021  ? HCT 42.2 12/23/2021  ? PLT 300 12/23/2021  ? GLUCOSE 91 12/23/2021  ? CHOL 143 02/10/2022   ? TRIG 73.0 02/10/2022  ? HDL 52.50 02/10/2022  ? LDLDIRECT 138.0 10/13/2011  ? [place] 76 02/10/2022  ? ALT 24 12/23/2021  ? AST 21 12/23/2021  ? NA 143 12/23/2021  ? K 4.6 12/23/2021  ? CL 104 12/23/2021  ?

## 2022-02-14 NOTE — Progress Notes (Signed)
Discuss at upcoming visit lipid result is stable

## 2022-02-15 ENCOUNTER — Ambulatory Visit (INDEPENDENT_AMBULATORY_CARE_PROVIDER_SITE_OTHER): Payer: BC Managed Care – PPO | Admitting: Internal Medicine

## 2022-02-15 ENCOUNTER — Encounter: Payer: Self-pay | Admitting: Internal Medicine

## 2022-02-15 VITALS — BP 116/70 | HR 73 | Temp 97.9°F | Ht 64.0 in | Wt 146.0 lb

## 2022-02-15 DIAGNOSIS — Z0000 Encounter for general adult medical examination without abnormal findings: Secondary | ICD-10-CM | POA: Diagnosis not present

## 2022-02-15 DIAGNOSIS — D801 Nonfamilial hypogammaglobulinemia: Secondary | ICD-10-CM

## 2022-02-15 DIAGNOSIS — E785 Hyperlipidemia, unspecified: Secondary | ICD-10-CM

## 2022-02-15 DIAGNOSIS — M159 Polyosteoarthritis, unspecified: Secondary | ICD-10-CM

## 2022-02-15 DIAGNOSIS — Z79899 Other long term (current) drug therapy: Secondary | ICD-10-CM

## 2022-02-15 MED ORDER — ATORVASTATIN CALCIUM 20 MG PO TABS
20.0000 mg | ORAL_TABLET | ORAL | 3 refills | Status: DC
Start: 2022-02-15 — End: 2022-10-07

## 2022-02-15 NOTE — Patient Instructions (Signed)
Good to see you today . ?Continue lifestyle intervention healthy eating and exercise .    ? ?Mediterranean eating  . Can check  american heart other for  information on dietary links . ? ?Go ahead and get second  shingrix .  ? ? ?Cholesterol level is acceptable  goal of 70 range .  ? ? ?

## 2022-04-14 ENCOUNTER — Ambulatory Visit: Payer: BC Managed Care – PPO | Admitting: Allergy

## 2022-04-20 NOTE — Progress Notes (Unsigned)
Follow Up Note  RE: Leslie Reed MRN: [id number] DOB: 1946-06-29 Date of Office Visit: 04/21/2022  Referring provider: Burnis Medin, MD Primary care provider: Burnis Medin, MD  Chief Complaint: hypogammaglobulinemia [facility]) (4 mth f/u - Everything is going  okay)  History of Present Illness: I had the pleasure of seeing Leslie Reed for a follow up visit at the [facility] on 04/21/2022. She is a 76 y.o. female, who is being followed for hypogammaglobulinemia, bronchiectasis, nonallergic rhinitis. Her previous allergy office visit was on 12/14/2021 with Dr. Maudie Mercury. Today is a regular follow up visit.  Hypogammaglobulinemia Patient had last Cuvitru injection in February. No infections/antibiotics since the last visit.  Patient is very active on a daily basis but feels tired all the time. She is not sure if this is due to her age or her doing too much.  She also has the stress of caring for her son who has been having issues with his pancreas.    Bronchiectasis Coughing is unchanged.  Does not use flutter valve daily. Has not seen pulmonology in awhile.    Nonallergic rhinitis Use Flonase and Atrovent prn with some benefit.   Still recommended that patient change her insurance to Medicare just in case she will need the infusions in the future.   Assessment and Plan: Ivionna is a 76 y.o. female with: Hypogammaglobulinemia [facility]) Past history - Patient referred by pulmonology for low IgG and IgM. History of frequent respiratory infections, mild central bronchiectasis, mother who passed away from IPF. Up to date with COVID-19 vaccine, flu, pneumovax and prevnar.  2021 bloodwork diptheria, tetanus and s. Pneumonia titers adequate. Alpha 1 level and total complement level normal. No history of meningitis or blood clots. Arthralgias and headaches improved with Cuvitru 20g every 4 weeks. Interim history - last Cuvitru infusion in February. No infections/antibiotics  since last visit.   Keep track of infections and antibiotics use.  Get bloodwork.  Bronchiectasis without complication ([place]) Past history - 2022 spirometry was normal. Interim history - coughing unchanged, has not seen pulm recently. Today's spirometry was normal.  Follow up with pulmonology and their recommendations. Continue to use the flutter valve daily.  Nonallergic rhinitis Past history - Perennial rhinitis symptoms and using Vick's vapor rub. 2021 skin testing was negative to environmental allergies.  Interim history - controlled. Use Atrovent (ipratropium) 0.03% 1-2 sprays per nostril twice a day as needed for runny nose/drainage. Use Flonase (fluticasone) nasal spray 1 spray per nostril twice a day for nasal congestion.  Nasal saline spray (i.e., Simply Saline) or nasal saline lavage (i.e., NeilMed) is recommended as needed and prior to medicated nasal sprays.  Return in about 4 months (around 08/22/2022).  No orders of the defined types were placed in this encounter.  Lab Orders         Comprehensive metabolic panel         CBC with Differential/Platelet         Sedimentation rate         IgG, IgA, IgM      Diagnostics: Spirometry:  Tracings reviewed. Her effort: Good reproducible efforts. FVC: 2.68L FEV1: 2.26L, 106% predicted FEV1/FVC ratio: 84% Interpretation: Spirometry consistent with normal pattern.  Please see scanned spirometry results for details.  Medication List:  Current Outpatient Medications  Medication Sig Dispense Refill   atorvastatin (LIPITOR) 20 MG tablet Take 1 tablet (20 mg total) by mouth every other day. 45 tablet 3   fluticasone (  FLONASE) 50 MCG/ACT nasal spray SHAKE LIQUID AND USE 2 SPRAYS IN EACH NOSTRIL DAILY 48 g 0   ipratropium (ATROVENT) 0.03 % nasal spray Place 2 sprays into both nostrils 2 (two) times daily as needed (post nasal drip). 30 mL 5   Minoxidil (ROGAINE EX) Apply topically.     Respiratory Therapy Supplies (FLUTTER) DEVI  1 each by Does not apply route 2 (two) times daily. 1 each 0   Vitamin D, Ergocalciferol, (DRISDOL) 1.25 MG (50000 UNIT) CAPS capsule Take 1 capsule (50,000 Units total) by mouth every 14 (fourteen) days. 6 capsule 4   Current Facility-Administered Medications  Medication Dose Route Frequency Provider Last Rate Last Admin   0.9 %  sodium chloride infusion  500 mL Intravenous Continuous Nandigam, Kavitha V, MD       Allergies: No Known Allergies I reviewed her past medical history, social history, family history, and environmental history and no significant changes have been reported from her previous visit.  Review of Systems  Constitutional:  Negative for appetite change, chills, fever and unexpected weight change.  HENT:  Positive for congestion. Negative for ear pain and rhinorrhea.   Eyes:  Negative for itching.  Respiratory:  Positive for cough. Negative for chest tightness, shortness of breath and wheezing.   Cardiovascular:  Negative for chest pain.  Gastrointestinal:  Negative for abdominal pain.  Genitourinary:  Negative for difficulty urinating.  Musculoskeletal:  Positive for arthralgias.  Allergic/Immunologic: Negative for environmental allergies and food allergies.  Neurological:  Positive for headaches.   Objective: BP 120/68   Pulse 81   Temp 98.3 F (36.8 C)   Resp 16   Ht '5\' 4"'$  (1.626 m)   Wt 148 lb 3.2 oz (67.2 kg)   LMP 11/29/1986   SpO2 96%   BMI 25.44 kg/m  Body mass index is 25.44 kg/m. Physical Exam Vitals and nursing note reviewed.  Constitutional:      Appearance: Normal appearance. She is well-developed.  HENT:     Head: Normocephalic and atraumatic.     Right Ear: Tympanic membrane and external ear normal.     Left Ear: Tympanic membrane and external ear normal.     Nose: Nose normal. No congestion or rhinorrhea.     Mouth/Throat:     Mouth: Mucous membranes are moist.     Pharynx: Oropharynx is clear.  Eyes:     Conjunctiva/sclera:  Conjunctivae normal.  Cardiovascular:     Rate and Rhythm: Normal rate and regular rhythm.     Heart sounds: Normal heart sounds. No murmur heard.   No friction rub. No gallop.  Pulmonary:     Effort: Pulmonary effort is normal.     Breath sounds: Normal breath sounds. No wheezing, rhonchi or rales.  Musculoskeletal:     Cervical back: Neck supple.  Skin:    General: Skin is warm.  Neurological:     Mental Status: She is alert and oriented to person, place, and time.  Psychiatric:        Mood and Affect: Mood normal.        Behavior: Behavior normal.   Previous notes and tests were reviewed. The plan was reviewed with the patient/family, and all questions/concerned were addressed.  It was my pleasure to see Leslie Reed today and participate in her care. Please feel free to contact me with any questions or concerns.  Sincerely,  Rexene Alberts, DO Allergy & Immunology  [facility] office: [place]  [place] office: [phone]

## 2022-04-21 ENCOUNTER — Encounter: Payer: Self-pay | Admitting: Allergy

## 2022-04-21 ENCOUNTER — Ambulatory Visit (INDEPENDENT_AMBULATORY_CARE_PROVIDER_SITE_OTHER): Payer: BC Managed Care – PPO | Admitting: Allergy

## 2022-04-21 VITALS — BP 120/68 | HR 81 | Temp 98.3°F | Resp 16 | Ht 64.0 in | Wt 148.2 lb

## 2022-04-21 DIAGNOSIS — D801 Nonfamilial hypogammaglobulinemia: Secondary | ICD-10-CM

## 2022-04-21 DIAGNOSIS — J310 Chronic rhinitis: Secondary | ICD-10-CM

## 2022-04-21 DIAGNOSIS — J479 Bronchiectasis, uncomplicated: Secondary | ICD-10-CM

## 2022-04-21 NOTE — Assessment & Plan Note (Signed)
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 History of frequent respiratory infections, mild central bronchiectasis, mother who passed away from IPF. Up to date with COVID-19 vaccine, flu, pneumovax and prevnar.  2021 bloodwork diptheria, tetanus and s. Pneumonia titers adequate. Alpha 1 level and total complement level normal. No history of meningitis or blood clots. Arthralgias and headaches improved with Cuvitru 20g every 4 weeks. Interim history - last Cuvitru infusion in February. No infections/antibiotics since last visit.    Keep track of infections and antibiotics use.  . Get bloodwork.

## 2022-04-21 NOTE — Assessment & Plan Note (Signed)
Past history - Perennial rhinitis symptoms and using Vick's vapor rub. 2021 skin testing was negative to environmental allergies.  Interim history - controlled. . Use Atrovent (ipratropium) 0.03% 1-2 sprays per nostril twice a day as needed for runny nose/drainage. . Use Flonase (fluticasone) nasal spray 1 spray per nostril twice a day for nasal congestion.   Nasal saline spray (i.e., Simply Saline) or nasal saline lavage (i.e., NeilMed) is recommended as needed and prior to medicated nasal sprays.

## 2022-04-21 NOTE — Patient Instructions (Addendum)
Low IgG:  Keep track of infections and antibiotics use.  Get bloodwork We are ordering labs, so please allow 1-2 weeks for the results to come back. With the newly implemented Cures Act, the labs might be visible to you at the same time that they become visible to me. However, I will not address the results until all of the results are back, so please be patient.   Rhinitis:  Use Atrovent (ipratropium) 0.03% 1-2 sprays per nostril twice a day as needed for runny nose/drainage. Use Flonase (fluticasone) nasal spray 1 spray per nostril twice a day for nasal congestion.  Nasal saline spray (i.e., Simply Saline) or nasal saline lavage (i.e., NeilMed) is recommended as needed and prior to medicated nasal sprays.  Coughing: Normal breathing test today.  Follow up with pulmonology and their recommendations. Continue to use the flutter valve daily.  Follow up in 4 months or sooner if needed.

## 2022-04-21 NOTE — Assessment & Plan Note (Signed)
Past history - 2022 spirometry was normal. Interim history - coughing unchanged, has not seen pulm recently.  Today's spirometry was normal.   Follow up with pulmonology and their recommendations.  Continue to use the flutter valve daily.

## 2022-04-22 LAB — CBC WITH DIFFERENTIAL/PLATELET
Basophils Absolute: 0 10*3/uL (ref 0.0–0.2)
Basos: 1 %
EOS (ABSOLUTE): 0 10*3/uL (ref 0.0–0.4)
Eos: 1 %
Hematocrit: 38.7 % (ref 34.0–46.6)
Hemoglobin: 13.3 g/dL (ref 11.1–15.9)
Immature Grans (Abs): 0 10*3/uL (ref 0.0–0.1)
Immature Granulocytes: 0 %
Lymphocytes Absolute: 1.5 10*3/uL (ref 0.7–3.1)
Lymphs: 24 %
MCH: 30.6 pg (ref 26.6–33.0)
MCHC: 34.4 g/dL (ref 31.5–35.7)
MCV: 89 fL (ref 79–97)
Monocytes Absolute: 0.5 10*3/uL (ref 0.1–0.9)
Monocytes: 8 %
Neutrophils Absolute: 4.1 10*3/uL (ref 1.4–7.0)
Neutrophils: 66 %
Platelets: 303 10*3/uL (ref 150–450)
RBC: 4.34 x10E6/uL (ref 3.77–5.28)
RDW: 12.6 % (ref 11.7–15.4)
WBC: 6.2 10*3/uL (ref 3.4–10.8)

## 2022-04-22 LAB — COMPREHENSIVE METABOLIC PANEL
ALT: 22 IU/L (ref 0–32)
AST: 25 IU/L (ref 0–40)
Albumin/Globulin Ratio: 2.1 (ref 1.2–2.2)
Albumin: 4.5 g/dL (ref 3.7–4.7)
Alkaline Phosphatase: 77 IU/L (ref 44–121)
BUN/Creatinine Ratio: 16 (ref 12–28)
BUN: 13 mg/dL (ref 8–27)
Bilirubin Total: 0.4 mg/dL (ref 0.0–1.2)
CO2: 25 mmol/L (ref 20–29)
Calcium: 9.7 mg/dL (ref 8.7–10.3)
Chloride: 106 mmol/L (ref 96–106)
Creatinine, Ser: 0.83 mg/dL (ref 0.57–1.00)
Globulin, Total: 2.1 g/dL (ref 1.5–4.5)
Glucose: 111 mg/dL — ABNORMAL HIGH (ref 70–99)
Potassium: 4.4 mmol/L (ref 3.5–5.2)
Sodium: 145 mmol/L — ABNORMAL HIGH (ref 134–144)
Total Protein: 6.6 g/dL (ref 6.0–8.5)
eGFR: 73 mL/min/{1.73_m2} (ref >59)

## 2022-04-22 LAB — IGG, IGA, IGM
IgA/Immunoglobulin A, Serum: 66 mg/dL (ref 64–422)
IgG (Immunoglobin G), Serum: 672 mg/dL (ref 586–1602)
IgM (Immunoglobulin M), Srm: 31 mg/dL (ref 26–217)

## 2022-04-22 LAB — SEDIMENTATION RATE: Sed Rate: 2 mm/hr (ref 0–40)

## 2022-04-27 ENCOUNTER — Ambulatory Visit: Payer: BC Managed Care – PPO | Admitting: Family Medicine

## 2022-04-27 VITALS — BP 132/70 | HR 93 | Temp 98.5°F | Wt 147.0 lb

## 2022-04-27 DIAGNOSIS — B029 Zoster without complications: Secondary | ICD-10-CM

## 2022-04-27 MED ORDER — VALACYCLOVIR HCL 1 G PO TABS: 1000.0000 mg | ORAL_TABLET | Freq: Three times a day (TID) | ORAL | 0 refills | Status: AC

## 2022-04-27 MED ORDER — METHYLPREDNISOLONE 4 MG PO TBPK: ORAL_TABLET | ORAL | 0 refills | Status: DC

## 2022-04-27 NOTE — Progress Notes (Signed)
   Subjective:    Patient ID: Leslie Reed, female    DOB: December 19, 1945, 76 y.o.   MRN: [id number]  HPI Here for 4 days of a painful rash on the right forehead and temple. Taking Tylenol. No eye pain or vision changes. She has received one shingles vaccine but not the second one. She has never had this before.    Review of Systems  Constitutional: Negative.   HENT: Negative.    Eyes: Negative.   Respiratory: Negative.    Cardiovascular: Negative.   Skin:  Positive for rash.      Objective:   Physical Exam Constitutional:      General: She is not in acute distress.    Appearance: Normal appearance.  Eyes:     Conjunctiva/sclera: Conjunctivae normal.     Pupils: Pupils are equal, round, and reactive to light.  Cardiovascular:     Rate and Rhythm: Normal rate and regular rhythm.     Pulses: Normal pulses.     Heart sounds: Normal heart sounds.  Pulmonary:     Effort: Pulmonary effort is normal.     Breath sounds: Normal breath sounds.  Lymphadenopathy:     Cervical: No cervical adenopathy.  Skin:    Comments: There are clusters of red vesicles on the right parietal scalp, forehead, and temple   Neurological:     Mental Status: She is alert.          Assessment & Plan:  Shingles, treat with 10 days of Valtrex and a Medrol dose pack.  Alysia Penna, MD

## 2022-05-06 ENCOUNTER — Ambulatory Visit: Payer: 59 | Admitting: Adult Health

## 2022-05-07 ENCOUNTER — Telehealth: Payer: Self-pay | Admitting: Allergy

## 2022-05-07 NOTE — Telephone Encounter (Signed)
Patient came into office stating that she has shingles and she wants me to send Dr Maudie Mercury a message to let her know. She states the doctor put her on methylprednisolone and valacycloir 1 gm 3x daily. She stated she does not need a response back, she just wanted to let us know that she has shingles.

## 2022-05-09 NOTE — Telephone Encounter (Signed)
Noted  

## 2022-05-14 DIAGNOSIS — H4323 Crystalline deposits in vitreous body, bilateral: Secondary | ICD-10-CM | POA: Diagnosis not present

## 2022-05-14 DIAGNOSIS — H43813 Vitreous degeneration, bilateral: Secondary | ICD-10-CM | POA: Diagnosis not present

## 2022-05-14 DIAGNOSIS — H31092 Other chorioretinal scars, left eye: Secondary | ICD-10-CM | POA: Diagnosis not present

## 2022-05-14 DIAGNOSIS — B0239 Other herpes zoster eye disease: Secondary | ICD-10-CM | POA: Diagnosis not present

## 2022-06-28 ENCOUNTER — Ambulatory Visit (INDEPENDENT_AMBULATORY_CARE_PROVIDER_SITE_OTHER): Payer: BC Managed Care – PPO | Admitting: Obstetrics & Gynecology

## 2022-06-28 ENCOUNTER — Encounter (HOSPITAL_BASED_OUTPATIENT_CLINIC_OR_DEPARTMENT_OTHER): Payer: Self-pay | Admitting: Obstetrics & Gynecology

## 2022-06-28 VITALS — BP 142/78 | HR 81 | Ht 64.0 in | Wt 150.2 lb

## 2022-06-28 DIAGNOSIS — Z01419 Encounter for gynecological examination (general) (routine) without abnormal findings: Secondary | ICD-10-CM

## 2022-06-28 DIAGNOSIS — Z90710 Acquired absence of both cervix and uterus: Secondary | ICD-10-CM

## 2022-06-28 DIAGNOSIS — E559 Vitamin D deficiency, unspecified: Secondary | ICD-10-CM

## 2022-06-28 DIAGNOSIS — M8580 Other specified disorders of bone density and structure, unspecified site: Secondary | ICD-10-CM | POA: Diagnosis not present

## 2022-06-28 DIAGNOSIS — R030 Elevated blood-pressure reading, without diagnosis of hypertension: Secondary | ICD-10-CM

## 2022-06-28 MED ORDER — VITAMIN D (ERGOCALCIFEROL) 1.25 MG (50000 UNIT) PO CAPS: 50000.0000 [IU] | ORAL_CAPSULE | ORAL | 4 refills | Status: DC

## 2022-06-28 NOTE — Progress Notes (Unsigned)
76 y.o. [id number] Married White or Caucasian female here for annual exam.  Doing well.  Lots of stressors with her son.  Working on possible pancreas transplant.  They go back to [facility] later this week.  Blood pressure is elevated today.  Has cuff at home.  Pt is having a very stressful morning.    Is taking Vit D.  Need RF.  Will draw blood work today.  Would like name for ENT and suggestion for possible cognitive testing for her husband and possibly herself as well.  Patient's last menstrual period was 11/29/1986.          Sexually active: No.  The current method of family planning is status post hysterectomy.    Smoker:  no  Health Maintenance: Pap:  11/11/2008 Negative, hysterectomy History of abnormal Pap:  no MMG:  08/20/2021 Negative Colonoscopy:  12/07/2021.  No follow up recommended.   BMD:   08/14/2020, -2.1 Screening Labs: done 03/2022   reports that she has never smoked. She has been exposed to tobacco smoke. She has never used smokeless tobacco. She reports current alcohol use. She reports that she does not use drugs.  Past Medical History:  Diagnosis Date   Allergic rhinitis    Allergy    Arthritis    hands, lower back   Asthma    Bronchitis    uses inhaler prn   Cataract    just being monitored - no surgery   Colitis 8/04   Diverticulitis    GERD (gastroesophageal reflux disease)    diet controlled, no meds   History of migraine headaches    Hx of colonic polyp    Hx of varicella    Hyperlipidemia    Insomnia    Interstitial cystitis 10/90   Mitral valve prolapse    Osteopenia    Scoliosis 2005   Sleep apnea    Tick bite 2014   STARI-antibiotics x 5 weeks   Tracheobronchopathia-osteochondroplastica     Past Surgical History:  Procedure Laterality Date   ABDOMINAL HYSTERECTOMY     partial  for pain no cancer     ADENOIDECTOMY     APPENDECTOMY     BREAST BIOPSY Right    x 2    BRONCHIAL WASHINGS  04/22/2020   Procedure: BRONCHIAL lavage;  Surgeon:  Julian Hy, DO;  Location: [facility];  Service: Endoscopy;;   CESAREAN SECTION     x4   CHOLECYSTECTOMY     colon polyps     COLONOSCOPY  09/09/2016   Dr.Nandigam   DILATION AND CURETTAGE OF UTERUS     x 2   TONSILLECTOMY     VIDEO BRONCHOSCOPY N/A 04/22/2020   Procedure: VIDEO BRONCHOSCOPY WITHOUT FLUORO;  Surgeon: Julian Hy, DO;  Location: [facility];  Service: Endoscopy;  Laterality: N/A;   WISDOM TOOTH EXTRACTION      Current Outpatient Medications  Medication Sig Dispense Refill   atorvastatin (LIPITOR) 20 MG tablet Take 1 tablet (20 mg total) by mouth every other day. 45 tablet 3   fluticasone (FLONASE) 50 MCG/ACT nasal spray SHAKE LIQUID AND USE 2 SPRAYS IN EACH NOSTRIL DAILY 48 g 0   ipratropium (ATROVENT) 0.03 % nasal spray Place 2 sprays into both nostrils 2 (two) times daily as needed (post nasal drip). 30 mL 5   methylPREDNISolone (MEDROL DOSEPAK) 4 MG TBPK tablet As directed 21 tablet 0   Minoxidil (ROGAINE EX) Apply topically.     Respiratory Therapy Supplies (FLUTTER) DEVI  1 each by Does not apply route 2 (two) times daily. 1 each 0   Vitamin D, Ergocalciferol, (DRISDOL) 1.25 MG (50000 UNIT) CAPS capsule Take 1 capsule (50,000 Units total) by mouth every 14 (fourteen) days. 6 capsule 4   Current Facility-Administered Medications  Medication Dose Route Frequency Provider Last Rate Last Admin   0.9 %  sodium chloride infusion  500 mL Intravenous Continuous Nandigam, Venia Minks, MD        Family History  Problem Relation Age of Onset   Diabetes Mother    Pulmonary fibrosis Mother        97 deceased   Hypertension Mother    Hypertension Father    Alzheimer's disease Father    Kidney failure Father    Diabetes Sister    Hypertension Brother    Diabetes Maternal Grandmother    Colon cancer Maternal Grandfather    Colon polyps Daughter    Thyroid disease Daughter    Psoriasis Daughter    Thyroid disease Daughter    Diabetes Son    Allergy (severe)  Son    Depression Son    Esophageal cancer Neg Hx    Rectal cancer Neg Hx    Stomach cancer Neg Hx     ROS: Constitutional: negative Genitourinary:negative  Exam:   BP (!) 160/82 (BP Location: Right Arm, Patient Position: Sitting, Cuff Size: Normal)   Pulse 81   Ht '5\' 4"'$  (1.626 m) Comment: Reported  Wt 150 lb 3.2 oz (68.1 kg)   LMP 11/29/1986   BMI 25.78 kg/m   Height: '5\' 4"'$  (162.6 cm) (Reported)  General appearance: alert, cooperative and appears stated age Head: Normocephalic, without obvious abnormality, atraumatic Neck: no adenopathy, supple, symmetrical, trachea midline and thyroid normal to inspection and palpation Lungs: clear to auscultation bilaterally Breasts: normal appearance, no masses or tenderness Heart: regular rate and rhythm Abdomen: soft, non-tender; bowel sounds normal; no masses,  no organomegaly Extremities: extremities normal, atraumatic, no cyanosis or edema Skin: Skin color, texture, turgor normal. No rashes or lesions Lymph nodes: Cervical, supraclavicular, and axillary nodes normal. No abnormal inguinal nodes palpated Neurologic: Grossly normal  Pelvic: External genitalia:  no lesions              Urethra:  normal appearing urethra with no masses, tenderness or lesions              Bartholins and Skenes: normal                 Vagina: normal appearing vagina with normal color and no discharge, no lesions              Cervix: absent              Pap taken: No. Bimanual Exam:  Uterus:  uterus absent              Adnexa: no mass, fullness, tenderness               Rectovaginal: Confirms               Anus:  normal sphincter tone, no lesions  Chaperone, Octaviano Batty, CMA, was present for exam.  Assessment/Plan: 1. Well woman exam with routine gynecological exam - Pap smear not indicated.  H/O TAH. - Mammogram 08/20/2021 - Colonoscopy 12/07/2021.  No routine f/u recommended. - Bone mineral density 07/2020 with t score -2.1 - lab work done done  03/2022.  Needs Vit D level checked - vaccines reviewed/updated  2.  Vitamin D deficiency - Vitamin D, Ergocalciferol, (DRISDOL) 1.25 MG (50000 UNIT) CAPS capsule; Take 1 capsule (50,000 Units total) by mouth every 14 (fourteen) days.  Dispense: 6 capsule; Refill: 4 - VITAMIN D 25 Hydroxy (Vit-D Deficiency, Fractures)  3. H/O: hysterectomy  4. Osteopenia, unspecified location - plan to repeat BMD in another year or two  5. Elevated blood pressure reading - pt having a very stressful morning.  Does have BP cuff and will check at home.  She will notify Dr. Regis Bill if stays elevated.

## 2022-06-28 NOTE — Patient Instructions (Addendum)
BP should be <1330/80.  Today is 160/82  Su Raynelle Bring, MD., PA - [place] [address], [place] [address] [phone]

## 2022-06-29 ENCOUNTER — Encounter (HOSPITAL_BASED_OUTPATIENT_CLINIC_OR_DEPARTMENT_OTHER): Payer: Self-pay | Admitting: Obstetrics & Gynecology

## 2022-06-29 LAB — VITAMIN D 25 HYDROXY (VIT D DEFICIENCY, FRACTURES): Vit D, 25-Hydroxy: 39.4 ng/mL (ref 30.0–100.0)

## 2022-06-30 DIAGNOSIS — H43813 Vitreous degeneration, bilateral: Secondary | ICD-10-CM | POA: Diagnosis not present

## 2022-06-30 DIAGNOSIS — H4323 Crystalline deposits in vitreous body, bilateral: Secondary | ICD-10-CM | POA: Diagnosis not present

## 2022-06-30 DIAGNOSIS — B0239 Other herpes zoster eye disease: Secondary | ICD-10-CM | POA: Diagnosis not present

## 2022-06-30 DIAGNOSIS — H31092 Other chorioretinal scars, left eye: Secondary | ICD-10-CM | POA: Diagnosis not present

## 2022-06-30 DIAGNOSIS — H2513 Age-related nuclear cataract, bilateral: Secondary | ICD-10-CM | POA: Diagnosis not present

## 2022-07-21 ENCOUNTER — Ambulatory Visit: Payer: BC Managed Care – PPO | Admitting: Adult Health

## 2022-07-26 ENCOUNTER — Ambulatory Visit: Payer: BC Managed Care – PPO | Admitting: Allergy

## 2022-08-11 NOTE — Progress Notes (Signed)
PATIENT: Leslie Reed DOB: 04/28/1946  REASON FOR VISIT: follow up HISTORY FROM: patient Primary neurologist: Dr. Brett Fairy  Chief Complaint  Patient presents with   Follow-up    Pt in 28  Pt here for CPAP f/u    Pt has questions about the incident report on CPAP machine . Pt states the report higher when she is awake reading the paper      HISTORY OF PRESENT ILLNESS: Today 08/12/22:  Leslie Reed is a 76 year old female with a history of obstructive sleep apnea on CPAP.  She returns today for follow-up.  She states that her sleep has improved slightly since she started using melatonin as needed.  Typically only uses 1 mg.  She does note that in the middle of the night if she wakes up she may read the newspaper before she goes back to sleep.  She does keep her CPAP on but does notice that the events jump up while she is awake.  Her download is below    05/06/21: Leslie Reed is a 76 year old female with a history of obstructive sleep apnea on CPAP.  She returns today for follow-up.  She reports that the CPAP is working well for her.  Reports that she is having some issues with fatigue that her PCP is doing blood work.  She reports that she does have a hard time sleeping for long periods of time.  Tends to wake up every 2 hours.  She returns today for an evaluation.   04/30/20 Leslie Reed is a 76 year old female with a history of obstructive sleep apnea on CPAP.  Her download indicates that she use her machine 27 out of 30 days for compliance of 90%.  She is averaging greater than 4 hours each night.  On average she uses her machine 4 hours and 58 minutes.  Her residual AHI is 2.3 on 5 to 15 cm of water with EPR 3.  Leak in the 95th percentile is 26.8 L/min.  Reports that the CPAP works fairly well for her.  She does note that she is not changed her supplies out since she received them.  She does have a so clean machine but has never used it.  HISTORY (Copied from Dr.Dohmeier's note) Interval  history from 07 December 2018 I had the pleasure of seeing Leslie Reed today who had since I have seen her last undergone a home sleep test by watch Fraser Din on 07 December 2017 exactly a year ago home sleep test was based on an elevated fatigue severity score at 38 points and resulted in an AHI of 7.4, RDI was 8.6, there was no prolonged oxygenation time, heart rate was 59 bpm with a tendency to be bradycardic.  This was followed by a an in lab test was ordered for his REM behavior disorder and was refused by her insurance.  She started on an auto titration CPAP between 5 and 13 cmH2O following the home sleep test results there is 3 cm expiratory pressure relief set for her as well she has achieved and 97% compliance and has been compliant in the last 2 visits with Cecille Rubin, NP.  Average use of time is 5 hours 3 minutes result residual AHI is 2.3 which is excellent the residual apneas consist mostly of obstructive apneas telling me that she could handle a little bit higher pressure well.  Epworth sleepiness score was reduced to 8, fatigue severity was still high at 42 and the geriatric depression score today was  4 out of 15 points.  We also addressed her mild cognitive impairment history which I think is partially due to overwhelming concern in regards to her son's failing health.  REVIEW OF SYSTEMS: Out of a complete 14 system review of symptoms, the patient complains only of the following symptoms, and all other reviewed systems are negative.  ESS 5  ALLERGIES: No Known Allergies  HOME MEDICATIONS: Outpatient Medications Prior to Visit  Medication Sig Dispense Refill   atorvastatin (LIPITOR) 20 MG tablet Take 1 tablet (20 mg total) by mouth every other day. 45 tablet 3   fluticasone (FLONASE) 50 MCG/ACT nasal spray SHAKE LIQUID AND USE 2 SPRAYS IN EACH NOSTRIL DAILY 48 g 0   ipratropium (ATROVENT) 0.03 % nasal spray Place 2 sprays into both nostrils 2 (two) times daily as needed (post nasal drip). 30  mL 5   methylPREDNISolone (MEDROL DOSEPAK) 4 MG TBPK tablet As directed 21 tablet 0   Minoxidil (ROGAINE EX) Apply topically.     Respiratory Therapy Supplies (FLUTTER) DEVI 1 each by Does not apply route 2 (two) times daily. 1 each 0   Vitamin D, Ergocalciferol, (DRISDOL) 1.25 MG (50000 UNIT) CAPS capsule Take 1 capsule (50,000 Units total) by mouth every 14 (fourteen) days. 6 capsule 4   Facility-Administered Medications Prior to Visit  Medication Dose Route Frequency Provider Last Rate Last Admin   0.9 %  sodium chloride infusion  500 mL Intravenous Continuous Nandigam, Venia Minks, MD        PAST MEDICAL HISTORY: Past Medical History:  Diagnosis Date   Allergic rhinitis    Allergy    Arthritis    hands, lower back   Asthma    Bronchitis    uses inhaler prn   Cataract    just being monitored - no surgery   Colitis 8/04   Diverticulitis    GERD (gastroesophageal reflux disease)    diet controlled, no meds   History of migraine headaches    Hx of colonic polyp    Hx of varicella    Hyperlipidemia    Insomnia    Interstitial cystitis 10/90   Mitral valve prolapse    Osteopenia    Scoliosis 2005   Sleep apnea    Tick bite 2014   STARI-antibiotics x 5 weeks   Tracheobronchopathia-osteochondroplastica     PAST SURGICAL HISTORY: Past Surgical History:  Procedure Laterality Date   ABDOMINAL HYSTERECTOMY     partial  for pain no cancer     ADENOIDECTOMY     APPENDECTOMY     BREAST BIOPSY Right    x 2    BRONCHIAL WASHINGS  04/22/2020   Procedure: BRONCHIAL lavage;  Surgeon: Julian Hy, DO;  Location: [facility];  Service: Endoscopy;;   CESAREAN SECTION     x4   CHOLECYSTECTOMY     colon polyps     COLONOSCOPY  09/09/2016   Dr.Nandigam   DILATION AND CURETTAGE OF UTERUS     x 2   TONSILLECTOMY     VIDEO BRONCHOSCOPY N/A 04/22/2020   Procedure: VIDEO BRONCHOSCOPY WITHOUT FLUORO;  Surgeon: Julian Hy, DO;  Location: [facility];  Service: Endoscopy;   Laterality: N/A;   WISDOM TOOTH EXTRACTION      FAMILY HISTORY: Family History  Problem Relation Age of Onset   Diabetes Mother    Pulmonary fibrosis Mother        25 deceased   Hypertension Mother    Hypertension Father  Alzheimer's disease Father    Kidney failure Father    Diabetes Sister    Hypertension Brother    Sleep apnea Brother    Diabetes Maternal Grandmother    Colon cancer Maternal Grandfather    Colon polyps Daughter    Thyroid disease Daughter    Psoriasis Daughter    Thyroid disease Daughter    Diabetes Son    Allergy (severe) Son    Depression Son    Esophageal cancer Neg Hx    Rectal cancer Neg Hx    Stomach cancer Neg Hx     SOCIAL HISTORY: Social History   Socioeconomic History   Marital status: Married    Spouse name: Not on file   Number of children: Not on file   Years of education: Not on file   Highest education level: Master's degree (e.g., MA, MS, MEng, MEd, MSW, MBA)  Occupational History   Not on file  Tobacco Use   Smoking status: Never    Passive exposure: Yes   Smokeless tobacco: Never  Vaping Use   Vaping Use: Never used  Substance and Sexual Activity   Alcohol use: Yes    Comment: occ   Drug use: No   Sexual activity: Not Currently    Partners: Male    Birth control/protection: Surgical, Post-menopausal    Comment: TAH  Other Topics Concern   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   hhof 2   G6 p4    Pet cats    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    [address] 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   Neg td    Sleep 4-6 interrupted    Family is WISC father with alzheimers   Social Determinants of Health   Financial Resource Strain: Low Risk  (04/27/2022)   Overall Financial Resource Strain (CARDIA)    Difficulty of Paying Living Expenses: Not hard at all  Food Insecurity: No Food Insecurity (04/27/2022)   Hunger Vital Sign    Worried About Running Out of Food in the Last Year: Never  true    Junior in the Last Year: Never true  Transportation Needs: No Transportation Needs (04/27/2022)   PRAPARE - Hydrologist (Medical): No    Lack of Transportation (Non-Medical): No  Physical Activity: Insufficiently Active (04/27/2022)   Exercise Vital Sign    Days of Exercise per Week: 1 day    Minutes of Exercise per Session: 30 min  Stress: Stress Concern Present (04/27/2022)   [place]    Feeling of Stress : To some extent  Social Connections: Moderately Isolated (04/27/2022)   Social Connection and Isolation Panel [NHANES]    Frequency of Communication with Friends and Family: More than three times a week    Frequency of Social Gatherings with Friends and Family: More than three times a week    Attends Religious Services: Never    Marine scientist or Organizations: No    Attends Music therapist: Not on file    Marital Status: Married  Human resources officer Violence: Not on file      PHYSICAL EXAM  Vitals:   08/12/22 0756  BP: 127/73  Pulse: 69  Weight: 155 lb (70.3 kg)  Height: '5\' 4"'$  (1.626 m)   Body mass index is 26.61 kg/m.  Generalized: Well developed, in no acute distress  Chest: Lungs clear  to auscultation bilaterally  Neurological examination  Mentation: Alert oriented to time, place, history taking. Follows all commands speech and language fluent Cranial nerve II-XII: Extraocular movements were full, visual field were full on confrontational test Head turning and shoulder shrug  were normal and symmetric. Motor: The motor testing reveals 5 over 5 strength of all 4 extremities. Good symmetric motor tone is noted throughout.  Sensory: Sensory testing is intact to soft touch on all 4 extremities. No evidence of extinction is noted.  Gait and station: Gait is normal.    DIAGNOSTIC DATA (LABS, IMAGING, TESTING) - I reviewed patient records,  labs, notes, testing and imaging myself where available.  Lab Results  Component Value Date   WBC 6.2 04/21/2022   HGB 13.3 04/21/2022   HCT 38.7 04/21/2022   MCV 89 04/21/2022   PLT 303 04/21/2022      Component Value Date/Time   NA 145 (H) 04/21/2022 1543   K 4.4 04/21/2022 1543   CL 106 04/21/2022 1543   CO2 25 04/21/2022 1543   GLUCOSE 111 (H) 04/21/2022 1543   GLUCOSE 84 02/10/2021 1029   BUN 13 04/21/2022 1543   CREATININE 0.83 04/21/2022 1543   CREATININE 0.66 07/01/2015 1415   CALCIUM 9.7 04/21/2022 1543   PROT 6.6 04/21/2022 1543   ALBUMIN 4.5 04/21/2022 1543   AST 25 04/21/2022 1543   ALT 22 04/21/2022 1543   ALKPHOS 77 04/21/2022 1543   BILITOT 0.4 04/21/2022 1543   GFRNONAA 89.85 02/24/2010 1118   Lab Results  Component Value Date   CHOL 143 02/10/2022   HDL 52.50 02/10/2022   LDLCALC 76 02/10/2022   LDLDIRECT 138.0 10/13/2011   TRIG 73.0 02/10/2022   CHOLHDL 3 02/10/2022   Lab Results  Component Value Date   HGBA1C 5.5 02/10/2021   Lab Results  Component Value Date   VITAMINB12 384 10/08/2016   Lab Results  Component Value Date   TSH 0.96 12/18/2019      ASSESSMENT AND PLAN 76 y.o. year old female  has a past medical history of Allergic rhinitis, Allergy, Arthritis, Asthma, Bronchitis, Cataract, Colitis (8/04), Diverticulitis, GERD (gastroesophageal reflux disease), History of migraine headaches, colonic polyp, varicella, Hyperlipidemia, Insomnia, Interstitial cystitis (10/90), Mitral valve prolapse, Osteopenia, Scoliosis (2005), Sleep apnea, Tick bite (2014), and Tracheobronchopathia-osteochondroplastica. here with:  OSA on CPAP  - CPAP compliance excellent - Good treatment of AHI  - Encourage patient to use CPAP nightly and > 4 hours each night - F/U in 1 year or sooner if needed    Ward Givens, MSN, NP-C 08/12/2022, 8:12 AM [facility] [address] [place], [place] [address] [phone]

## 2022-08-12 ENCOUNTER — Ambulatory Visit: Payer: BC Managed Care – PPO | Admitting: Adult Health

## 2022-08-12 ENCOUNTER — Encounter: Payer: Self-pay | Admitting: Adult Health

## 2022-08-12 VITALS — BP 127/73 | HR 69 | Ht 64.0 in | Wt 155.0 lb

## 2022-08-12 DIAGNOSIS — G4733 Obstructive sleep apnea (adult) (pediatric): Secondary | ICD-10-CM

## 2022-08-12 DIAGNOSIS — Z9989 Dependence on other enabling machines and devices: Secondary | ICD-10-CM

## 2022-08-12 NOTE — Patient Instructions (Signed)
Your Plan: ? ?Continue ? ? ? ? ?Thank you for coming to see us at [facility]. I hope we have been able to provide you high quality care today. ? ?You may receive a patient satisfaction survey over the next few weeks. We would appreciate your feedback and comments so that we may continue to improve ourselves and the health of our patients. ? ?

## 2022-08-27 DIAGNOSIS — Z1231 Encounter for screening mammogram for malignant neoplasm of breast: Secondary | ICD-10-CM | POA: Diagnosis not present

## 2022-08-27 LAB — HM MAMMOGRAPHY

## 2022-08-31 ENCOUNTER — Encounter (HOSPITAL_BASED_OUTPATIENT_CLINIC_OR_DEPARTMENT_OTHER): Payer: Self-pay | Admitting: *Deleted

## 2022-09-05 NOTE — Progress Notes (Unsigned)
Follow Up Note  RE: Leslie Reed MRN: [id number] DOB: [birth year] Date of Office Visit: 09/06/2022  Referring provider: Burnis Medin, MD Primary care provider: Burnis Medin, MD  Chief Complaint: Other (Here to stop infusions and blood work )  History of Present Illness: I had the pleasure of seeing Leslie Reed for a follow up visit at the [facility] on 09/06/2022. She is a 76 y.o. female, who is being followed for hypogam, bronchiectasis, nonallergic rhinitis. Her previous allergy office visit was on 04/21/2022 with Dr. Maudie Mercury. Today is a regular follow up visit.  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 About 2 weeks ago had a viral URI that resolved fairly quickly No antibiotics since the last visit.  Up to date with flu and Covid-19 booster. Will get RSV and second Shingles vaccine.  Not sure if patient is up to date with pneumonia vaccine.  Taking care of son who has some medical issues.   Bronchiectasis  Coughing is unchanged and has mucous mainly in the mornings. Not using flutter valve everyday.  No recent pulmonology visit.   Noticed dyspnea on exertion which resolved within a few minutes.    Nonallergic rhinitis Uses Flonase more often than the Atrovent.   Assessment and Plan: Leslie Reed is a 76 y.o. female with: Hypogammaglobulinemia [facility]) Past history - Patient referred by pulmonology for low IgG and IgM. History of frequent respiratory infections, mild central bronchiectasis, mother who passed away from IPF. Up to date with COVID-19 vaccine, flu, pneumovax and prevnar.  2021 bloodwork diptheria, tetanus and s. Pneumonia titers adequate. Alpha 1 level and total complement level normal. No history of meningitis or blood clots. Arthralgias and headaches improved with Cuvitru 20g every 4 weeks. Last Cuvitru infusion in February 2023. Interim history - No antibiotics since last visit.  Had Shingles and viral URI. Keep  track of infections and antibiotics use.  Get bloodwork.  Bronchiectasis without complication ([place]) Past history - 2022 spirometry was normal. Interim history - coughing unchanged and mainly in the mornings.  Follow up with pulmonology and their recommendations. Continue to use the flutter valve daily. Consider vest therapy.  Get spirometry at next visit.  Nonallergic rhinitis Past history - Perennial rhinitis symptoms and using Vick's vapor rub. 2021 skin testing was negative to environmental allergies.  Interim history - some PND. Use Atrovent (ipratropium) 0.03% 1-2 sprays per nostril twice a day as needed for runny nose/drainage. Use at night.  Use Flonase (fluticasone) nasal spray 1 spray per nostril twice a day for nasal congestion.  Nasal saline spray (i.e., Simply Saline) or nasal saline lavage (i.e., NeilMed) is recommended as needed and prior to medicated nasal sprays.  Return in about 4 months (around 01/07/2023).  Meds ordered this encounter  Medications   ipratropium (ATROVENT) 0.03 % nasal spray    Sig: Place 1-2 sprays into both nostrils 2 (two) times daily as needed (nasal drainage).    Dispense:  30 mL    Refill:  5   Lab Orders         IgG, IgA, IgM         Strep pneumoniae 23 Serotypes IgG         CBC with Differential/Platelet      Diagnostics: None.   Medication List:  Current Outpatient Medications  Medication Sig Dispense Refill   atorvastatin (LIPITOR) 20 MG tablet Take 1 tablet (20 mg total) by mouth every other day. 45 tablet 3  fluticasone (FLONASE) 50 MCG/ACT nasal spray SHAKE LIQUID AND USE 2 SPRAYS IN EACH NOSTRIL DAILY 48 g 0   ipratropium (ATROVENT) 0.03 % nasal spray Place 1-2 sprays into both nostrils 2 (two) times daily as needed (nasal drainage). 30 mL 5   Minoxidil (ROGAINE EX) Apply topically.     Respiratory Therapy Supplies (FLUTTER) DEVI 1 each by Does not apply route 2 (two) times daily. 1 each 0   Vitamin D, Ergocalciferol,  (DRISDOL) 1.25 MG (50000 UNIT) CAPS capsule Take 1 capsule (50,000 Units total) by mouth every 14 (fourteen) days. 6 capsule 4   Current Facility-Administered Medications  Medication Dose Route Frequency Provider Last Rate Last Admin   0.9 %  sodium chloride infusion  500 mL Intravenous Continuous Nandigam, Kavitha V, MD       Allergies: No Known Allergies I reviewed her past medical history, social history, family history, and environmental history and no significant changes have been reported from her previous visit.  Review of Systems  Constitutional:  Negative for appetite change, chills, fever and unexpected weight change.  HENT:  Positive for postnasal drip. Negative for congestion, ear pain and rhinorrhea.   Eyes:  Negative for itching.  Respiratory:  Positive for cough. Negative for chest tightness, shortness of breath and wheezing.   Cardiovascular:  Negative for chest pain.  Gastrointestinal:  Negative for abdominal pain.  Genitourinary:  Negative for difficulty urinating.  Allergic/Immunologic: Negative for environmental allergies and food allergies.    Objective: BP 122/76   Pulse 66   Temp 98.7 F (37.1 C)   Resp 18   Ht '5\' 4"'$  (1.626 m)   Wt 150 lb 6.4 oz (68.2 kg)   LMP 11/29/1986   SpO2 95%   BMI 25.82 kg/m  Body mass index is 25.82 kg/m. Physical Exam Vitals and nursing note reviewed.  Constitutional:      Appearance: Normal appearance. She is well-developed.  HENT:     Head: Normocephalic and atraumatic.     Right Ear: Tympanic membrane and external ear normal.     Left Ear: Tympanic membrane and external ear normal.     Nose: Nose normal. No congestion or rhinorrhea.     Mouth/Throat:     Mouth: Mucous membranes are moist.     Pharynx: Oropharynx is clear.  Eyes:     Conjunctiva/sclera: Conjunctivae normal.  Cardiovascular:     Rate and Rhythm: Normal rate and regular rhythm.     Heart sounds: Normal heart sounds. No murmur heard.    No friction  rub. No gallop.  Pulmonary:     Effort: Pulmonary effort is normal.     Breath sounds: Normal breath sounds. No wheezing, rhonchi or rales.  Musculoskeletal:     Cervical back: Neck supple.  Skin:    General: Skin is warm.  Neurological:     Mental Status: She is alert and oriented to person, place, and time.  Psychiatric:        Mood and Affect: Mood normal.        Behavior: Behavior normal.    Previous notes and tests were reviewed. The plan was reviewed with the patient/family, and all questions/concerned were addressed.  It was my pleasure to see Nyima today and participate in her care. Please feel free to contact me with any questions or concerns.  Sincerely,  Rexene Alberts, DO Allergy & Immunology  [facility] office: [place] office: [phone]

## 2022-09-06 ENCOUNTER — Ambulatory Visit: Payer: BC Managed Care – PPO | Admitting: Allergy

## 2022-09-06 ENCOUNTER — Encounter: Payer: Self-pay | Admitting: Allergy

## 2022-09-06 VITALS — BP 122/76 | HR 66 | Temp 98.7°F | Resp 18 | Ht 64.0 in | Wt 150.4 lb

## 2022-09-06 DIAGNOSIS — J479 Bronchiectasis, uncomplicated: Secondary | ICD-10-CM

## 2022-09-06 DIAGNOSIS — D801 Nonfamilial hypogammaglobulinemia: Secondary | ICD-10-CM | POA: Diagnosis not present

## 2022-09-06 DIAGNOSIS — J310 Chronic rhinitis: Secondary | ICD-10-CM

## 2022-09-06 MED ORDER — IPRATROPIUM BROMIDE 0.03 % NA SOLN: 1.0000 | Freq: Two times a day (BID) | NASAL | 5 refills | Status: DC | PRN

## 2022-09-06 NOTE — Assessment & Plan Note (Signed)
Past history - 2022 spirometry was normal. Interim history - coughing unchanged and mainly in the mornings.   Follow up with pulmonology and their recommendations.  Continue to use the flutter valve daily.  Consider vest therapy.   Get spirometry at next visit.

## 2022-09-06 NOTE — Assessment & Plan Note (Signed)
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 History of frequent respiratory infections, mild central bronchiectasis, mother who passed away from IPF. Up to date with COVID-19 vaccine, flu, pneumovax and prevnar.  2021 bloodwork diptheria, tetanus and s. Pneumonia titers adequate. Alpha 1 level and total complement level normal. No history of meningitis or blood clots. Arthralgias and headaches improved with Cuvitru 20g every 4 weeks. Last Cuvitru infusion in February 2023. Interim history - No antibiotics since last visit.  Had Shingles and viral URI.  Keep track of infections and antibiotics use.  . Get bloodwork.

## 2022-09-06 NOTE — Patient Instructions (Addendum)
Low IgG:  Keep track of infections and antibiotics use.  Get bloodwork We are ordering labs, so please allow 1-2 weeks for the results to come back. With the newly implemented Cures Act, the labs might be visible to you at the same time that they become visible to me. However, I will not address the results until all of the results are back, so please be patient.   Rhinitis:  Use Atrovent (ipratropium) 0.03% 1-2 sprays per nostril twice a day as needed for runny nose/drainage. Use at night.  Use Flonase (fluticasone) nasal spray 1 spray per nostril twice a day for nasal congestion.  Nasal saline spray (i.e., Simply Saline) or nasal saline lavage (i.e., NeilMed) is recommended as needed and prior to medicated nasal sprays.  Coughing: Follow up with pulmonology and their recommendations. Continue to use the flutter valve daily. Consider vest therapy.   Follow up in 4 months or sooner if needed.

## 2022-09-06 NOTE — Assessment & Plan Note (Signed)
Past history - Perennial rhinitis symptoms and using Vick's vapor rub. 2021 skin testing was negative to environmental allergies.  Interim history - some PND. Marland Kitchen Use Atrovent (ipratropium) 0.03% 1-2 sprays per nostril twice a day as needed for runny nose/drainage. Use at night.  . Use Flonase (fluticasone) nasal spray 1 spray per nostril twice a day for nasal congestion.   Nasal saline spray (i.e., Simply Saline) or nasal saline lavage (i.e., NeilMed) is recommended as needed and prior to medicated nasal sprays.

## 2022-09-10 LAB — STREP PNEUMONIAE 23 SEROTYPES IGG
Pneumo Ab Type 1*: 4.6 ug/mL (ref >1.3)
Pneumo Ab Type 12 (12F)*: <0.1 ug/mL — ABNORMAL LOW (ref >1.3)
Pneumo Ab Type 14*: >18.7 ug/mL (ref >1.3)
Pneumo Ab Type 17 (17F)*: 0.5 ug/mL — ABNORMAL LOW (ref >1.3)
Pneumo Ab Type 19 (19F)*: 20.7 ug/mL (ref >1.3)
Pneumo Ab Type 2*: 4.4 ug/mL (ref >1.3)
Pneumo Ab Type 20*: 3.2 ug/mL (ref >1.3)
Pneumo Ab Type 22 (22F)*: 1.3 ug/mL — ABNORMAL LOW (ref >1.3)
Pneumo Ab Type 23 (23F)*: 2.2 ug/mL (ref >1.3)
Pneumo Ab Type 26 (6B)*: 10.3 ug/mL (ref >1.3)
Pneumo Ab Type 3*: 0.9 ug/mL — ABNORMAL LOW (ref >1.3)
Pneumo Ab Type 34 (10A)*: 0.7 ug/mL — ABNORMAL LOW (ref >1.3)
Pneumo Ab Type 4*: 0.3 ug/mL — ABNORMAL LOW (ref >1.3)
Pneumo Ab Type 43 (11A)*: 1.1 ug/mL — ABNORMAL LOW (ref >1.3)
Pneumo Ab Type 5*: 6.5 ug/mL (ref >1.3)
Pneumo Ab Type 51 (7F)*: 1.9 ug/mL (ref >1.3)
Pneumo Ab Type 54 (15B)*: 2.5 ug/mL (ref >1.3)
Pneumo Ab Type 56 (18C)*: 2 ug/mL (ref >1.3)
Pneumo Ab Type 57 (19A)*: 3.6 ug/mL (ref >1.3)
Pneumo Ab Type 68 (9V)*: 1.2 ug/mL — ABNORMAL LOW (ref >1.3)
Pneumo Ab Type 70 (33F)*: 2.6 ug/mL (ref >1.3)
Pneumo Ab Type 8*: 0.8 ug/mL — ABNORMAL LOW (ref >1.3)
Pneumo Ab Type 9 (9N)*: 1.2 ug/mL — ABNORMAL LOW (ref >1.3)

## 2022-09-10 LAB — CBC WITH DIFFERENTIAL/PLATELET
Basophils Absolute: 0.1 10*3/uL (ref 0.0–0.2)
Basos: 1 %
EOS (ABSOLUTE): 0 10*3/uL (ref 0.0–0.4)
Eos: 1 %
Hematocrit: 41.7 % (ref 34.0–46.6)
Hemoglobin: 14.2 g/dL (ref 11.1–15.9)
Immature Grans (Abs): 0 10*3/uL (ref 0.0–0.1)
Immature Granulocytes: 0 %
Lymphocytes Absolute: 1.3 10*3/uL (ref 0.7–3.1)
Lymphs: 26 %
MCH: 31.1 pg (ref 26.6–33.0)
MCHC: 34.1 g/dL (ref 31.5–35.7)
MCV: 91 fL (ref 79–97)
Monocytes Absolute: 0.5 10*3/uL (ref 0.1–0.9)
Monocytes: 10 %
Neutrophils Absolute: 3 10*3/uL (ref 1.4–7.0)
Neutrophils: 62 %
Platelets: 297 10*3/uL (ref 150–450)
RBC: 4.57 x10E6/uL (ref 3.77–5.28)
RDW: 12.6 % (ref 11.7–15.4)
WBC: 4.9 10*3/uL (ref 3.4–10.8)

## 2022-09-10 LAB — IGG, IGA, IGM
IgA/Immunoglobulin A, Serum: 67 mg/dL (ref 64–422)
IgG (Immunoglobin G), Serum: 581 mg/dL — ABNORMAL LOW (ref 586–1602)
IgM (Immunoglobulin M), Srm: 29 mg/dL (ref 26–217)

## 2022-09-29 ENCOUNTER — Other Ambulatory Visit (HOSPITAL_COMMUNITY): Payer: Self-pay | Admitting: Cardiology

## 2022-09-29 DIAGNOSIS — E7800 Pure hypercholesterolemia, unspecified: Secondary | ICD-10-CM

## 2022-09-30 ENCOUNTER — Ambulatory Visit (HOSPITAL_BASED_OUTPATIENT_CLINIC_OR_DEPARTMENT_OTHER)
Admission: RE | Admit: 2022-09-30 | Discharge: 2022-09-30 | Disposition: A | Discharge status: Home / Self Care | Payer: BC Managed Care – PPO | Source: Ambulatory Visit | Attending: Cardiology | Admitting: Cardiology

## 2022-09-30 DIAGNOSIS — E7800 Pure hypercholesterolemia, unspecified: Secondary | ICD-10-CM | POA: Insufficient documentation

## 2022-10-03 NOTE — Progress Notes (Signed)
I am fine with stopping the statin medication  based on recent risk assessment .  Marland Kitchen If she wants to discuss  make virtual visit . Please correct med list if she chooses to stop the statin

## 2022-10-06 ENCOUNTER — Ambulatory Visit: Payer: BC Managed Care – PPO | Admitting: Allergy

## 2022-10-07 ENCOUNTER — Encounter: Payer: Self-pay | Admitting: Internal Medicine

## 2022-10-07 ENCOUNTER — Other Ambulatory Visit: Payer: Self-pay

## 2022-10-07 NOTE — Progress Notes (Signed)
[facility] 

## 2022-10-14 DIAGNOSIS — D225 Melanocytic nevi of trunk: Secondary | ICD-10-CM | POA: Diagnosis not present

## 2022-10-14 DIAGNOSIS — L309 Dermatitis, unspecified: Secondary | ICD-10-CM | POA: Diagnosis not present

## 2022-10-14 DIAGNOSIS — L578 Other skin changes due to chronic exposure to nonionizing radiation: Secondary | ICD-10-CM | POA: Diagnosis not present

## 2022-10-14 DIAGNOSIS — L719 Rosacea, unspecified: Secondary | ICD-10-CM | POA: Diagnosis not present

## 2022-10-31 ENCOUNTER — Telehealth: Payer: BC Managed Care – PPO | Admitting: Physician Assistant

## 2022-10-31 DIAGNOSIS — U071 COVID-19: Secondary | ICD-10-CM

## 2022-10-31 MED ORDER — NIRMATRELVIR/RITONAVIR (PAXLOVID)TABLET: 3.0000 | ORAL_TABLET | Freq: Two times a day (BID) | ORAL | 0 refills | Status: AC

## 2022-10-31 NOTE — Patient Instructions (Signed)
COVID or suspected COVID home recommendations  For current/suspected COVID symptoms: - Please watch closely for new onset shortness of breath, worsening shortness of breath, dizziness, confusion or any worsening symptoms. If any of these occur, please contact us during business hours, and if after business hours, please seek urgent care or go to the closest emergency room.  -Consider purchasing a pulse oximeter. If your levels are 94% or below persistently, please seek care at the hospital.   -If you test positive for COVID, everyone, regardless of vaccination status, should stay home for 5 days since symptom onset (or if asymptomatic, on day of positive test.) If you have no symptoms or your symptoms are resolving after 5 days, you can leave your house. Continue to wear a mask around others for 5 additional days. If you have a fever, continue to stay home until your fever resolves without use of medication.  -Please inform any contacts of your positive result so they can appropriately quarantine/test.  -Push fluids and try to eat small, frequent meals with protein to maintain your stamina.  

## 2022-10-31 NOTE — Progress Notes (Signed)
Virtual Visit Consent   Leslie Reed, you are scheduled for a virtual visit with a [facility] provider today. Just as with appointments in the office, your consent must be obtained to participate. Your consent will be active for this visit and any virtual visit you may have with one of our providers in the next 365 days. If you have a MyChart account, a copy of this consent can be sent to you electronically.  As this is a virtual visit, video technology does not allow for your provider to perform a traditional examination. This may limit your provider's ability to fully assess your condition. If your provider identifies any concerns that need to be evaluated in person or the need to arrange testing (such as labs, EKG, etc.), we will make arrangements to do so. Although advances in technology are sophisticated, we cannot ensure that it will always work on either your end or our end. If the connection with a video visit is poor, the visit may have to be switched to a telephone visit. With either a video or telephone visit, we are not always able to ensure that we have a secure connection.  By engaging in this virtual visit, you consent to the provision of healthcare and authorize for your insurance to be billed (if applicable) for the services provided during this visit. Depending on your insurance coverage, you may receive a charge related to this service.  I need to obtain your verbal consent now. Are you willing to proceed with your visit today? Leslie Reed has provided verbal consent on 10/31/2022 for a virtual visit (video or telephone). Inda Coke, [place]  Date: 10/31/2022 11:24 AM  Virtual Visit via Video Note   I, Inda Coke, connected with  Leslie Reed  ([id number], 28-Sep-1946) on 10/31/22 at 11:15 AM EST by a video-enabled telemedicine application and verified that I am speaking with the correct person using two identifiers.  Location: Patient: Virtual Visit Location Patient:  Home Provider: Virtual Visit Location Provider: Home Office   I discussed the limitations of evaluation and management by telemedicine and the availability of in person appointments. The patient expressed understanding and agreed to proceed.    History of Present Illness: Leslie Reed is a 76 y.o. who identifies as a female who was assigned female at birth, and is being seen today for [place].  Patient tested positive for COVID this morning. Sx started Friday. She denies chest pain, SOB, weakness, malaise.  She has had at least 5 COVID vaccines. She has remote hx of asthma and does not consider herself an asthmatic. Does not use inhalers regularly. She has not taken anything yet for her symptoms. This is her first known COVID illness.  HPI: HPI  Problems:  Patient Active Problem List   Diagnosis Date Noted   Generalized headaches 08/12/2021   Knee pain, bilateral 10/28/2020   Hypogammaglobulinemia ([place]) 10/22/2020   Nonallergic rhinitis 05/19/2020   Low serum IgG for age 32/21/2021   Bronchiectasis without complication ([place]) 12/07/3233   Hyperlipidemia 12/15/2018   Arthralgia 12/15/2018   Obstructive sleep apnea treated with continuous positive airway pressure (CPAP) 03/15/2018   Adjustment insomnia 11/17/2017   Swelling of both ankles 05/03/2013   LFTs abnormal 01/02/2012   Right-sided chest wall pain 01/02/2012   Visit for preventive health examination 12/28/2011   Sleep disturbance 10/13/2011   Memory difficulty 10/13/2011   Migraine headache 10/13/2011   Low back pain 06/21/2011   ASTHMA, INTERMITTENT 09/01/2010   RECTAL  BLEEDING 02/24/2010   PANCOLITIS 03/12/2009   COLONIC POLYPS, ADENOMATOUS, HX OF 03/12/2009   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 (Take nirmatrelvir 150 mg two tablets twice daily for 5 days and ritonavir 100 mg one tablet twice daily for 5 days), Disp: 30 tablet, Rfl: 0   fluticasone (FLONASE) 50 MCG/ACT nasal spray, SHAKE LIQUID AND USE 2 SPRAYS IN EACH NOSTRIL DAILY, Disp: 48 g, Rfl: 0   ipratropium (ATROVENT) 0.03 % nasal spray, Place 1-2 sprays into both nostrils 2 (two) times daily as needed (nasal drainage)., Disp: 30 mL, Rfl: 5   Minoxidil (ROGAINE EX), Apply topically., Disp: , Rfl:    Respiratory Therapy Supplies (FLUTTER) DEVI, 1 each by Does not apply route 2 (two) times daily., Disp: 1 each, Rfl: 0   Vitamin D, Ergocalciferol, (DRISDOL) 1.25 MG (50000 UNIT) CAPS capsule, Take 1 capsule (50,000 Units total) by mouth every 14 (fourteen) days., Disp: 6 capsule, Rfl: 4  Current Facility-Administered Medications:    0.9 %  sodium chloride infusion, 500 mL, Intravenous, Continuous, Nandigam, Kavitha V, MD  Observations/Objective: Patient is well-developed, well-nourished in no acute distress.  Resting comfortably  at home.  Head is normocephalic, atraumatic.  No labored breathing.  Speech is clear and coherent with logical content.  Patient is alert and oriented at baseline.   Assessment and Plan: 1. COVID-19 No red flags on exam.  Will initiate paxlovid per orders. Discussed taking medications as prescribed. Reviewed return precautions including worsening fever, SOB, worsening cough or other concerns. Push fluids and rest. I recommend that patient follow-up if symptoms worsen or persist despite treatment x 7-10 days, sooner if needed.  Follow Up Instructions: I discussed the assessment and treatment plan with the patient. The patient was provided an opportunity to ask questions and all were answered. The patient agreed  with the plan and demonstrated an understanding of the instructions.  A copy of instructions were sent to the patient via MyChart unless otherwise noted below.   The patient was advised to call back or seek an in-person evaluation if the symptoms worsen or if the condition fails to improve as anticipated.  Time:  I spent 5-10 minutes with the patient via telehealth technology discussing the above problems/concerns.    Inda Coke, [place]

## 2022-11-09 ENCOUNTER — Telehealth: Payer: Self-pay | Admitting: Adult Health

## 2022-11-09 DIAGNOSIS — G4733 Obstructive sleep apnea (adult) (pediatric): Secondary | ICD-10-CM

## 2022-11-09 NOTE — Telephone Encounter (Signed)
Pt is calling. Stated she needs a prescription for her CPAP supplies sent to [facility] - [phone]. Pt is asking that you send insurance information also.

## 2022-11-09 NOTE — Telephone Encounter (Signed)
PAP supply order signed on behalf of Dr. Freddy Finner M.

## 2022-11-09 NOTE — Telephone Encounter (Signed)
Sending orders to Dr Rexene Alberts to be signed

## 2022-11-09 NOTE — Telephone Encounter (Signed)
Orders faxed over this afternoon to DME

## 2022-11-11 ENCOUNTER — Encounter: Payer: Self-pay | Admitting: Internal Medicine

## 2022-11-11 ENCOUNTER — Ambulatory Visit (INDEPENDENT_AMBULATORY_CARE_PROVIDER_SITE_OTHER): Payer: BC Managed Care – PPO | Admitting: Internal Medicine

## 2022-11-11 ENCOUNTER — Telehealth: Payer: BC Managed Care – PPO | Admitting: Internal Medicine

## 2022-11-11 VITALS — BP 130/80 | HR 79 | Temp 97.9°F | Wt 152.0 lb

## 2022-11-11 DIAGNOSIS — J189 Pneumonia, unspecified organism: Secondary | ICD-10-CM | POA: Diagnosis not present

## 2022-11-11 MED ORDER — AMOXICILLIN-POT CLAVULANATE 875-125 MG PO TABS: 1.0000 | ORAL_TABLET | Freq: Two times a day (BID) | ORAL | 0 refills | Status: DC

## 2022-11-11 NOTE — Progress Notes (Signed)
Acute office Visit     CC/Reason for Visit: Persistent cough  HPI: Leslie Reed is a 76 y.o. female who is coming in today for the above mentioned reasons.  She was diagnosed with COVID on December 3, she completed 5 days of Paxlovid.  She initially had improvement but then started to get worse.  She has had a cough that is productive of white to yellow sputum, significant headache and postnasal drip.  She has not had a fever.  All of her household contacts also tested positive for COVID.  One of her family members ended up being treated with antibiotics as well.  Tylenol Sinus helps but not completely.  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   Past Medical/Surgical History: Past Medical History:  Diagnosis Date   Allergic rhinitis    Allergy    Arthritis    hands, lower back   Asthma    Bronchitis    uses inhaler prn   Cataract    just being monitored - no surgery   Colitis 8/04   Diverticulitis    GERD (gastroesophageal reflux disease)    diet controlled, no meds   History of migraine headaches    Hx of colonic polyp    Hx of varicella    Hyperlipidemia    Insomnia    Interstitial cystitis 10/90   Mitral valve prolapse    Osteopenia    Scoliosis 2005   Sleep apnea    Tick bite 2014   STARI-antibiotics x 5 weeks   Tracheobronchopathia-osteochondroplastica     Past Surgical History:  Procedure Laterality Date   ABDOMINAL HYSTERECTOMY     partial  for pain no cancer     ADENOIDECTOMY     APPENDECTOMY     BREAST BIOPSY Right    x 2    BRONCHIAL WASHINGS  04/22/2020   Procedure: BRONCHIAL lavage;  Surgeon: Julian Hy, DO;  Location: [facility];  Service: Endoscopy;;   CESAREAN SECTION     x4   CHOLECYSTECTOMY     colon polyps     COLONOSCOPY  09/09/2016   Dr.Nandigam   DILATION AND CURETTAGE OF UTERUS     x 2   TONSILLECTOMY     VIDEO BRONCHOSCOPY N/A 04/22/2020   Procedure:  VIDEO BRONCHOSCOPY WITHOUT FLUORO;  Surgeon: Julian Hy, DO;  Location: [facility];  Service: Endoscopy;  Laterality: N/A;   WISDOM TOOTH EXTRACTION      Social History:  reports that she has never smoked. She has been exposed to tobacco smoke. She has never used smokeless tobacco. She reports current alcohol use. She reports that she does not use drugs.  Allergies: No Known Allergies  Family History:  Family History  Problem Relation Age of Onset   Diabetes Mother    Pulmonary fibrosis Mother        11 deceased   Hypertension Mother    Hypertension Father    Alzheimer's disease Father    Kidney failure Father    Diabetes Sister    Hypertension Brother    Sleep apnea Brother    Diabetes Maternal Grandmother    Colon cancer Maternal Grandfather    Colon polyps Daughter    Thyroid disease Daughter    Psoriasis Daughter    Thyroid disease Daughter    Diabetes Son    Allergy (severe) Son    Depression Son  Esophageal cancer Neg Hx    Rectal cancer Neg Hx    Stomach cancer Neg Hx      Current Outpatient Medications:    amoxicillin-clavulanate (AUGMENTIN) 875-125 MG tablet, Take 1 tablet by mouth 2 (two) times daily., Disp: 20 tablet, Rfl: 0   fluticasone (FLONASE) 50 MCG/ACT nasal spray, SHAKE LIQUID AND USE 2 SPRAYS IN EACH NOSTRIL DAILY, Disp: 48 g, Rfl: 0   ipratropium (ATROVENT) 0.03 % nasal spray, Place 1-2 sprays into both nostrils 2 (two) times daily as needed (nasal drainage)., Disp: 30 mL, Rfl: 5   Minoxidil (ROGAINE EX), Apply topically., Disp: , Rfl:    Respiratory Therapy Supplies (FLUTTER) DEVI, 1 each by Does not apply route 2 (two) times daily., Disp: 1 each, Rfl: 0   Vitamin D, Ergocalciferol, (DRISDOL) 1.25 MG (50000 UNIT) CAPS capsule, Take 1 capsule (50,000 Units total) by mouth every 14 (fourteen) days., Disp: 6 capsule, Rfl: 4  Current Facility-Administered Medications:    0.9 %  sodium chloride infusion, 500 mL, Intravenous, Continuous,  Nandigam, Kavitha V, MD  Review of Systems:  Constitutional: Denies fever, chills, diaphoresis. HEENT: Denies photophobia, eye pain, redness,  mouth sores, trouble swallowing, neck pain, neck stiffness and tinnitus.   Respiratory: Denies SOB, DOE, , chest tightness,  and wheezing.   Cardiovascular: Denies chest pain, palpitations and leg swelling.  Gastrointestinal: Denies nausea, vomiting, abdominal pain, diarrhea, constipation, blood in stool and abdominal distention.  Genitourinary: Denies dysuria, urgency, frequency, hematuria, flank pain and difficulty urinating.  Endocrine: Denies: hot or cold intolerance, sweats, changes in hair or nails, polyuria, polydipsia. Musculoskeletal: Denies myalgias, back pain, joint swelling, arthralgias and gait problem.  Skin: Denies pallor, rash and wound.  Neurological: Denies dizziness, seizures, syncope, weakness, light-headedness, numbness and headaches.  Hematological: Denies adenopathy. Easy bruising, personal or family bleeding history  Psychiatric/Behavioral: Denies suicidal ideation, mood changes, confusion, nervousness, sleep disturbance and agitation    Physical Exam: Vitals:   11/11/22 0808  BP: 130/80  Pulse: 79  Temp: 97.9 F (36.6 C)  TempSrc: Oral  SpO2: 97%  Weight: 152 lb (68.9 kg)    Body mass index is 26.09 kg/m.   Constitutional: NAD, calm, comfortable Eyes: PERRL, lids and conjunctivae normal, wears corrective lenses ENMT: Mucous membranes are moist. Posterior pharynx is erythematous but clear of any exudate or lesions. Normal dentition. Tympanic membrane is pearly white, no erythema or bulging. Respiratory: Bilateral rhonchi, more prevalent at the left lower base.  Normal respiratory effort. No accessory muscle use.  Cardiovascular: Regular rate and rhythm, no murmurs / rubs / gallops. No extremity edema.   Psychiatric: Normal judgment and insight. Alert and oriented x 3. Normal mood.    Impression and  Plan:  Community acquired pneumonia, unspecified laterality - Plan: amoxicillin-clavulanate (AUGMENTIN) 875-125 MG tablet  -She appears to have a secondary bacterial pneumonia to her initial COVID infection.  With her history of immunoglobulin deficiency I believe it is prudent to send her in a round of antibiotics.  I will send in Augmentin for 7 days.  Time spent:31 minutes reviewing chart, interviewing and examining patient and formulating plan of care.       Lelon Frohlich, MD [facility]

## 2022-12-14 DIAGNOSIS — H2512 Age-related nuclear cataract, left eye: Secondary | ICD-10-CM | POA: Diagnosis not present

## 2022-12-14 DIAGNOSIS — H4323 Crystalline deposits in vitreous body, bilateral: Secondary | ICD-10-CM | POA: Diagnosis not present

## 2022-12-14 DIAGNOSIS — H31092 Other chorioretinal scars, left eye: Secondary | ICD-10-CM | POA: Diagnosis not present

## 2022-12-14 DIAGNOSIS — H43813 Vitreous degeneration, bilateral: Secondary | ICD-10-CM | POA: Diagnosis not present

## 2022-12-19 NOTE — Progress Notes (Signed)
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 Wants to see what her levels are before restarting injections.  )  History of Present Illness: I had the pleasure of seeing Leslie Reed for a follow up visit at the [facility] [place] on 12/20/2022. She is a 77 y.o. female, who is being followed for hypogammaglobinemia, bronchiectasis and allergic rhinitis. Her previous allergy office visit was on 09/06/2022 with Dr. Maudie Mercury. Today is a regular follow up visit.  Hypogammaglobulinemia  Patient switched to Medicare but paying $800/month. She is not sure if it's worth it unless it covers Cuvitru. Patient had Covid-19 6 weeks ago and was treated with Paxlovid then symptoms flared. She was treated with Augmentin for possible secondary bacterial infection.  No prednisone for this.   Patient had Shingles and viral URI in 2023.   Up to date with RSV, Covid-19 and flu shots.  Bronchiectasis Coughing is slightly worse since Covid-19 and now coughing throughout the day. Patient is using flutter valve daily.  Nonallergic rhinitis Using Atrovent and Flonase prn with unknown benefit.   Assessment and Plan: [name] is a 77 y.o. female with: Hypogammaglobulinemia [facility]) Past history - Patient referred by pulmonology for low IgG and IgM. History of frequent respiratory infections, mild central bronchiectasis, mother who passed away from IPF. Up to date with COVID-19 vaccine, flu, pneumovax and prevnar.  2021 bloodwork diptheria, tetanus and s. Pneumonia titers adequate. Alpha 1 level and total complement level normal. No history of meningitis or blood clots. Arthralgias and headaches improved with Cuvitru 20g every 4 weeks. Last Cuvitru infusion in February 2023. Shingles in 2023. Interim  history - had Covid-19 in December and 1 course of antibiotics since last OV. 2023 IgG was 581. Keep track of infections and antibiotics use.  Get bloodwork. Will start paperwork to get Cuvitru approved.  Discussed risks and benefits.   Bronchiectasis without complication [facility]) Past history - 2022 spirometry was normal. Interim history - coughing worse now since Covid-19 infection in December.  Normal spirometry today. Follow up with pulmonology and their recommendations. Continue to use the flutter valve 1-2 times per day.  Consider vest therapy.   Nonallergic rhinitis Past history - Perennial rhinitis symptoms and using Vick's vapor rub. 2021 skin testing was negative to environmental allergies.  Interim history - some PND still.  Use Atrovent (ipratropium) 0.03% 1-2 sprays per nostril twice a day as needed for runny nose/drainage. Use Flonase (fluticasone) nasal spray 1 spray per nostril twice a day for nasal congestion.  Nasal saline spray (i.e., Simply Saline) or nasal saline lavage (i.e., NeilMed) is recommended as needed and prior to medicated nasal sprays.  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 Physical exam - left ear normal today. Check with dentist regarding TMJ.  Return in about 4 months (around 04/20/2023).  No orders of the defined types were placed in this encounter.  Lab Orders         IgG      Diagnostics: Spirometry:  Tracings reviewed. Her effort: Good reproducible efforts. FVC: 2.69L FEV1: 2.30L, 108% predicted FEV1/FVC ratio: 86% Interpretation: Spirometry consistent with normal pattern.  Please see scanned spirometry results for details.  Medication List:  Current Outpatient Medications  Medication Sig Dispense Refill   fluticasone (FLONASE) 50 MCG/ACT  nasal spray SHAKE LIQUID AND USE 2 SPRAYS IN EACH NOSTRIL DAILY 48 g 0   ipratropium (ATROVENT) 0.03 % nasal spray Place 1-2 sprays into both nostrils 2 (two) times daily as needed (nasal drainage).  (Patient not taking: Reported on 12/20/2022) 30 mL 5   Minoxidil (ROGAINE EX) Apply topically.     Respiratory Therapy Supplies (FLUTTER) DEVI 1 each by Does not apply route 2 (two) times daily. 1 each 0   Vitamin D, Ergocalciferol, (DRISDOL) 1.25 MG (50000 UNIT) CAPS capsule Take 1 capsule (50,000 Units total) by mouth every 14 (fourteen) days. 6 capsule 4   Current Facility-Administered Medications  Medication Dose Route Frequency Provider Last Rate Last Admin   0.9 %  sodium chloride infusion  500 mL Intravenous Continuous Nandigam, Kavitha V, MD       Allergies: No Known Allergies I reviewed her past medical history, social history, family history, and environmental history and no significant changes have been reported from her previous visit.  Review of Systems  Constitutional:  Negative for appetite change, chills, fever and unexpected weight change.  HENT:  Positive for ear pain and postnasal drip. Negative for congestion and rhinorrhea. Facial swelling: left side.  Eyes:  Negative for itching.  Respiratory:  Positive for cough. Negative for chest tightness, shortness of breath and wheezing.   Cardiovascular:  Negative for chest pain.  Gastrointestinal:  Negative for abdominal pain.  Genitourinary:  Negative for difficulty urinating.  Allergic/Immunologic: Negative for environmental allergies and food allergies.    Objective: BP 122/72   Pulse (!) 103   Temp 98.3 F (36.8 C) (Temporal)   Resp 16   Wt 154 lb 6.4 oz (70 kg)   LMP 11/29/1986   SpO2 94%   BMI 26.50 kg/m  Body mass index is 26.5 kg/m. Physical Exam Vitals and nursing note reviewed.  Constitutional:      Appearance: Normal appearance. She is well-developed.  HENT:     Head: Normocephalic and atraumatic.     Right Ear: Tympanic membrane and external ear normal.     Left Ear: Tympanic membrane and external ear normal.     Nose: Nose normal. No congestion or rhinorrhea.     Mouth/Throat:     Mouth: Mucous  membranes are moist.     Pharynx: Oropharynx is clear.  Eyes:     Conjunctiva/sclera: Conjunctivae normal.  Cardiovascular:     Rate and Rhythm: Normal rate and regular rhythm.     Heart sounds: Normal heart sounds. No murmur heard.    No friction rub. No gallop.  Pulmonary:     Effort: Pulmonary effort is normal.     Breath sounds: Normal breath sounds. No wheezing, rhonchi or rales.  Musculoskeletal:     Cervical back: Neck supple.  Skin:    General: Skin is warm.  Neurological:     Mental Status: She is alert and oriented to person, place, and time.  Psychiatric:        Mood and Affect: Mood normal.        Behavior: Behavior normal.    Previous notes and tests were reviewed. The plan was reviewed with the patient/family, and all questions/concerned were addressed.  It was my pleasure to see Leslie Reed today and participate in her care. Please feel free to contact me with any questions or concerns.  Sincerely,  Rexene Alberts, DO Allergy & Immunology  [facility] office: [place] office: [phone]

## 2022-12-20 ENCOUNTER — Ambulatory Visit (INDEPENDENT_AMBULATORY_CARE_PROVIDER_SITE_OTHER): Payer: BC Managed Care – PPO | Admitting: Allergy

## 2022-12-20 ENCOUNTER — Other Ambulatory Visit: Payer: Self-pay

## 2022-12-20 ENCOUNTER — Encounter: Payer: Self-pay | Admitting: Allergy

## 2022-12-20 VITALS — BP 122/72 | HR 103 | Temp 98.3°F | Resp 16 | Wt 154.4 lb

## 2022-12-20 DIAGNOSIS — J310 Chronic rhinitis: Secondary | ICD-10-CM | POA: Diagnosis not present

## 2022-12-20 DIAGNOSIS — D801 Nonfamilial hypogammaglobulinemia: Secondary | ICD-10-CM

## 2022-12-20 DIAGNOSIS — H9202 Otalgia, left ear: Secondary | ICD-10-CM | POA: Diagnosis not present

## 2022-12-20 DIAGNOSIS — J479 Bronchiectasis, uncomplicated: Secondary | ICD-10-CM

## 2022-12-20 NOTE — Assessment & Plan Note (Signed)
Past history - 2022 spirometry was normal. Interim history - coughing worse now since Covid-19 infection in December.  Normal spirometry today. Follow up with pulmonology and their recommendations. Continue to use the flutter valve 1-2 times per day.  Consider vest therapy.

## 2022-12-20 NOTE — Assessment & Plan Note (Signed)
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 Physical exam - left ear normal today. Check with dentist regarding TMJ.

## 2022-12-20 NOTE — Assessment & Plan Note (Signed)
Past history - Perennial rhinitis symptoms and using Vick's vapor rub. 2021 skin testing was negative to environmental allergies.  Interim history - some PND still.  Use Atrovent (ipratropium) 0.03% 1-2 sprays per nostril twice a day as needed for runny nose/drainage. Use Flonase (fluticasone) nasal spray 1 spray per nostril twice a day for nasal congestion.  Nasal saline spray (i.e., Simply Saline) or nasal saline lavage (i.e., NeilMed) is recommended as needed and prior to medicated nasal sprays.

## 2022-12-20 NOTE — Patient Instructions (Addendum)
Low IgG:  Keep track of infections and antibiotics use.  Get bloodwork. Will start paperwork to get Cuvitru approved.  Tammy will be in contact with you.   Rhinitis:  Use Atrovent (ipratropium) 0.03% 1-2 sprays per nostril twice a day as needed for runny nose/drainage. Use Flonase (fluticasone) nasal spray 1 spray per nostril twice a day for nasal congestion.  Nasal saline spray (i.e., Simply Saline) or nasal saline lavage (i.e., NeilMed) is recommended as needed and prior to medicated nasal sprays.  Coughing: Normal breathing test today. Follow up with pulmonology and their recommendations. Continue to use the flutter valve 1-2 times per day.  Consider vest therapy.   Left ear pain Check with dentist regarding TMJ.  Follow up in 4 months or sooner if needed.

## 2022-12-20 NOTE — Assessment & Plan Note (Signed)
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 History of frequent respiratory infections, mild central bronchiectasis, mother who passed away from IPF. Up to date with COVID-19 vaccine, flu, pneumovax and prevnar.  2021 bloodwork diptheria, tetanus and s. Pneumonia titers adequate. Alpha 1 level and total complement level normal. No history of meningitis or blood clots. Arthralgias and headaches improved with Cuvitru 20g every 4 weeks. Last Cuvitru infusion in February 2023. Shingles in 2023. Interim history - had Covid-19 in December and 1 course of antibiotics since last OV. 2023 IgG was 581. Keep track of infections and antibiotics use.  Get bloodwork. Will start paperwork to get Cuvitru approved.  Discussed risks and benefits.

## 2022-12-21 LAB — IGG: IgG (Immunoglobin G), Serum: 575 mg/dL — ABNORMAL LOW (ref 586–1602)

## 2022-12-22 ENCOUNTER — Telehealth: Payer: Self-pay | Admitting: *Deleted

## 2022-12-22 NOTE — Telephone Encounter (Signed)
-----  Message from Garnet Sierras, DO sent at 12/20/2022  3:29 PM EST ----- Please start PA for Cuvitru - patient was on 20g every 4 weeks. She changed her insurance to Medicare now. Thank you.

## 2022-12-22 NOTE — Telephone Encounter (Signed)
L/m for patient regarding restarting SCIG she has changed to MCR/supplement but still has commercial BCBS as primary coverage per [facility]

## 2022-12-22 NOTE — Telephone Encounter (Signed)
Advised patient to d/c commerical plan if she wants to go back on SCIG

## 2022-12-22 NOTE — Telephone Encounter (Signed)
-----  Message from Charlean Sanfilippo sent at 12/21/2022  4:46 PM EST ----- Regarding: insurance cards [place], please disregard my message about sheryl lynch I meant to send you this patient.   I scanned in her insurance cards

## 2023-01-10 ENCOUNTER — Ambulatory Visit: Payer: BC Managed Care – PPO | Admitting: Allergy

## 2023-01-31 ENCOUNTER — Telehealth: Payer: Self-pay | Admitting: *Deleted

## 2023-01-31 NOTE — Telephone Encounter (Signed)
Returned patient call about restarting SCIG therapy with new Ins change was waiting on her to cancel her primary BCBS to use the Medicare and supplement and now everything is handled. Will send new referral to [facility]

## 2023-02-07 NOTE — Telephone Encounter (Signed)
Forms have been signed by Dr. Maudie Mercury and faxed back to [facility].   FAX [phone])

## 2023-02-21 ENCOUNTER — Encounter: Payer: BC Managed Care – PPO | Admitting: Internal Medicine

## 2023-02-21 NOTE — Progress Notes (Signed)
Chief Complaint  Patient presents with   Annual Exam    HPI: Patient  Leslie Reed  77 y.o. comes in today for yearly  check with several issues  Allergy bronchiectasis:   stable but off Ig and sinus congestion   ongoing sees Dr Maudie Mercury   Kids want her to see neurology memory issues.   Word finding  not extensive  . ? Has to think a lot of  driving location.  Not getting  lost.  Told  not remembering  what was told not sure  Working still  caring for and managing properties  Father is had some  memory decline issues in 48 s  age 100  No falling  but no specific  balance . Climbs ladders   stiff in am.   Has PI using calamoiine    left arm area  dug in to see if fm some tenderness no pus   mild itch    HH of 3 husb and son  awaiting a pancreas transplant    out side cat.  Sleep : always an issue halting 6 at times. Has cpap .  Gets 4 .  Neg tad    Health Maintenance  Topic Date Due   Medicare Annual Wellness (AWV)  Never done   COVID-19 Vaccine (4 - 2023-24 season) 10/24/2022   DTaP/Tdap/Td (3 - Td or Tdap) 11/21/2031   Pneumonia Vaccine 7+ Years old  Completed   INFLUENZA VACCINE  Completed   DEXA SCAN  Completed   Hepatitis C Screening  Completed   Zoster Vaccines- Shingrix  Completed   HPV VACCINES  Aged Out   COLONOSCOPY (Pts 45-34yrs Insurance coverage will need to be confirmed)  Discontinued    Neg tad  [address]   ROS:  GEN/ HEENT: No fever, significant weight changes sweats headaches vision problems hearing changes, CV/ PULM; No chest pain shortness of breath cough, syncope,edema  change in exercise tolerance. GI /GU: No adominal pain, vomiting, change in bowel habits. No blood in the stool. No significant GU symptoms. SKIN/HEME: ,no acute skin rashes suspicious lesions or bleeding. No lymphadenopathy, nodules, masses.  NEURO/ PSYCH:  No neurologic signs such as weakness numbness.  IMM/ Allergy: No unusual infections.  Allergy .   REST of 12 system review  negative except as per HPI   Past Medical History:  Diagnosis Date   Allergic rhinitis    Allergy    Arthritis    hands, lower back   Asthma    Bronchitis    uses inhaler prn   Cataract    just being monitored - no surgery   Colitis 8/04   Diverticulitis    GERD (gastroesophageal reflux disease)    diet controlled, no meds   History of migraine headaches    Hx of colonic polyp    Hx of varicella    Hyperlipidemia    Insomnia    Interstitial cystitis 10/90   Mitral valve prolapse    Osteopenia    Scoliosis 2005   Sleep apnea    Tick bite 2014   STARI-antibiotics x 5 weeks   Tracheobronchopathia-osteochondroplastica     Past Surgical History:  Procedure Laterality Date   ABDOMINAL HYSTERECTOMY     partial  for pain no cancer     ADENOIDECTOMY     APPENDECTOMY     BREAST BIOPSY Right    x 2    BRONCHIAL WASHINGS  04/22/2020   Procedure: BRONCHIAL lavage;  Surgeon:  Julian Hy, DO;  Location: [facility];  Service: Endoscopy;;   CESAREAN SECTION     x4   CHOLECYSTECTOMY     colon polyps     COLONOSCOPY  09/09/2016   Dr.Nandigam   DILATION AND CURETTAGE OF UTERUS     x 2   TONSILLECTOMY     VIDEO BRONCHOSCOPY N/A 04/22/2020   Procedure: VIDEO BRONCHOSCOPY WITHOUT FLUORO;  Surgeon: Julian Hy, DO;  Location: [facility];  Service: Endoscopy;  Laterality: N/A;   WISDOM TOOTH EXTRACTION      Family History  Problem Relation Age of Onset   Diabetes Mother    Pulmonary fibrosis Mother        72 deceased   Hypertension Mother    Hypertension Father    Alzheimer's disease Father    Kidney failure Father    Diabetes Sister    Hypertension Brother    Sleep apnea Brother    Diabetes Maternal Grandmother    Colon cancer Maternal Grandfather    Colon polyps Daughter    Thyroid disease Daughter    Psoriasis Daughter    Thyroid disease Daughter    Diabetes Son    Allergy (severe) Son    Depression Son    Esophageal cancer Neg Hx    Rectal cancer  Neg Hx    Stomach cancer Neg Hx     Social History   Socioeconomic History   Marital status: Married    Spouse name: Not on file   Number of children: Not on file   Years of education: Not on file   Highest education level: Master's degree (e.g., MA, MS, MEng, MEd, MSW, MBA)  Occupational History   Not on file  Tobacco Use   Smoking status: Never    Passive exposure: Yes   Smokeless tobacco: Never  Vaping Use   Vaping Use: Never used  Substance and Sexual Activity   Alcohol use: Yes    Comment: occ   Drug use: No   Sexual activity: Not Currently    Partners: Male    Birth control/protection: Surgical, Post-menopausal    Comment: TAH  Other Topics Concern   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   hhof 2   G6 p4    Pet cats    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    [address] 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   Neg td    Sleep 4-6 interrupted    Family is WISC father with alzheimers   Social Determinants of Health   Financial Resource Strain: Low Risk  (04/27/2022)   Overall Financial Resource Strain (CARDIA)    Difficulty of Paying Living Expenses: Not hard at all  Food Insecurity: No Food Insecurity (04/27/2022)   Hunger Vital Sign    Worried About Running Out of Food in the Last Year: Never true    [place] in the Last Year: Never true  Transportation Needs: No Transportation Needs (04/27/2022)   PRAPARE - Hydrologist (Medical): No    Lack of Transportation (Non-Medical): No  Physical Activity: Insufficiently Active (04/27/2022)   Exercise Vital Sign    Days of Exercise per Week: 1 day    Minutes of Exercise per Session: 30 min  Stress: Stress Concern Present (04/27/2022)   [place]    Feeling of Stress : To some extent  Social Connections: Moderately Isolated (04/27/2022)  Social Licensed conveyancer [NHANES]     Frequency of Communication with Friends and Family: More than three times a week    Frequency of Social Gatherings with Friends and Family: More than three times a week    Attends Religious Services: Never    Marine scientist or Organizations: No    Attends Music therapist: Not on file    Marital Status: Married    Outpatient Medications Prior to Visit  Medication Sig Dispense Refill   fluticasone (FLONASE) 50 MCG/ACT nasal spray SHAKE LIQUID AND USE 2 SPRAYS IN EACH NOSTRIL DAILY 48 g 0   Minoxidil (ROGAINE EX) Apply topically.     Respiratory Therapy Supplies (FLUTTER) DEVI 1 each by Does not apply route 2 (two) times daily. (Patient taking differently: 1 each daily.) 1 each 0   Vitamin D, Ergocalciferol, (DRISDOL) 1.25 MG (50000 UNIT) CAPS capsule Take 1 capsule (50,000 Units total) by mouth every 14 (fourteen) days. 6 capsule 4   ipratropium (ATROVENT) 0.03 % nasal spray Place 1-2 sprays into both nostrils 2 (two) times daily as needed (nasal drainage). (Patient not taking: Reported on 12/20/2022) 30 mL 5   Facility-Administered Medications Prior to Visit  Medication Dose Route Frequency Provider Last Rate Last Admin   0.9 %  sodium chloride infusion  500 mL Intravenous Continuous Nandigam, Kavitha V, MD         EXAM:  BP 138/82 (BP Location: Left Arm, Cuff Size: Normal)   Pulse 74   Temp (!) 97.4 F (36.3 C) (Oral)   Ht 5\' 4"  (1.626 m)   Wt 149 lb 9.6 oz (67.9 kg)   LMP 11/29/1986   SpO2 96%   BMI 25.68 kg/m   Body mass index is 25.68 kg/m. Wt Readings from Last 3 Encounters:  02/22/23 149 lb 9.6 oz (67.9 kg)  12/20/22 154 lb 6.4 oz (70 kg)  11/11/22 152 lb (68.9 kg)    Physical Exam: Vital signs reviewed [id number] is a well-developed well-nourished alert cooperative    who appearsr stated age in no acute distress. Mild congestion  HEENT: normocephalic atraumatic , Eyes: PERRL EOM's full, conjunctiva clear, Nares: paten,t no deformity discharge or  tenderness., Ears: no deformity EAC's clear TMs with normal landmarks. Mouth: clear OP, no lesions, edema.  Moist mucous membranes. Dentition in adequate repair. NECK: supple without masses, thyromegaly or bruits. CHEST/PULM:  Clear to auscultation and percussion breath sounds equal no wheeze , rales or rhonchi. No chest wall deformities or tenderness. Breast: normal by inspection . No dimpling, discharge, masses, tenderness or discharge . CV: PMI is nondisplaced, S1 S2 no gallops, murmurs, rubs. Peripheral pulses are full without delay.No JVD .  ABDOMEN: Bowel sounds normal nontender  No guard or rebound, no hepato splenomegal no CVA tenderness.  Extremtities:  No clubbing cyanosis or edema, no acute joint swelling or redness no focal atrophy NEURO:  Oriented x3, cranial nerves 3-12 appear to be intact, no obvious focal weakness,gait within normal limits no abnormal reflexes or asymmetrical SKIN: No acute rashes normal turgor, color, no bruising or petechiae. Fading cd on forarms  one area  with central  dot and  round erythema no streaking or fluctuance  PSYCH: Oriented, good eye contact, no obvious depression anxiety, cognition and judgment appear normal. LN: no cervical axillary  adenopathy  Lab Results  Component Value Date   WBC 4.9 02/22/2023   HGB 13.8 02/22/2023   HCT 41.5 02/22/2023   PLT 326.0 02/22/2023  GLUCOSE 86 02/22/2023   CHOL 202 (H) 02/22/2023   TRIG 120.0 02/22/2023   HDL 50.50 02/22/2023   LDLDIRECT 138.0 10/13/2011   LDLCALC 128 (H) 02/22/2023   ALT 21 02/22/2023   AST 18 02/22/2023   NA 141 02/22/2023   K 4.1 02/22/2023   CL 105 02/22/2023   CREATININE 0.82 02/22/2023   BUN 18 02/22/2023   CO2 25 02/22/2023   TSH 1.05 02/22/2023   HGBA1C 5.5 02/10/2021    BP Readings from Last 3 Encounters:  02/22/23 138/82  12/20/22 122/72  11/11/22 130/80    Lab plan  reviewed with patient   ASSESSMENT AND PLAN:  Discussed the following assessment and plan:     ICD-10-CM   1. Memory difficulty  R41.3 Vitamin D, 99991111    Basic metabolic panel    CBC with Differential/Platelet    Hepatic function panel    Lipid panel    TSH    T4, free    Vitamin B12    2. Medication management  Z79.899 Vitamin D, 99991111    Basic metabolic panel    CBC with Differential/Platelet    Hepatic function panel    Lipid panel    TSH    T4, free    Vitamin B12    3. Hyperlipidemia, unspecified hyperlipidemia type  E78.5 Vitamin D, 99991111    Basic metabolic panel    CBC with Differential/Platelet    Hepatic function panel    Lipid panel    TSH    T4, free    Vitamin B12    4. Hypogammaglobulinemia (HCC)  D80.1 Vitamin D, 99991111    Basic metabolic panel    CBC with Differential/Platelet    Hepatic function panel    Lipid panel    TSH    T4, free    Vitamin B12    5. Vitamin D deficiency  E55.9 Vitamin D, 99991111    Basic metabolic panel    CBC with Differential/Platelet    Hepatic function panel    Lipid panel    TSH    T4, free    Vitamin B12    6. Plant dermatitis  L25.5    local care if one area gets painful looking infected contact and can add antibiotic    7. OSA on CPAP  G47.33     Overall  prev concerns and felt  not sig but  some concerns about worked finding and having to think carefully about directions   fully functioning otherwise  May have  some anxiety  but not  severe.  Seems to have an active mind( like  adhd)  PLan referral to neuro /neuropsych  disc about eachj specialty  record review saw dohmier years ago about memory and under cpap osa 2019  insomnia no other dx  Uncertain of lab time   update lab  rsh vit b12 etc   Record revewi counsel plan refer   33 miinutes  Return in about 1 year (around 02/22/2024) for depending on results.  Patient Care Team: Keely Drennan, Standley Brooking, MD as PCP - General (Internal Medicine) Stefanie Libel, MD (Family Medicine) Megan Salon, MD (Gynecology) Dohmeier, Asencion Partridge, MD  (Neurology) Garnet Sierras, DO as Consulting Physician (Allergy) Patient Instructions  Good to see you today . Updated lab monitoring Will do referral  either to  neurology and or neuropsychology  for the [facility] concerns .  Have dr Sabra Heck tranfer the vit d rx to  avoid confusion.  Standley Brooking. Shailey Butterbaugh M.D.

## 2023-02-22 ENCOUNTER — Ambulatory Visit: Payer: Medicare Other | Admitting: Internal Medicine

## 2023-02-22 ENCOUNTER — Encounter: Payer: Self-pay | Admitting: Internal Medicine

## 2023-02-22 VITALS — BP 138/82 | HR 74 | Temp 97.4°F | Ht 64.0 in | Wt 149.6 lb

## 2023-02-22 DIAGNOSIS — E559 Vitamin D deficiency, unspecified: Secondary | ICD-10-CM

## 2023-02-22 DIAGNOSIS — E785 Hyperlipidemia, unspecified: Secondary | ICD-10-CM | POA: Diagnosis not present

## 2023-02-22 DIAGNOSIS — Z79899 Other long term (current) drug therapy: Secondary | ICD-10-CM

## 2023-02-22 DIAGNOSIS — D801 Nonfamilial hypogammaglobulinemia: Secondary | ICD-10-CM

## 2023-02-22 DIAGNOSIS — L255 Unspecified contact dermatitis due to plants, except food: Secondary | ICD-10-CM

## 2023-02-22 DIAGNOSIS — R413 Other amnesia: Secondary | ICD-10-CM

## 2023-02-22 DIAGNOSIS — G4733 Obstructive sleep apnea (adult) (pediatric): Secondary | ICD-10-CM

## 2023-02-22 DIAGNOSIS — Z0000 Encounter for general adult medical examination without abnormal findings: Secondary | ICD-10-CM

## 2023-02-22 LAB — TSH: TSH: 1.05 u[IU]/mL (ref 0.35–5.50)

## 2023-02-22 LAB — LIPID PANEL
Cholesterol: 202 mg/dL — ABNORMAL HIGH (ref 0–200)
HDL: 50.5 mg/dL (ref >39.00)
LDL Cholesterol: 128 mg/dL — ABNORMAL HIGH (ref 0–99)
NonHDL: 151.9
Total CHOL/HDL Ratio: 4
Triglycerides: 120 mg/dL (ref 0.0–149.0)
VLDL: 24 mg/dL (ref 0.0–40.0)

## 2023-02-22 LAB — BASIC METABOLIC PANEL
BUN: 18 mg/dL (ref 6–23)
CO2: 25 mEq/L (ref 19–32)
Calcium: 9.4 mg/dL (ref 8.4–10.5)
Chloride: 105 mEq/L (ref 96–112)
Creatinine, Ser: 0.82 mg/dL (ref 0.40–1.20)
GFR: 69.73 mL/min (ref >60.00)
Glucose, Bld: 86 mg/dL (ref 70–99)
Potassium: 4.1 mEq/L (ref 3.5–5.1)
Sodium: 141 mEq/L (ref 135–145)

## 2023-02-22 LAB — T4, FREE: Free T4: 0.94 ng/dL (ref 0.60–1.60)

## 2023-02-22 LAB — CBC WITH DIFFERENTIAL/PLATELET
Basophils Absolute: 0 10*3/uL (ref 0.0–0.1)
Basophils Relative: 0.9 % (ref 0.0–3.0)
Eosinophils Absolute: 0.1 10*3/uL (ref 0.0–0.7)
Eosinophils Relative: 1.4 % (ref 0.0–5.0)
HCT: 41.5 % (ref 36.0–46.0)
Hemoglobin: 13.8 g/dL (ref 12.0–15.0)
Lymphocytes Relative: 25.6 % (ref 12.0–46.0)
Lymphs Abs: 1.3 10*3/uL (ref 0.7–4.0)
MCHC: 33.4 g/dL (ref 30.0–36.0)
MCV: 90.6 fl (ref 78.0–100.0)
Monocytes Absolute: 0.5 10*3/uL (ref 0.1–1.0)
Monocytes Relative: 10.7 % (ref 3.0–12.0)
Neutro Abs: 3 10*3/uL (ref 1.4–7.7)
Neutrophils Relative %: 61.4 % (ref 43.0–77.0)
Platelets: 326 10*3/uL (ref 150.0–400.0)
RBC: 4.58 Mil/uL (ref 3.87–5.11)
RDW: 14.1 % (ref 11.5–15.5)
WBC: 4.9 10*3/uL (ref 4.0–10.5)

## 2023-02-22 LAB — VITAMIN D 25 HYDROXY (VIT D DEFICIENCY, FRACTURES): VITD: 25.28 ng/mL — ABNORMAL LOW (ref 30.00–100.00)

## 2023-02-22 LAB — HEPATIC FUNCTION PANEL
ALT: 21 U/L (ref 0–35)
AST: 18 U/L (ref 0–37)
Albumin: 4.4 g/dL (ref 3.5–5.2)
Alkaline Phosphatase: 75 U/L (ref 39–117)
Bilirubin, Direct: 0.1 mg/dL (ref 0.0–0.3)
Total Bilirubin: 0.5 mg/dL (ref 0.2–1.2)
Total Protein: 6.7 g/dL (ref 6.0–8.3)

## 2023-02-22 LAB — VITAMIN B12: Vitamin B-12: 230 pg/mL (ref 211–911)

## 2023-02-22 MED ORDER — FLUOCINONIDE 0.05 % EX OINT: 1.0000 | TOPICAL_OINTMENT | Freq: Two times a day (BID) | CUTANEOUS | 0 refills | Status: DC

## 2023-02-22 NOTE — Patient Instructions (Addendum)
Good to see you today . Updated lab monitoring Will do referral  either to  neurology and or neuropsychology  for the [facility] concerns .  Have dr Sabra Heck tranfer the vit d rx to  avoid confusion.

## 2023-03-02 ENCOUNTER — Encounter: Payer: Self-pay | Admitting: Internal Medicine

## 2023-03-04 ENCOUNTER — Telehealth (HOSPITAL_BASED_OUTPATIENT_CLINIC_OR_DEPARTMENT_OTHER): Payer: Self-pay | Admitting: *Deleted

## 2023-03-04 DIAGNOSIS — E559 Vitamin D deficiency, unspecified: Secondary | ICD-10-CM

## 2023-03-04 MED ORDER — VITAMIN D (ERGOCALCIFEROL) 1.25 MG (50000 UNIT) PO CAPS: 50000.0000 [IU] | ORAL_CAPSULE | ORAL | 1 refills | Status: DC

## 2023-03-04 NOTE — Telephone Encounter (Signed)
Pt called needing Vit D prescription to be sent to a different pharmacy.  Pt due for annual end of July 2024.  Vit D 50,000 units [facility]. KW CMA

## 2023-03-07 ENCOUNTER — Telehealth: Payer: Self-pay | Admitting: Internal Medicine

## 2023-03-07 NOTE — Telephone Encounter (Signed)
Contacted Leslie Reed to schedule their annual wellness visit. Welcome to Medicare visit Due by 11/30/23.  [facility] AWV direct phone # [phone]   WTM before 11/30/23 per [place]

## 2023-05-17 NOTE — Progress Notes (Unsigned)
Follow Up Note  RE: [name] MRN: [id number] DOB: 1946-10-03 Date of Office Visit: 05/18/2023  Referring provider: [name], MD Primary care provider: [name], MD  Chief Complaint: No chief complaint on file.  History of Present Illness: I had the pleasure of seeing Leslie Reed for a follow up visit at the [facility] on 05/17/2023. She is a 77 y.o. female, who is being followed for hypogammaglobinemia, bronchiectasis, and nonallergic rhinitis. Her previous allergy office visit was on 12/20/2022 with Dr. Selena Batten. Today is a regular follow up visit.  Hypogammaglobulinemia [facility]) Past history - Patient referred by pulmonology for low IgG and IgM. History of frequent respiratory infections, mild central bronchiectasis, mother who passed away from IPF. Up to date with COVID-19 vaccine, flu, pneumovax and prevnar.  2021 bloodwork diptheria, tetanus and s. Pneumonia titers adequate. Alpha 1 level and total complement level normal. No history of meningitis or blood clots. Arthralgias and headaches improved with Cuvitru 20g every 4 weeks. Last Cuvitru infusion in February 2023. Shingles in 2023. Interim history - had Covid-19 in December and 1 course of antibiotics since last OV. 2023 IgG was 581. Keep track of infections and antibiotics use.  Get bloodwork. Will start paperwork to get Cuvitru approved.  Discussed risks and benefits.    Bronchiectasis without complication [facility]) Past history - 2022 spirometry was normal. Interim history - coughing worse now since Covid-19 infection in December.  Normal spirometry today. Follow up with pulmonology and their recommendations. Continue to use the flutter valve 1-2 times per day.  Consider vest therapy.    Nonallergic rhinitis Past history - Perennial rhinitis symptoms and using Vick's vapor rub. 2021 skin testing was negative to environmental allergies.  Interim history - some PND still.  Use Atrovent  (ipratropium) 0.03% 1-2 sprays per nostril twice a day as needed for runny nose/drainage. Use Flonase (fluticasone) nasal spray 1 spray per nostril twice a day for nasal congestion.  Nasal saline spray (i.e., Simply Saline) or nasal saline lavage (i.e., NeilMed) is recommended as needed and prior to medicated nasal sprays.   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 Physical exam - left ear normal today. Check with dentist regarding TMJ.  Component     Latest Ref Rng 04/01/2020 01/28/2021 05/01/2021 08/12/2021 12/23/2021  IgG (Immunoglobin G), Serum     586 - 1,602 mg/dL 782 (L)  9,562  1,308  1,028  925   IgG (Immunoglobin G), Serum      519 (L)        Component     Latest Ref Rng 04/21/2022 09/06/2022 12/20/2022  IgG (Immunoglobin G), Serum     586 - 1,602 mg/dL 657  846 (L)  962 (L)     Assessment and Plan: Isel is a 77 y.o. female with: No problem-specific Assessment & Plan notes found for this encounter.  No follow-ups on file.  No orders of the defined types were placed in this encounter.  Lab Orders  No laboratory test(s) ordered today    Diagnostics: Spirometry:  Tracings reviewed. Her effort: {Blank single:19197::"Good reproducible efforts.","It was hard to get consistent efforts and there is a question as to whether this reflects a maximal maneuver.","Poor effort, data can not be interpreted."} FVC: ***L FEV1: ***L, ***% predicted FEV1/FVC ratio: ***% Interpretation: {Blank single:19197::"Spirometry consistent with mild obstructive disease","Spirometry consistent with moderate obstructive disease","Spirometry consistent with severe obstructive disease","Spirometry consistent with possible restrictive disease","Spirometry consistent with mixed obstructive and  restrictive disease","Spirometry uninterpretable due to technique","Spirometry consistent with normal pattern","No overt abnormalities noted given today's efforts"}.  Please see scanned spirometry results for details.  Skin  Testing: {Blank single:19197::"Select foods","Environmental allergy panel","Environmental allergy panel and select foods","Food allergy panel","None","Deferred due to recent antihistamines use"}. *** Results discussed with patient/family.   Medication List:  Current Outpatient Medications  Medication Sig Dispense Refill   fluocinonide ointment (LIDEX) 0.05 % Apply 1 Application topically 2 (two) times daily. For poison ivy 30 g 0   fluticasone (FLONASE) 50 MCG/ACT nasal spray SHAKE LIQUID AND USE 2 SPRAYS IN EACH NOSTRIL DAILY 48 g 0   ipratropium (ATROVENT) 0.03 % nasal spray Place 1-2 sprays into both nostrils 2 (two) times daily as needed (nasal drainage). (Patient not taking: Reported on 12/20/2022) 30 mL 5   Minoxidil (ROGAINE EX) Apply topically.     Respiratory Therapy Supplies (FLUTTER) DEVI 1 each by Does not apply route 2 (two) times daily. (Patient taking differently: 1 each daily.) 1 each 0   Vitamin D, Ergocalciferol, (DRISDOL) 1.25 MG (50000 UNIT) CAPS capsule Take 1 capsule (50,000 Units total) by mouth every 14 (fourteen) days. 6 capsule 1   Current Facility-Administered Medications  Medication Dose Route Frequency Provider Last Rate Last Admin   0.9 %  sodium chloride infusion  500 mL Intravenous Continuous Nandigam, Kavitha V, MD       Allergies: No Known Allergies I reviewed her past medical history, social history, family history, and environmental history and no significant changes have been reported from her previous visit.  Review of Systems  Constitutional:  Negative for appetite change, chills, fever and unexpected weight change.  HENT:  Positive for ear pain and postnasal drip. Negative for congestion and rhinorrhea. Facial swelling: left side.  Eyes:  Negative for itching.  Respiratory:  Positive for cough. Negative for chest tightness, shortness of breath and wheezing.   Cardiovascular:  Negative for chest pain.  Gastrointestinal:  Negative for abdominal pain.   Genitourinary:  Negative for difficulty urinating.  Allergic/Immunologic: Negative for environmental allergies and food allergies.    Objective: LMP 11/29/1986  There is no height or weight on file to calculate BMI. Physical Exam Vitals and nursing note reviewed.  Constitutional:      Appearance: Normal appearance. She is well-developed.  HENT:     Head: Normocephalic and atraumatic.     Right Ear: Tympanic membrane and external ear normal.     Left Ear: Tympanic membrane and external ear normal.     Nose: Nose normal. No congestion or rhinorrhea.     Mouth/Throat:     Mouth: Mucous membranes are moist.     Pharynx: Oropharynx is clear.  Eyes:     Conjunctiva/sclera: Conjunctivae normal.  Cardiovascular:     Rate and Rhythm: Normal rate and regular rhythm.     Heart sounds: Normal heart sounds. No murmur heard.    No friction rub. No gallop.  Pulmonary:     Effort: Pulmonary effort is normal.     Breath sounds: Normal breath sounds. No wheezing, rhonchi or rales.  Musculoskeletal:     Cervical back: Neck supple.  Skin:    General: Skin is warm.  Neurological:     Mental Status: She is alert and oriented to person, place, and time.  Psychiatric:        Mood and Affect: Mood normal.        Behavior: Behavior normal.    Previous notes and tests were reviewed. The plan was  reviewed with the patient/family, and all questions/concerned were addressed.  It was my pleasure to see Milove today and participate in her care. Please feel free to contact me with any questions or concerns.  Sincerely,  Wyline Mood, DO Allergy & Immunology  [facility] office: [phone] [facility] office: [phone]

## 2023-05-18 ENCOUNTER — Other Ambulatory Visit: Payer: Self-pay

## 2023-05-18 ENCOUNTER — Encounter: Payer: Self-pay | Admitting: Allergy

## 2023-05-18 ENCOUNTER — Ambulatory Visit (INDEPENDENT_AMBULATORY_CARE_PROVIDER_SITE_OTHER): Payer: Medicare Other | Admitting: Allergy

## 2023-05-18 VITALS — BP 130/76 | HR 67 | Temp 98.1°F | Wt 150.2 lb

## 2023-05-18 DIAGNOSIS — J310 Chronic rhinitis: Secondary | ICD-10-CM | POA: Diagnosis not present

## 2023-05-18 DIAGNOSIS — D801 Nonfamilial hypogammaglobulinemia: Secondary | ICD-10-CM | POA: Diagnosis not present

## 2023-05-18 DIAGNOSIS — J479 Bronchiectasis, uncomplicated: Secondary | ICD-10-CM

## 2023-05-18 NOTE — Assessment & Plan Note (Signed)
Past history - 2022 spirometry was normal. Interim history - coughing in the mornings.  Follow up with pulmonology and their recommendations. Continue to use the flutter valve 1-2 times per day.

## 2023-05-18 NOTE — Assessment & Plan Note (Addendum)
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 History of frequent respiratory infections, mild central bronchiectasis, mother who passed away from IPF. Up to date with COVID-19 vaccine, flu, pneumovax and prevnar.  2021 bloodwork diptheria, tetanus and s. Pneumonia titers adequate. Alpha 1 level and total complement level normal. No history of meningitis or blood clots. Arthralgias and headaches improved with Cuvitru 20g every 4 weeks. Last Cuvitru infusion in February 2023. Shingles in 2023. Interim history - having difficulty getting Cuvitru covered. Careful about getting sick as son is on transplant list. IgG has been trending down.  Keep track of infections and antibiotics use.  Get bloodwork. Call the contact person regarding the infusions.  Get annual flu vaccine.

## 2023-05-18 NOTE — Patient Instructions (Addendum)
Low IgG:  Keep track of infections and antibiotics use.  Get bloodwork. Call the contact person regarding the infusions.   Rhinitis:  Use Atrovent (ipratropium) 0.03% 1-2 sprays per nostril twice a day as needed for runny nose/drainage. Use Flonase (fluticasone) nasal spray 1 spray per nostril twice a day for nasal congestion.  Nasal saline spray (i.e., Simply Saline) or nasal saline lavage (i.e., NeilMed) is recommended as needed and prior to medicated nasal sprays.  Coughing: Follow up with pulmonology and their recommendations. Continue to use the flutter valve 1-2 times per day.   Follow up in 4 months or sooner if needed. Get annual flu vaccine.

## 2023-05-18 NOTE — Assessment & Plan Note (Signed)
Past history - Perennial rhinitis symptoms and using Vick's vapor rub. 2021 skin testing was negative to environmental allergies.  Interim history - some PND still.  Use Atrovent (ipratropium) 0.03% 1-2 sprays per nostril twice a day as needed for runny nose/drainage. Use Flonase (fluticasone) nasal spray 1 spray per nostril twice a day for nasal congestion.  Nasal saline spray (i.e., Simply Saline) or nasal saline lavage (i.e., NeilMed) is recommended as needed and prior to medicated nasal sprays. 

## 2023-05-19 LAB — IGG: IgG (Immunoglobin G), Serum: 611 mg/dL (ref 586–1602)

## 2023-06-06 NOTE — Progress Notes (Signed)
So I don"t see that the neurology appt referral was completed  from this march visit  regarding memory complaints. Please have team investigate . And complete referral if patient still wishes

## 2023-06-22 NOTE — Telephone Encounter (Signed)
Spoke to patient at length regarding issues that Ins will no longer approve SCIG that her IgM was low in 2021 but now normal and IgG not low enough she is going to give up on trying to get therapy until her labs down them line may indicate need again

## 2023-07-14 ENCOUNTER — Ambulatory Visit (HOSPITAL_BASED_OUTPATIENT_CLINIC_OR_DEPARTMENT_OTHER): Payer: BC Managed Care – PPO | Admitting: Obstetrics & Gynecology

## 2023-08-09 ENCOUNTER — Ambulatory Visit (HOSPITAL_BASED_OUTPATIENT_CLINIC_OR_DEPARTMENT_OTHER): Payer: Medicare Other | Admitting: Obstetrics & Gynecology

## 2023-08-09 ENCOUNTER — Encounter (HOSPITAL_BASED_OUTPATIENT_CLINIC_OR_DEPARTMENT_OTHER): Payer: Self-pay | Admitting: Obstetrics & Gynecology

## 2023-08-09 VITALS — BP 130/80 | HR 73 | Ht 64.0 in | Wt 145.2 lb

## 2023-08-09 DIAGNOSIS — E2839 Other primary ovarian failure: Secondary | ICD-10-CM

## 2023-08-09 DIAGNOSIS — E559 Vitamin D deficiency, unspecified: Secondary | ICD-10-CM

## 2023-08-09 DIAGNOSIS — Z90710 Acquired absence of both cervix and uterus: Secondary | ICD-10-CM | POA: Diagnosis not present

## 2023-08-09 DIAGNOSIS — Z01419 Encounter for gynecological examination (general) (routine) without abnormal findings: Secondary | ICD-10-CM | POA: Diagnosis not present

## 2023-08-09 DIAGNOSIS — M8580 Other specified disorders of bone density and structure, unspecified site: Secondary | ICD-10-CM | POA: Diagnosis not present

## 2023-08-09 MED ORDER — VITAMIN D (ERGOCALCIFEROL) 1.25 MG (50000 UNIT) PO CAPS: 50000.0000 [IU] | ORAL_CAPSULE | ORAL | 1 refills | Status: DC

## 2023-08-09 NOTE — Progress Notes (Signed)
77 y.o. [id number] Married White or Caucasian female here for breast and pelvic exam.  Denies vaginal bleeding.    Son is now on the pancreatic transplant list at [facility].  She is hoping they will get a call in the next few months.  They do have hope about this helping    Is taking Vit D.  Needs RF.  Had this check 01/2023.    Patient's last menstrual period was 11/29/1986.          Sexually active: No.  H/O STD:  no  Health Maintenance: PCP:  Dr. Fabian Sharp.  Last wellness appt was 01/2023.  Did blood work at that appt:  yes Vaccines are up to date:  yes Colonoscopy:  12/07/21.  No screening follow up. recommended.   MMG:  08/27/22 BMD:  07/2020, -2.1 Last pap smear:  2009, hysterectomy.   H/o abnormal pap smear: no     reports that she has never smoked. She has been exposed to tobacco smoke. She has never used smokeless tobacco. She reports current alcohol use. She reports that she does not use drugs.  Past Medical History:  Diagnosis Date   Allergic rhinitis    Allergy    Arthritis    hands, lower back   Asthma    Bronchitis    uses inhaler prn   Cataract    just being monitored - no surgery   Colitis 8/04   Diverticulitis    GERD (gastroesophageal reflux disease)    diet controlled, no meds   History of migraine headaches    Hx of colonic polyp    Hx of varicella    Hyperlipidemia    Insomnia    Interstitial cystitis 10/90   Mitral valve prolapse    Osteopenia    Scoliosis 2005   Sleep apnea    Tick bite 2014   STARI-antibiotics x 5 weeks   Tracheobronchopathia-osteochondroplastica     Past Surgical History:  Procedure Laterality Date   ABDOMINAL HYSTERECTOMY     partial  for pain no cancer     ADENOIDECTOMY     APPENDECTOMY     BREAST BIOPSY Right    x 2    BRONCHIAL WASHINGS  04/22/2020   Procedure: BRONCHIAL lavage;  Surgeon: Steffanie Dunn, DO;  Location: [facility];  Service: Endoscopy;;   CESAREAN SECTION     x4   CHOLECYSTECTOMY     colon polyps      COLONOSCOPY  09/09/2016   Dr.Nandigam   DILATION AND CURETTAGE OF UTERUS     x 2   TONSILLECTOMY     VIDEO BRONCHOSCOPY N/A 04/22/2020   Procedure: VIDEO BRONCHOSCOPY WITHOUT FLUORO;  Surgeon: Steffanie Dunn, DO;  Location: [facility];  Service: Endoscopy;  Laterality: N/A;   WISDOM TOOTH EXTRACTION      Current Outpatient Medications  Medication Sig Dispense Refill   fluticasone (FLONASE) 50 MCG/ACT nasal spray SHAKE LIQUID AND USE 2 SPRAYS IN EACH NOSTRIL DAILY 48 g 0   ipratropium (ATROVENT) 0.03 % nasal spray Place 1-2 sprays into both nostrils 2 (two) times daily as needed (nasal drainage). 30 mL 5   Minoxidil (ROGAINE EX) Apply topically.     Respiratory Therapy Supplies (FLUTTER) DEVI 1 each by Does not apply route 2 (two) times daily. (Patient taking differently: 1 each daily.) 1 each 0   Vitamin D, Ergocalciferol, (DRISDOL) 1.25 MG (50000 UNIT) CAPS capsule Take 1 capsule (50,000 Units total) by mouth every 14 (fourteen) days. 6  capsule 1   Current Facility-Administered Medications  Medication Dose Route Frequency Provider Last Rate Last Admin   0.9 %  sodium chloride infusion  500 mL Intravenous Continuous Nandigam, Eleonore Chiquito, MD        Family History  Problem Relation Age of Onset   Diabetes Mother    Pulmonary fibrosis Mother        52 deceased   Hypertension Mother    Hypertension Father    Alzheimer's disease Father    Kidney failure Father    Diabetes Sister    Hypertension Brother    Sleep apnea Brother    Diabetes Maternal Grandmother    Colon cancer Maternal Grandfather    Colon polyps Daughter    Thyroid disease Daughter    Psoriasis Daughter    Thyroid disease Daughter    Diabetes Son    Allergy (severe) Son    Depression Son    Esophageal cancer Neg Hx    Rectal cancer Neg Hx    Stomach cancer Neg Hx     Review of Systems  Exam:   LMP 11/29/1986      General appearance: alert, cooperative and appears stated age Breasts: normal appearance,  no masses or tenderness Abdomen: soft, non-tender; bowel sounds normal; no masses,  no organomegaly Lymph nodes: Cervical, supraclavicular, and axillary nodes normal.  No abnormal inguinal nodes palpated Neurologic: Grossly normal  Pelvic: External genitalia:  no lesions              Urethra:  normal appearing urethra with no masses, tenderness or lesions              Bartholins and Skenes: normal                 Vagina: normal appearing vagina with atrophic changes and no discharge, no lesions              Cervix: absent              Pap taken: No. Bimanual Exam:  Uterus:  uterus absent              Adnexa: no mass, fullness, tenderness               Rectovaginal: Confirms               Anus:  normal sphincter tone, no lesions  Chaperone, Hendricks Milo, CMA, was present for exam.  Assessment/Plan: 1. Encntr for gyn exam (general) (routine) w/o abn findings - Pap smear not indicated - Mammogram 07/2022 - Colonoscopy 11/2021.  No screening follow up recommended. - Bone mineral density ordered - lab work done with PCP, Dr. Fabian Sharp - vaccines reviewed/updated  2. Vitamin D deficiency - Vitamin D, Ergocalciferol, (DRISDOL) 1.25 MG (50000 UNIT) CAPS capsule; Take 1 capsule (50,000 Units total) by mouth every 14 (fourteen) days.  Dispense: 6 capsule; Refill: 1  3. Osteopenia, unspecified location - DG BONE DENSITY (DXA); Future  4. H/O: hysterectomy  5. Hypoestrogenism - DG BONE DENSITY (DXA); Future

## 2023-08-15 NOTE — Progress Notes (Unsigned)
PATIENT: Leslie Reed DOB: February 20, 1946  REASON FOR VISIT: follow up HISTORY FROM: patient Primary neurologist: Dr. Vickey Huger  HISTORY OF PRESENT ILLNESS: Today 08/15/23:  Leslie Reed is a 77 y.o. female with a history of OSA on CPAP. Returns today for follow-up.      08/12/22:   Leslie Reed is a 77 year old female with a history of obstructive sleep apnea on CPAP.  She returns today for follow-up.  She states that her sleep has improved slightly since she started using melatonin as needed.  Typically only uses 1 mg.  She does note that in the middle of the night if she wakes up she may read the newspaper before she goes back to sleep.  She does keep her CPAP on but does notice that the events jump up while she is awake.  Her download is below    05/06/21: Leslie Reed is a 77 year old female with a history of obstructive sleep apnea on CPAP.  She returns today for follow-up.  She reports that the CPAP is working well for her.  Reports that she is having some issues with fatigue that her PCP is doing blood work.  She reports that she does have a hard time sleeping for long periods of time.  Tends to wake up every 2 hours.  She returns today for an evaluation.   04/30/20 Leslie Reed is a 77 year old female with a history of obstructive sleep apnea on CPAP.  Her download indicates that she use her machine 27 out of 30 days for compliance of 90%.  She is averaging greater than 4 hours each night.  On average she uses her machine 4 hours and 58 minutes.  Her residual AHI is 2.3 on 5 to 15 cm of water with EPR 3.  Leak in the 95th percentile is 26.8 L/min.  Reports that the CPAP works fairly well for her.  She does note that she is not changed her supplies out since she received them.  She does have a so clean machine but has never used it.  HISTORY (Copied from Dr.Dohmeier's note) Interval history from 07 December 2018 I had the pleasure of seeing Leslie Reed today who had since I have seen her  last undergone a home sleep test by watch Dennie Bible on 07 December 2017 exactly a year ago home sleep test was based on an elevated fatigue severity score at 38 points and resulted in an AHI of 7.4, RDI was 8.6, there was no prolonged oxygenation time, heart rate was 59 bpm with a tendency to be bradycardic.  This was followed by a an in lab test was ordered for his REM behavior disorder and was refused by her insurance.  She started on an auto titration CPAP between 5 and 13 cmH2O following the home sleep test results there is 3 cm expiratory pressure relief set for her as well she has achieved and 97% compliance and has been compliant in the last 2 visits with Darrol Angel, NP.  Average use of time is 5 hours 3 minutes result residual AHI is 2.3 which is excellent the residual apneas consist mostly of obstructive apneas telling me that she could handle a little bit higher pressure well.  Epworth sleepiness score was reduced to 8, fatigue severity was still high at 42 and the geriatric depression score today was 4 out of 15 points.  We also addressed her mild cognitive impairment history which I think is partially due to overwhelming concern in regards  to her son's failing health.  REVIEW OF SYSTEMS: Out of a complete 14 system review of symptoms, the patient complains only of the following symptoms, and all other reviewed systems are negative.  ESS 9 FSS 54  ALLERGIES: No Known Allergies  HOME MEDICATIONS: Outpatient Medications Prior to Visit  Medication Sig Dispense Refill   fluticasone (FLONASE) 50 MCG/ACT nasal spray SHAKE LIQUID AND USE 2 SPRAYS IN EACH NOSTRIL DAILY 48 g 0   ipratropium (ATROVENT) 0.03 % nasal spray Place 1-2 sprays into both nostrils 2 (two) times daily as needed (nasal drainage). 30 mL 5   Minoxidil (ROGAINE EX) Apply topically.     Respiratory Therapy Supplies (FLUTTER) DEVI 1 each by Does not apply route 2 (two) times daily. (Patient taking differently: 1 each daily.) 1 each 0    Vitamin D, Ergocalciferol, (DRISDOL) 1.25 MG (50000 UNIT) CAPS capsule Take 1 capsule (50,000 Units total) by mouth every 14 (fourteen) days. 6 capsule 1   Facility-Administered Medications Prior to Visit  Medication Dose Route Frequency Provider Last Rate Last Admin   0.9 %  sodium chloride infusion  500 mL Intravenous Continuous Nandigam, Eleonore Chiquito, MD        PAST MEDICAL HISTORY: Past Medical History:  Diagnosis Date   Allergic rhinitis    Allergy    Arthritis    hands, lower back   Asthma    Bronchitis    uses inhaler prn   Cataract    just being monitored - no surgery   Colitis 8/04   Diverticulitis    GERD (gastroesophageal reflux disease)    diet controlled, no meds   History of migraine headaches    Hx of colonic polyp    Hx of varicella    Hyperlipidemia    Insomnia    Interstitial cystitis 10/90   Mitral valve prolapse    Osteopenia    Scoliosis 2005   Sleep apnea    Tick bite 2014   STARI-antibiotics x 5 weeks   Tracheobronchopathia-osteochondroplastica     PAST SURGICAL HISTORY: Past Surgical History:  Procedure Laterality Date   ABDOMINAL HYSTERECTOMY     partial  for pain no cancer     ADENOIDECTOMY     APPENDECTOMY     BREAST BIOPSY Right    x 2    BRONCHIAL WASHINGS  04/22/2020   Procedure: BRONCHIAL lavage;  Surgeon: Steffanie Dunn, DO;  Location: [facility];  Service: Endoscopy;;   CESAREAN SECTION     x4   CHOLECYSTECTOMY     colon polyps     COLONOSCOPY  09/09/2016   Dr.Nandigam   DILATION AND CURETTAGE OF UTERUS     x 2   TONSILLECTOMY     VIDEO BRONCHOSCOPY N/A 04/22/2020   Procedure: VIDEO BRONCHOSCOPY WITHOUT FLUORO;  Surgeon: Steffanie Dunn, DO;  Location: [facility];  Service: Endoscopy;  Laterality: N/A;   WISDOM TOOTH EXTRACTION      FAMILY HISTORY: Family History  Problem Relation Age of Onset   Diabetes Mother    Pulmonary fibrosis Mother        34 deceased   Hypertension Mother    Hypertension Father     Alzheimer's disease Father    Kidney failure Father    Diabetes Sister    Hypertension Brother    Sleep apnea Brother    Diabetes Maternal Grandmother    Colon cancer Maternal Grandfather    Colon polyps Daughter    Thyroid disease Daughter  Psoriasis Daughter    Thyroid disease Daughter    Diabetes Son    Allergy (severe) Son    Depression Son    Esophageal cancer Neg Hx    Rectal cancer Neg Hx    Stomach cancer Neg Hx     SOCIAL HISTORY: Social History   Socioeconomic History   Marital status: Married    Spouse name: Not on file   Number of children: Not on file   Years of education: Not on file   Highest education level: Master's degree (e.g., MA, MS, MEng, MEd, MSW, MBA)  Occupational History   Not on file  Tobacco Use   Smoking status: Never    Passive exposure: Yes   Smokeless tobacco: Never  Vaping Use   Vaping status: Never Used  Substance and Sexual Activity   Alcohol use: Yes    Comment: occ   Drug use: No   Sexual activity: Not Currently    Partners: Male    Birth control/protection: Surgical, Post-menopausal    Comment: TAH  Other Topics Concern   Not on file  Social History Narrative   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   hhof 2   G6 p4    Pet cats    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    [address] 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   Neg td    Sleep 4-6 interrupted    Family is WISC father with alzheimers   Social Determinants of Health   Financial Resource Strain: Low Risk  (04/27/2022)   Overall Financial Resource Strain (CARDIA)    Difficulty of Paying Living Expenses: Not hard at all  Food Insecurity: No Food Insecurity (04/27/2022)   Hunger Vital Sign    Worried About Running Out of Food in the Last Year: Never true    Ran Out of Food in the Last Year: Never true  Transportation Needs: No Transportation Needs (04/27/2022)   PRAPARE - Administrator, Civil Service (Medical): No    Lack of Transportation (Non-Medical): No   Physical Activity: Insufficiently Active (04/27/2022)   Exercise Vital Sign    Days of Exercise per Week: 1 day    Minutes of Exercise per Session: 30 min  Stress: Stress Concern Present (04/27/2022)   [employer] of Occupational Health - Occupational Stress Questionnaire    Feeling of Stress : To some extent  Social Connections: Moderately Isolated (04/27/2022)   Social Connection and Isolation Panel [NHANES]    Frequency of Communication with Friends and Family: More than three times a week    Frequency of Social Gatherings with Friends and Family: More than three times a week    Attends Religious Services: Never    Database administrator or Organizations: No    Attends Engineer, structural: Not on file    Marital Status: Married  Catering manager Violence: Not on file      PHYSICAL EXAM  There were no vitals filed for this visit.  There is no height or weight on file to calculate BMI.  Generalized: Well developed, in no acute distress  Chest: Lungs clear to auscultation bilaterally  Neurological examination  Mentation: Alert oriented to time, place, history taking. Follows all commands speech and language fluent Cranial nerve II-XII: Extraocular movements were full, visual field were full on confrontational test Head turning and shoulder shrug  were normal and symmetric. Motor: The motor testing reveals 5 over 5 strength of all 4 extremities. Good symmetric motor tone is  noted throughout.  Sensory: Sensory testing is intact to soft touch on all 4 extremities. No evidence of extinction is noted.  Gait and station: Gait is normal.    DIAGNOSTIC DATA (LABS, IMAGING, TESTING) - I reviewed patient records, labs, notes, testing and imaging myself where available.  Lab Results  Component Value Date   WBC 4.9 02/22/2023   HGB 13.8 02/22/2023   HCT 41.5 02/22/2023   MCV 90.6 02/22/2023   PLT 326.0 02/22/2023      Component Value Date/Time   NA 141 02/22/2023  1051   NA 145 (H) 04/21/2022 1543   K 4.1 02/22/2023 1051   CL 105 02/22/2023 1051   CO2 25 02/22/2023 1051   GLUCOSE 86 02/22/2023 1051   BUN 18 02/22/2023 1051   BUN 13 04/21/2022 1543   CREATININE 0.82 02/22/2023 1051   CREATININE 0.66 07/01/2015 1415   CALCIUM 9.4 02/22/2023 1051   PROT 6.7 02/22/2023 1051   PROT 6.6 04/21/2022 1543   ALBUMIN 4.4 02/22/2023 1051   ALBUMIN 4.5 04/21/2022 1543   AST 18 02/22/2023 1051   ALT 21 02/22/2023 1051   ALKPHOS 75 02/22/2023 1051   BILITOT 0.5 02/22/2023 1051   BILITOT 0.4 04/21/2022 1543   GFRNONAA 89.85 02/24/2010 1118   Lab Results  Component Value Date   CHOL 202 (H) 02/22/2023   HDL 50.50 02/22/2023   LDLCALC 128 (H) 02/22/2023   LDLDIRECT 138.0 10/13/2011   TRIG 120.0 02/22/2023   CHOLHDL 4 02/22/2023   Lab Results  Component Value Date   HGBA1C 5.5 02/10/2021   Lab Results  Component Value Date   VITAMINB12 230 02/22/2023   Lab Results  Component Value Date   TSH 1.05 02/22/2023      ASSESSMENT AND PLAN 77 y.o. year old female  has a past medical history of Allergic rhinitis, Allergy, Arthritis, Asthma, Bronchitis, Cataract, Colitis (8/04), Diverticulitis, GERD (gastroesophageal reflux disease), History of migraine headaches, colonic polyp, varicella, Hyperlipidemia, Insomnia, Interstitial cystitis (10/90), Mitral valve prolapse, Osteopenia, Scoliosis (2005), Sleep apnea, Tick bite (2014), and Tracheobronchopathia-osteochondroplastica. here with:  OSA on CPAP  - CPAP compliance excellent - Good treatment of AHI  - Encourage patient to use CPAP nightly and > 4 hours each night -Discussed good sleep hygiene -Advised that she could try melatonin 1 to 3 mg 1 to 2 hours before bedtime - F/U in 1 year or sooner if needed    Butch Penny, MSN, NP-C 08/15/2023, 3:20 PM [facility] [address], [address] [place], [place] [address] [phone]

## 2023-08-16 ENCOUNTER — Ambulatory Visit (INDEPENDENT_AMBULATORY_CARE_PROVIDER_SITE_OTHER): Payer: Medicare Other | Admitting: Adult Health

## 2023-08-16 ENCOUNTER — Encounter: Payer: Self-pay | Admitting: Adult Health

## 2023-08-16 VITALS — BP 119/72 | HR 76 | Ht 64.0 in | Wt 143.0 lb

## 2023-08-16 DIAGNOSIS — G4733 Obstructive sleep apnea (adult) (pediatric): Secondary | ICD-10-CM

## 2023-08-23 ENCOUNTER — Telehealth: Payer: Self-pay | Admitting: Adult Health

## 2023-08-23 NOTE — Telephone Encounter (Signed)
HST- Medicare/ACE supp no auth req   Patient is scheduled at [facility] for 09/05/23 at 9:30 AM  Mailed packet to the patient.

## 2023-09-06 ENCOUNTER — Ambulatory Visit: Payer: Medicare Other | Admitting: Neurology

## 2023-09-06 DIAGNOSIS — J4521 Mild intermittent asthma with (acute) exacerbation: Secondary | ICD-10-CM

## 2023-09-06 DIAGNOSIS — G4733 Obstructive sleep apnea (adult) (pediatric): Secondary | ICD-10-CM

## 2023-09-06 DIAGNOSIS — G540 Brachial plexus disorders: Secondary | ICD-10-CM

## 2023-09-12 NOTE — Progress Notes (Signed)
[facility]  Leslie Reed 77 year old female February 23, 1946 Comm Pref:  MyChart   HOME SLEEP TEST REPORT ( by Watch PAT)   STUDY DATE:  09-08-2023   ORDERING CLINICIAN:  REFERRING CLINICIAN: Butch Penny, NP at [facility]    CLINICAL INFORMATION/HISTORY: 08-16-2023 Pt here for CPAP f/u Pt states same since last office visit .Pt asking for new CPAP machine , current machine has a set up date 12/27/2017. Pt has chronic insomnia. [name] is a 77 y.o. female with a history of OSA on CPAP. Returns today for follow-up.  Her CPAP report is below.  She states that she is due for new machine.  She was told in the past her water chamber was not sealing correctly which may be why she is having a leak.  Denies any new symptoms.  High compliance on CPAP- 5-15 cm water, 3 cm EPR and heated humidification. AHI 3.3/h.     Epworth sleepiness score: ESS 9 FSS 54   BMI: 24.5 kg/m   Neck Circumference: na   FINDINGS:   Sleep Summary:   Total Recording Time (hours, min): 8 hours 27 minutes      Total Sleep Time (hours, min):   7 hours 8 minutes              Percent REM (%):   22%                                     Respiratory Indices:   Calculated pAHI (per hour):    By AASM criteria 16.1/h,   following CMS criteria this AHI is only 5.9/h                         REM pAHI:   By CMS criteria 8.5/h                                              NREM pAHI: By CMS criteria 5.2/h                            Supine AHI:   By CMS criteria 6.6/h.  Please note a significant discrepancy to the AASM criteria in supine sleep the AHI would be 19.4/h, the RDI 25.1/h.  There was also left-sided sleep noted with an AHI of 2.6/h by CMS criteria.  Snoring statistics show a mean volume of 41 dB and snoring is present for about a quarter of total recorded sleep time.                                                Oxygen Saturation Statistics:     O2 Saturation Range (%):    Between a nadir  at 88 and a maximum saturation of 98% with a mean saturation of 93%                                   O2 Saturation (minutes) <89%:   0.1-minute  Pulse Rate Statistics:   Pulse Mean (bpm):   49 bpm              Pulse Range:   With a trend to bradycardia the minimum heart rate is 38 bpm and the maximum heart rate 76 bpm.              IMPRESSION:  This HST confirms the presence of mild to moderate obstructive sleep apnea following the AASM criteria we have compared the data to. Bradycardia is intermittently noted, no significant hypoxia is seen. Snoring is mild to moderate. Insomnia is noted between 2:30 AM and about 4:30 AM with many prolonged arousals , WASO time.    RECOMMENDATION: The patient is doing well with CPAP and I would renew the prescription at 5 through 15 cm water pressure 3 cm EPR, and interface of patient's choice.  Heated  humidification will be provided.    INTERPRETING PHYSICIAN:   Melvyn Novas, MD

## 2023-09-12 NOTE — Procedures (Signed)
[facility]  Leslie Reed 77 year old female 11-Mar-1946 Comm Pref:  MyChart   HOME SLEEP TEST REPORT ( by Watch PAT)   STUDY DATE:  09-08-2023   ORDERING CLINICIAN:  REFERRING CLINICIAN: Butch Penny, NP at [facility]    CLINICAL INFORMATION/HISTORY: 08-16-2023 Pt here for CPAP f/u Pt states same since last office visit .Pt asking for new CPAP machine , current machine has a set up date 12/27/2017. Pt has chronic insomnia. Leslie Reed is a 77 y.o. female with a history of OSA on CPAP. Returns today for follow-up.  Her CPAP report is below.  She states that she is due for new machine.  She was told in the past her water chamber was not sealing correctly which may be why she is having a leak.  Denies any new symptoms.  High compliance on CPAP- 5-15 cm water, 3 cm EPR and heated humidification. AHI 3.3/h.     Epworth sleepiness score: ESS 9 FSS 54   BMI: 24.5 kg/m   Neck Circumference: na   FINDINGS:   Sleep Summary:   Total Recording Time (hours, min): 8 hours 27 minutes      Total Sleep Time (hours, min):   7 hours 8 minutes              Percent REM (%):   22%                                     Respiratory Indices:   Calculated pAHI (per hour):    By AASM criteria 16.1/h,   following CMS criteria this AHI is only 5.9/h                         REM pAHI:   By CMS criteria 8.5/h                                              NREM pAHI: By CMS criteria 5.2/h                            Supine AHI:   By CMS criteria 6.6/h.  Please note a significant discrepancy to the AASM criteria in supine sleep the AHI would be 19.4/h, the RDI 25.1/h.  There was also left-sided sleep noted with an AHI of 2.6/h by CMS criteria.  Snoring statistics show a mean volume of 41 dB and snoring is present for about a quarter of total recorded sleep time.                                                Oxygen Saturation Statistics:     O2 Saturation Range (%):    Between a nadir at 88  and a maximum saturation of 98% with a mean saturation of 93%                                   O2 Saturation (minutes) <89%:   0.1-minute  Pulse Rate Statistics:   Pulse Mean (bpm):   49 bpm              Pulse Range:   With a trend to bradycardia the minimum heart rate is 38 bpm and the maximum heart rate 76 bpm.              IMPRESSION:  This HST confirms the presence of mild to moderate obstructive sleep apnea following the AASM criteria we have compared the data to. Bradycardia is intermittently noted, no significant hypoxia is seen. Snoring is mild to moderate. Insomnia is noted between 2:30 AM and about 4:30 AM with many prolonged arousals , WASO time.    RECOMMENDATION: The patient is doing well with CPAP and I would renew the prescription at 5 through 15 cm water pressure 3 cm EPR, and interface of patient's choice.  Heated  humidification will be provided.    INTERPRETING PHYSICIAN:   Melvyn Novas, MD      [facility]

## 2023-09-13 ENCOUNTER — Encounter (HOSPITAL_BASED_OUTPATIENT_CLINIC_OR_DEPARTMENT_OTHER): Payer: Self-pay | Admitting: *Deleted

## 2023-09-13 DIAGNOSIS — E2839 Other primary ovarian failure: Secondary | ICD-10-CM

## 2023-09-13 DIAGNOSIS — M8580 Other specified disorders of bone density and structure, unspecified site: Secondary | ICD-10-CM

## 2023-09-18 NOTE — Progress Notes (Unsigned)
Follow Up Note  RE: [name] MRN: [id number] DOB: 18-Feb-1946 Date of Office Visit: 09/19/2023  Referring provider: [name], MD Primary care provider: [name], MD  Chief Complaint: No chief complaint on file.  History of Present Illness: I had the pleasure of seeing Leslie Reed for a follow up visit at the [facility] on 09/18/2023. She is a 77 y.o. female, who is being followed for hypogammaglobinemia, bronchiectasis, and chronic rhinitis. Her previous allergy office visit was on 05/18/2023 with Dr. Selena Batten. Today is a regular follow up visit.  Discussed the use of AI scribe software for clinical note transcription with the patient, who gave verbal consent to proceed.  History of Present Illness            Hypogammaglobulinemia [facility]) Past history - Patient referred by pulmonology for low IgG and IgM. History of frequent respiratory infections, mild central bronchiectasis, mother who passed away from IPF. Up to date with COVID-19 vaccine, flu, pneumovax and prevnar.  2021 bloodwork diptheria, tetanus and s. Pneumonia titers adequate. Alpha 1 level and total complement level normal. No history of meningitis or blood clots. Arthralgias and headaches improved with Cuvitru 20g every 4 weeks. Last Cuvitru infusion in February 2023. Shingles in 2023. Interim history - having difficulty getting Cuvitru covered. Careful about getting sick as son is on transplant list. IgG has been trending down.  Keep track of infections and antibiotics use.  Get bloodwork. Call the contact person regarding the infusions.  Get annual flu vaccine.   Bronchiectasis without complication (HCC) Past history - 2022 spirometry was normal. Interim history - coughing in the mornings.  Follow up with pulmonology and their recommendations. Continue to use the flutter valve 1-2 times per day.    Nonallergic rhinitis Past history - Perennial rhinitis symptoms and using Vick's  vapor rub. 2021 skin testing was negative to environmental allergies.  Interim history - some PND still.  Use Atrovent (ipratropium) 0.03% 1-2 sprays per nostril twice a day as needed for runny nose/drainage. Use Flonase (fluticasone) nasal spray 1 spray per nostril twice a day for nasal congestion.  Nasal saline spray (i.e., Simply Saline) or nasal saline lavage (i.e., NeilMed) is recommended as needed and prior to medicated nasal sprays.   Component     Latest Ref Rng 04/01/2020 01/28/2021 05/01/2021 08/12/2021 12/23/2021  IgG (Immunoglobin G), Serum     586 - 1,602 mg/dL 401 (L)  0,272  5,366  1,028  925   IgG (Immunoglobin G), Serum      519 (L)        Component     Latest Ref Rng 04/21/2022 09/06/2022 12/20/2022 05/18/2023  IgG (Immunoglobin G), Serum     586 - 1,602 mg/dL 440  347 (L)  425 (L)  611    Assessment and Plan: Leslie Reed is a 77 y.o. female with: Hypogammaglobulinemia (HCC) Past history - Patient referred by pulmonology for low IgG and IgM. History of frequent respiratory infections, mild central bronchiectasis, mother who passed away from IPF. Up to date with COVID-19 vaccine, flu, pneumovax and prevnar.  2021 bloodwork diptheria, tetanus and s. Pneumonia titers adequate. Alpha 1 level and total complement level normal. No history of meningitis or blood clots. Arthralgias and headaches improved with Cuvitru 20g every 4 weeks. Last Cuvitru infusion in February 2023. Shingles in 2023. Interim history -   Bronchiectasis without complication [facility]) Past history - 2022 spirometry normal. Interim history -   Chronic  rhinitis Past history - Perennial rhinitis symptoms and using Vick's vapor rub. 2021 skin testing negative to environmental allergies.  Interim history -  Assessment and Plan              No follow-ups on file.  No orders of the defined types were placed in this encounter.  Lab Orders  No laboratory test(s) ordered today    Diagnostics: Spirometry:  Tracings  reviewed. Her effort: {Blank single:19197::"Good reproducible efforts.","It was hard to get consistent efforts and there is a question as to whether this reflects a maximal maneuver.","Poor effort, data can not be interpreted."} FVC: ***L FEV1: ***L, ***% predicted FEV1/FVC ratio: ***% Interpretation: {Blank single:19197::"Spirometry consistent with mild obstructive disease","Spirometry consistent with moderate obstructive disease","Spirometry consistent with severe obstructive disease","Spirometry consistent with possible restrictive disease","Spirometry consistent with mixed obstructive and restrictive disease","Spirometry uninterpretable due to technique","Spirometry consistent with normal pattern","No overt abnormalities noted given today's efforts"}.  Please see scanned spirometry results for details.  Skin Testing: {Blank single:19197::"Select foods","Environmental allergy panel","Environmental allergy panel and select foods","Food allergy panel","None","Deferred due to recent antihistamines use"}. *** Results discussed with patient/family.   Medication List:  Current Outpatient Medications  Medication Sig Dispense Refill  . fluticasone (FLONASE) 50 MCG/ACT nasal spray SHAKE LIQUID AND USE 2 SPRAYS IN EACH NOSTRIL DAILY 48 g 0  . ipratropium (ATROVENT) 0.03 % nasal spray Place 1-2 sprays into both nostrils 2 (two) times daily as needed (nasal drainage). 30 mL 5  . Minoxidil (ROGAINE EX) Apply topically.    Marland Kitchen Respiratory Therapy Supplies (FLUTTER) DEVI 1 each by Does not apply route 2 (two) times daily. (Patient taking differently: 1 each daily.) 1 each 0  . Vitamin D, Ergocalciferol, (DRISDOL) 1.25 MG (50000 UNIT) CAPS capsule Take 1 capsule (50,000 Units total) by mouth every 14 (fourteen) days. 6 capsule 1   Current Facility-Administered Medications  Medication Dose Route Frequency Provider Last Rate Last Admin  . 0.9 %  sodium chloride infusion  500 mL Intravenous Continuous Nandigam,  Kavitha V, MD       Allergies: No Known Allergies I reviewed her past medical history, social history, family history, and environmental history and no significant changes have been reported from her previous visit.  Review of Systems  Constitutional:  Negative for appetite change, chills, fever and unexpected weight change.  HENT:  Positive for postnasal drip. Negative for congestion and rhinorrhea.   Eyes:  Negative for itching.  Respiratory:  Positive for cough. Negative for chest tightness, shortness of breath and wheezing.   Cardiovascular:  Negative for chest pain.  Gastrointestinal:  Negative for abdominal pain.  Genitourinary:  Negative for difficulty urinating.  Allergic/Immunologic: Negative for environmental allergies and food allergies.   Objective: LMP 11/29/1986  There is no height or weight on file to calculate BMI. Physical Exam Vitals and nursing note reviewed.  Constitutional:      Appearance: Normal appearance. She is well-developed.  HENT:     Head: Normocephalic and atraumatic.     Right Ear: Tympanic membrane and external ear normal.     Left Ear: Tympanic membrane and external ear normal.     Nose: Nose normal. No congestion or rhinorrhea.     Mouth/Throat:     Mouth: Mucous membranes are moist.     Pharynx: Oropharynx is clear.  Eyes:     Conjunctiva/sclera: Conjunctivae normal.  Cardiovascular:     Rate and Rhythm: Normal rate and regular rhythm.     Heart sounds: Normal heart sounds. No murmur  heard.    No friction rub. No gallop.  Pulmonary:     Effort: Pulmonary effort is normal.     Breath sounds: Normal breath sounds. No wheezing, rhonchi or rales.  Musculoskeletal:     Cervical back: Neck supple.  Skin:    General: Skin is warm.  Neurological:     Mental Status: She is alert and oriented to person, place, and time.  Psychiatric:        Mood and Affect: Mood normal.        Behavior: Behavior normal.  Previous notes and tests were  reviewed. The plan was reviewed with the patient/family, and all questions/concerned were addressed.  It was my pleasure to see Leslie Reed today and participate in her care. Please feel free to contact me with any questions or concerns.  Sincerely,  Wyline Mood, DO Allergy & Immunology  [facility] office: [phone] [facility] office: [phone]

## 2023-09-18 NOTE — Addendum Note (Signed)
Addended by: Enedina Finner on: 09/18/2023 01:51 PM   Modules accepted: Orders

## 2023-09-19 ENCOUNTER — Other Ambulatory Visit: Payer: Self-pay

## 2023-09-19 ENCOUNTER — Ambulatory Visit (INDEPENDENT_AMBULATORY_CARE_PROVIDER_SITE_OTHER): Payer: Medicare Other | Admitting: Allergy

## 2023-09-19 ENCOUNTER — Encounter: Payer: Self-pay | Admitting: Allergy

## 2023-09-19 VITALS — BP 140/80 | HR 70 | Temp 98.1°F | Resp 20 | Ht 64.0 in | Wt 138.8 lb

## 2023-09-19 DIAGNOSIS — J479 Bronchiectasis, uncomplicated: Secondary | ICD-10-CM | POA: Diagnosis not present

## 2023-09-19 DIAGNOSIS — D801 Nonfamilial hypogammaglobulinemia: Secondary | ICD-10-CM | POA: Diagnosis not present

## 2023-09-19 DIAGNOSIS — J310 Chronic rhinitis: Secondary | ICD-10-CM

## 2023-09-19 NOTE — Patient Instructions (Addendum)
Low IgG:  Keep track of infections and antibiotics use.   Rhinitis:  Use Atrovent (ipratropium) 0.03% 1-2 sprays per nostril twice a day as needed for runny nose/drainage. Use Flonase (fluticasone) nasal spray 1-2 sprays per nostril once a day as needed for nasal congestion.  Nasal saline spray (i.e., Simply Saline) or nasal saline lavage (i.e., NeilMed) is recommended as needed and prior to medicated nasal sprays.  Coughing: Monitor symptoms.  Continue to use the flutter valve 1-2 times per day - use in the mornings.   Follow up in 6 months or sooner if needed. Follow up with PCP regarding the memory issues and referral to neurology.

## 2023-09-19 NOTE — Telephone Encounter (Signed)
Leslie Reed, CMA  New, Wyvonnia Dusky, Efraim Kaufmann; Angus Seller, [place] New orders have been placed for the above pt, DOB: Dec 03, 2045 Thanks  New, Maryruth Bun, Abbe Amsterdam, CMA; Allean Found Tomie China; 1 other Received, thank you!

## 2023-10-21 ENCOUNTER — Telehealth: Payer: Self-pay | Admitting: Adult Health

## 2023-10-21 NOTE — Telephone Encounter (Signed)
Pt calling to schedule Initial CPAP. [employer] called pt and inform her she needs to schedule Initial CPAP with provider for the insurance between 11/10/23-01/08/24. Would like a call back to discuss if can get an appt with Megan before the end of the year. Will be changing insurance the first of the year.

## 2023-10-24 NOTE — Telephone Encounter (Signed)
Pt has been scheduled for 15 min VV on Monday 12/16 at 11:45 AM.

## 2023-11-10 ENCOUNTER — Encounter: Payer: Self-pay | Admitting: Anesthesiology

## 2023-11-14 ENCOUNTER — Telehealth: Payer: Medicare Other | Admitting: Adult Health

## 2023-11-14 ENCOUNTER — Telehealth: Payer: Self-pay | Admitting: Adult Health

## 2023-11-14 DIAGNOSIS — G4733 Obstructive sleep apnea (adult) (pediatric): Secondary | ICD-10-CM | POA: Diagnosis not present

## 2023-11-14 NOTE — Telephone Encounter (Signed)
Request to change 1 yr f/u to in office appt.

## 2023-11-14 NOTE — Progress Notes (Signed)
PATIENT: Leslie Reed DOB: 1946-07-20  REASON FOR VISIT: follow up HISTORY FROM: patient PRIMARY NEUROLOGIST:   Virtual Visit via Video Note  I connected with Pricilla Holm on 11/14/23 at  3:15 PM EST by a video enabled telemedicine application located remotely at [facility] and verified that I am speaking with the correct person using two identifiers who was located at their own home.   I discussed the limitations of evaluation and management by telemedicine and the availability of in person appointments. The patient expressed understanding and agreed to proceed.   PATIENT: Leslie Reed DOB: 12-24-1945  REASON FOR VISIT: follow up HISTORY FROM: patient  HISTORY OF PRESENT ILLNESS: Today 11/14/23:  Leslie Reed is a 77 y.o. female with a history of OSA on CPAP. Returns today for follow-up.  She reports that the new CPAP is working well.  She thinks that she may be sleeping better.  Her download is below       REVIEW OF SYSTEMS: Out of a complete 14 system review of symptoms, the patient complains only of the following symptoms, and all other reviewed systems are negative.  ALLERGIES: No Known Allergies  HOME MEDICATIONS: Outpatient Medications Prior to Visit  Medication Sig Dispense Refill   fluticasone (FLONASE) 50 MCG/ACT nasal spray SHAKE LIQUID AND USE 2 SPRAYS IN EACH NOSTRIL DAILY 48 g 0   ipratropium (ATROVENT) 0.03 % nasal spray Place 1-2 sprays into both nostrils 2 (two) times daily as needed (nasal drainage). 30 mL 5   Minoxidil (ROGAINE EX) Apply topically.     Respiratory Therapy Supplies (FLUTTER) DEVI 1 each by Does not apply route 2 (two) times daily. (Patient taking differently: 1 each daily.) 1 each 0   Vitamin D, Ergocalciferol, (DRISDOL) 1.25 MG (50000 UNIT) CAPS capsule Take 1 capsule (50,000 Units total) by mouth every 14 (fourteen) days. 6 capsule 1   Facility-Administered Medications Prior to Visit  Medication  Dose Route Frequency Provider Last Rate Last Admin   0.9 %  sodium chloride infusion  500 mL Intravenous Continuous Nandigam, Eleonore Chiquito, MD        PAST MEDICAL HISTORY: Past Medical History:  Diagnosis Date   Allergic rhinitis    Allergy    Arthritis    hands, lower back   Asthma    Bronchitis    uses inhaler prn   Cataract    just being monitored - no surgery   Colitis 8/04   Diverticulitis    GERD (gastroesophageal reflux disease)    diet controlled, no meds   History of migraine headaches    Hx of colonic polyp    Hx of varicella    Hyperlipidemia    Insomnia    Interstitial cystitis 10/90   Mitral valve prolapse    Osteopenia    Scoliosis 2005   Sleep apnea    Tick bite 2014   STARI-antibiotics x 5 weeks   Tracheobronchopathia-osteochondroplastica     PAST SURGICAL HISTORY: Past Surgical History:  Procedure Laterality Date   ABDOMINAL HYSTERECTOMY     partial  for pain no cancer     ADENOIDECTOMY     APPENDECTOMY     BREAST BIOPSY Right    x 2    BRONCHIAL WASHINGS  04/22/2020   Procedure: BRONCHIAL lavage;  Surgeon: Steffanie Dunn, DO;  Location: [facility];  Service: Endoscopy;;   CESAREAN SECTION     x4   CHOLECYSTECTOMY  colon polyps     COLONOSCOPY  09/09/2016   Dr.Nandigam   DILATION AND CURETTAGE OF UTERUS     x 2   TONSILLECTOMY     VIDEO BRONCHOSCOPY N/A 04/22/2020   Procedure: VIDEO BRONCHOSCOPY WITHOUT FLUORO;  Surgeon: Steffanie Dunn, DO;  Location: [facility];  Service: Endoscopy;  Laterality: N/A;   WISDOM TOOTH EXTRACTION      FAMILY HISTORY: Family History  Problem Relation Age of Onset   Diabetes Mother    Pulmonary fibrosis Mother        57 deceased   Hypertension Mother    Hypertension Father    Alzheimer's disease Father    Kidney failure Father    Diabetes Sister    Hypertension Brother    Sleep apnea Brother    Diabetes Maternal Grandmother    Colon cancer Maternal Grandfather    Colon polyps Daughter     Thyroid disease Daughter    Psoriasis Daughter    Thyroid disease Daughter    Diabetes Son    Allergy (severe) Son    Depression Son    Esophageal cancer Neg Hx    Rectal cancer Neg Hx    Stomach cancer Neg Hx     SOCIAL HISTORY: Social History   Socioeconomic History   Marital status: Married    Spouse name: Not on file   Number of children: Not on file   Years of education: Not on file   Highest education level: Master's degree (e.g., MA, MS, MEng, MEd, MSW, MBA)  Occupational History   Not on file  Tobacco Use   Smoking status: Never    Passive exposure: Yes   Smokeless tobacco: Never  Vaping Use   Vaping status: Never Used  Substance and Sexual Activity   Alcohol use: Not Currently    Comment: occ   Drug use: No   Sexual activity: Not Currently    Partners: Male    Birth control/protection: Surgical, Post-menopausal    Comment: TAH  Other Topics Concern   Not on file  Social History Narrative   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   hhof 2   G6 p4    Pet cats    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    [address] 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   Neg td    Sleep 4-6 interrupted    Family is WISC father with alzheimers   Social Drivers of Health   Financial Resource Strain: Low Risk  (04/27/2022)   Overall Financial Resource Strain (CARDIA)    Difficulty of Paying Living Expenses: Not hard at all  Food Insecurity: No Food Insecurity (04/27/2022)   Hunger Vital Sign    Worried About Running Out of Food in the Last Year: Never true    Ran Out of Food in the Last Year: Never true  Transportation Needs: No Transportation Needs (04/27/2022)   PRAPARE - Administrator, Civil Service (Medical): No    Lack of Transportation (Non-Medical): No  Physical Activity: Insufficiently Active (04/27/2022)   Exercise Vital Sign    Days of Exercise per Week: 1 day    Minutes of Exercise per Session: 30 min  Stress: Stress Concern Present (04/27/2022)   [employer]  of Occupational Health - Occupational Stress Questionnaire    Feeling of Stress : To some extent  Social Connections: Moderately Isolated (04/27/2022)   Social Connection and Isolation Panel [NHANES]    Frequency of Communication with Friends and Family: More than three  times a week    Frequency of Social Gatherings with Friends and Family: More than three times a week    Attends Religious Services: Never    Database administrator or Organizations: No    Attends Engineer, structural: Not on file    Marital Status: Married  Catering manager Violence: Not on file      PHYSICAL EXAM Generalized: Well developed, in no acute distress   Neurological examination  Mentation: Alert oriented to time, place, history taking. Follows all commands speech and language fluent Cranial nerve II-XII:Extraocular movements were full. Facial symmetry noted.   DIAGNOSTIC DATA (LABS, IMAGING, TESTING) - I reviewed patient records, labs, notes, testing and imaging myself where available.  Lab Results  Component Value Date   WBC 4.9 02/22/2023   HGB 13.8 02/22/2023   HCT 41.5 02/22/2023   MCV 90.6 02/22/2023   PLT 326.0 02/22/2023      Component Value Date/Time   NA 141 02/22/2023 1051   NA 145 (H) 04/21/2022 1543   K 4.1 02/22/2023 1051   CL 105 02/22/2023 1051   CO2 25 02/22/2023 1051   GLUCOSE 86 02/22/2023 1051   BUN 18 02/22/2023 1051   BUN 13 04/21/2022 1543   CREATININE 0.82 02/22/2023 1051   CREATININE 0.66 07/01/2015 1415   CALCIUM 9.4 02/22/2023 1051   PROT 6.7 02/22/2023 1051   PROT 6.6 04/21/2022 1543   ALBUMIN 4.4 02/22/2023 1051   ALBUMIN 4.5 04/21/2022 1543   AST 18 02/22/2023 1051   ALT 21 02/22/2023 1051   ALKPHOS 75 02/22/2023 1051   BILITOT 0.5 02/22/2023 1051   BILITOT 0.4 04/21/2022 1543   GFRNONAA 89.85 02/24/2010 1118   Lab Results  Component Value Date   CHOL 202 (H) 02/22/2023   HDL 50.50 02/22/2023   LDLCALC 128 (H) 02/22/2023   LDLDIRECT 138.0  10/13/2011   TRIG 120.0 02/22/2023   CHOLHDL 4 02/22/2023   Lab Results  Component Value Date   HGBA1C 5.5 02/10/2021   Lab Results  Component Value Date   VITAMINB12 230 02/22/2023   Lab Results  Component Value Date   TSH 1.05 02/22/2023      ASSESSMENT AND PLAN 77 y.o. year old female  has a past medical history of Allergic rhinitis, Allergy, Arthritis, Asthma, Bronchitis, Cataract, Colitis (8/04), Diverticulitis, GERD (gastroesophageal reflux disease), History of migraine headaches, colonic polyp, varicella, Hyperlipidemia, Insomnia, Interstitial cystitis (10/90), Mitral valve prolapse, Osteopenia, Scoliosis (2005), Sleep apnea, Tick bite (2014), and Tracheobronchopathia-osteochondroplastica. here with:  OSA on CPAP  CPAP compliance excellent Residual AHI is good Encouraged patient to continue using CPAP nightly and > 4 hours each night F/U in 1 year or sooner if needed    Butch Penny, MSN, NP-C 11/14/2023, 3:17 PM [facility] [address] [place], [place] [address] [phone]

## 2024-01-11 ENCOUNTER — Ambulatory Visit (INDEPENDENT_AMBULATORY_CARE_PROVIDER_SITE_OTHER): Payer: Medicare Other | Admitting: Sports Medicine

## 2024-01-11 ENCOUNTER — Ambulatory Visit
Admission: RE | Admit: 2024-01-11 | Discharge: 2024-01-11 | Disposition: A | Discharge status: Home / Self Care | Payer: PRIVATE HEALTH INSURANCE | Source: Ambulatory Visit | Attending: Sports Medicine | Admitting: Sports Medicine

## 2024-01-11 VITALS — BP 132/72 | Ht 64.0 in | Wt 142.0 lb

## 2024-01-11 DIAGNOSIS — G8929 Other chronic pain: Secondary | ICD-10-CM | POA: Diagnosis not present

## 2024-01-11 DIAGNOSIS — M25561 Pain in right knee: Secondary | ICD-10-CM

## 2024-01-11 DIAGNOSIS — M25562 Pain in left knee: Secondary | ICD-10-CM | POA: Diagnosis not present

## 2024-01-11 MED ORDER — MELOXICAM 15 MG PO TABS: 15.0000 mg | ORAL_TABLET | Freq: Every day | ORAL | 0 refills | Status: DC

## 2024-01-11 NOTE — Patient Instructions (Signed)
Left knee pain I think this is secondary to arthritis in the knee with possible degenerative meniscus injury and a Baker's cyst behind the knee I have ordered standing x-rays for your knees so we can update and assess your degree of arthritis. I have also sent you in Meloxicam, take this once daily for a week then as needed for pain/swelling in the knee. Continue the compression sleeve.  Let's follow-up in 2 weeks to review your x-rays and check in on how you are doing.

## 2024-01-11 NOTE — Progress Notes (Signed)
   PCP: Leslie Headings, MD  SUBJECTIVE:   HPI:  Patient is a 78 y.o. female here with chief complaint of left knee pain.    She notes that over the past few years she has had intermittent pain in both of her knees.  However, this past Saturday she was doing yard work and had insidious onset of pain in the left knee.  He locates most of the pain along the medial joint line and the posterior aspect of the knee.  She states the pain is starting to get better over the past couple days that wanted to have it evaluated today.  She has been using a compression sleeve and topical voltaren gel which she finds to be helpful.  She is having some instability sensation while going downstairs though denies any catching or locking sensation in the knee.  She notes some mild swelling in the knee as well.  She has known history of osteoarthritis in both knees, however last x-rays available from 2020.  Pertinent ROS were reviewed with the patient and found to be negative unless otherwise specified above in HPI.   PERTINENT  PMH / PSH / FH / SH:  Past Medical, Surgical, Social, and Family History Reviewed & Updated in the EMR.  Pertinent findings include:  Osteopenia, Reflux, Hyperlipidemia, Hand OA  No Known Allergies  OBJECTIVE:  BP 132/72   Ht 5\' 4"  (1.626 m)   Wt 142 lb (64.4 kg)   LMP 11/29/1986   BMI 24.37 kg/m   PHYSICAL EXAM:  GEN: Alert and Oriented, NAD, comfortable in exam room RESP: Unlabored respirations, symmetric chest rise PSY: normal mood, congruent affect   Left Knee MSK EXAM: No gross deformity, ecchymoses, effusion. TTP along medial joint line and posterior knee. Mild swelling palpable in popliteal fossa. FROM with normal strength. Negative ant/post drawers. Negative valgus/varus testing. Negative lachman.  Positive mcmurrays and Thessaly. Positive pain w/ patellar grind. NV intact distally.  Review of most recent X-rays of bilateral knees (12/25/2018) on my impression show  mild medial joint space narrowing without significant degenerative changes and mild patellofemoral osteoarthritis. Assessment & Plan Chronic pain of both knees Presentation consistent with acute on chronic flare of left knee osteoarthritis. Do suspect a small Baker's cyst and degenerative meniscus disease as well. Right knee currently at her baseline but she does report some intermittent flaring of discomfort in this knee as well.  Plan: -2 weeks of Mobic 15mg  every day, then prn use after that -Encouraged continued use of knee compression sleeve -Order in place for updated standing x-rays for bilateral knees to evaluate for how advanced her osteoarthritis is -F/u in 2 weeks to check-in, review her x-rays, and discuss next steps. Consider U/S of posterior knee to evaluate for Baker's Cyst and she would be a candidate for cortisone injection if failing to respond to the above conservative management. Patient's questions were answered and they are in agreement with this plan.  Review of today's x-rays again show mild medial compartment narrowing and patellofemoral osteoarthritis bilaterally, left slightly worse radiographically than right. Similar to 2020 x-rays.   Leslie Salen, MD PGY-4, Sports Medicine Fellow [facility]

## 2024-01-11 NOTE — Assessment & Plan Note (Signed)
Presentation consistent with acute on chronic flare of left knee osteoarthritis. Do suspect a small Baker's cyst and degenerative meniscus disease as well. Right knee currently at her baseline but she does report some intermittent flaring of discomfort in this knee as well.  Plan: -2 weeks of Mobic 15mg  every day, then prn use after that -Encouraged continued use of knee compression sleeve -Order in place for updated standing x-rays for bilateral knees to evaluate for how advanced her osteoarthritis is -F/u in 2 weeks to check-in, review her x-rays, and discuss next steps. Consider U/S of posterior knee to evaluate for Baker's Cyst and she would be a candidate for cortisone injection if failing to respond to the above conservative management. Patient's questions were answered and they are in agreement with this plan.

## 2024-01-19 ENCOUNTER — Ambulatory Visit (INDEPENDENT_AMBULATORY_CARE_PROVIDER_SITE_OTHER): Payer: Medicare Other | Admitting: Family Medicine

## 2024-01-19 VITALS — Wt 142.0 lb

## 2024-01-19 DIAGNOSIS — Z0000 Encounter for general adult medical examination without abnormal findings: Secondary | ICD-10-CM

## 2024-01-19 NOTE — Patient Instructions (Signed)
I really enjoyed getting to talk with you today! I am available on Tuesdays and Thursdays for virtual visits if you have any questions or concerns, or if I can be of any further assistance.   CHECKLIST FROM ANNUAL WELLNESS VISIT:  -Follow up (please call to schedule if not scheduled after visit):   -yearly for annual wellness visit with primary care office  Here is a list of your preventive care/health maintenance measures and the plan for each if any are due:  PLAN For any measures below that may be due:   Health Maintenance  Topic Date Due   COVID-19 Vaccine (5 - 2024-25 season) 09/26/2023   Medicare Annual Wellness (AWV)  01/18/2025   DTaP/Tdap/Td (3 - Td or Tdap) 12/15/2033   Pneumonia Vaccine 1+ Years old  Completed   INFLUENZA VACCINE  Completed   DEXA SCAN  Completed   Hepatitis C Screening  Completed   Zoster Vaccines- Shingrix  Completed   HPV VACCINES  Aged Out   Colonoscopy  Discontinued    -See a dentist at least yearly  -Get your eyes checked and then per your eye specialist's recommendations  -Other issues addressed today:   -I have included below further information regarding a healthy whole foods based diet, physical activity guidelines for adults, stress management and opportunities for social connections. I hope you find this information useful.   -----------------------------------------------------------------------------------------------------------------------------------------------------------------------------------------------------------------------------------------------------------    NUTRITION: -eat real food: lots of colorful vegetables (half the plate) and fruits -5-7 servings of vegetables and fruits per day (fresh or steamed is best), exp. 2 servings of vegetables with lunch and dinner and 2 servings of fruit per day. Berries and greens such as kale and collards are great choices.  -consume on a regular basis:  fresh fruits, fresh  veggies, fish, nuts, seeds, healthy oils (such as olive oil, avocado oil), whole grains (make sure for bread/pasta/crackers/etc., that the first ingredient on label contains the word "whole"), legumes. -can eat small amounts of dairy and lean meat (no larger than the palm of your hand), but avoid processed meats such as ham, bacon, lunch meat, etc. -drink water -try to avoid fast food and pre-packaged foods, processed meat, ultra processed foods/beverages (donuts, candy, etc.) -most experts advise limiting sodium to < 2300mg  per day, should limit further is any chronic conditions such as high blood pressure, heart disease, diabetes, etc. The American Heart Association advised that < 1500mg  is is ideal -try to avoid foods/beverages that contain any ingredients with names you do not recognize  -try to avoid foods/beverages  with added sugar or sweeteners/sweets  -try to avoid sweet drinks (including diet drinks): soda, juice, Gatorade, sweet tea, power drinks, diet drinks -try to avoid white rice, white bread, pasta (unless whole grain)  EXERCISE GUIDELINES FOR ADULTS: -if you wish to increase your physical activity, do so gradually and with the approval of your doctor -STOP and seek medical care immediately if you have any chest pain, chest discomfort or trouble breathing when starting or increasing exercise  -move and stretch your body, legs, feet and arms when sitting for long periods -Physical activity guidelines for optimal health in adults: -get at least 150 minutes per week of moderate exercise (can talk, but not sing); this is about 20-30 minutes of sustained activity 5-7 days per week or two 10-15 minute episodes of sustained activity 5-7 days per week -do some muscle building/resistance training/strength training at least 2 days per week  -balance exercises 3+ days per week:  Stand somewhere where you have something sturdy to hold onto if you lose balance    1) lift up on toes, then back  down, start with 5x per day and work up to 20x   2) stand and lift one leg straight out to the side so that foot is a few inches of the floor, start with 5x each side and work up to 20x each side   3) stand on one foot, start with 5 seconds each side and work up to 20 seconds on each side  If you need ideas or help with getting more active:  -Silver sneakers https://tools.silversneakers.com  -Walk with a Doc: http://www.duncan-[email or web address]/  -try to include resistance (weight lifting/strength building) and balance exercises twice per week: or the following link for ideas: http://castillo-powell.com/  BuyDucts.dk  STRESS MANAGEMENT: -can try meditating, or just sitting quietly with deep breathing while intentionally relaxing all parts of your body for 5 minutes daily -if you need further help with stress, anxiety or depression please follow up with your primary doctor or contact the wonderful folks at [facility]: [phone]  SOCIAL CONNECTIONS: -options in [place] if you wish to engage in more social and exercise related activities:  -Silver sneakers https://tools.silversneakers.com  -Walk with a Doc: http://www.duncan-williams.com/  -Check out the [facility] Active Adults 50+ section on the [employer] (hiking clubs, book clubs, cards and games, chess, exercise classes, aquatic classes and much more) - see the website for details: https://www.[place].gov/departments/parks-recreation/active-adults50  -YouTube has lots of exercise videos for different ages and abilities as well  -[facility] (a variety of indoor and outdoor inperson activities for adults). [phone]. [address]  -Virtual Online Classes (a variety of topics): see seniorplanet.org or call [phone]  -consider volunteering at a school, hospice center, church, senior  center or elsewhere

## 2024-01-19 NOTE — Progress Notes (Signed)
PATIENT CHECK-IN and HEALTH RISK ASSESSMENT QUESTIONNAIRE:  -completed by phone/video for upcoming Medicare Preventive Visit   Pre-Visit Check-in: 1)Vitals (height, wt, BP, etc) - record in vitals section for visit on day of visit Request home vitals (wt, BP, etc.) and enter into vitals, THEN update Vital Signs SmartPhrase below at the top of the HPI. See below.  2)Review and Update Medications, Allergies PMH, Surgeries, Social history in Epic 3)Hospitalizations in the last year with date/reason?  no  4)Review and Update Care Team (patient's specialists) in Epic 5) Complete PHQ9 in Epic  6) Complete Fall Screening in Epic 7)Review all Health Maintenance Due and order under PCP if not done.  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 Some in the left ear, but mild Do you feel that you have a problem with memory? Some issues, but things usually come to her, has seen neurology in the past, might follow up but was told was fine Do you feel safe at home?y Last dentist visit? Goes on a regular basis 8. Do you have any difficulty performing your everyday activities?n Are you having any difficulty walking, taking medications on your own, and or difficulty managing daily home needs?n Do you have difficulty walking or climbing stairs?n Do you have difficulty dressing or bathing?n Do you have  difficulty doing errands alone such as visiting a doctor's office or shopping?n Do you currently have any difficulty preparing food and eating?n Do you currently have any difficulty using the toilet?n Do you have any difficulty managing your finances?n Do you have any difficulties with housekeeping of managing your housekeeping?n   Do you have Advanced Directives in place (Living Will, Healthcare Power or Attorney)? yes   Last eye Exam and location? Gets regular eye exams per her report   Do you currently use prescribed or non-prescribed narcotic or opioid pain medications?n     ----------------------------------------------------------------------------------------------------------------------------------------------------------------------------------------------------------------------  Because this visit was a virtual/telehealth visit, some criteria may be missing or patient reported. Any vitals not documented were not able to be obtained and vitals that have been documented are patient reported.    MEDICARE ANNUAL PREVENTIVE VISIT WITH PROVIDER: (Welcome to Medicare, initial annual wellness or annual wellness exam)  Virtual Visit via Video Note  I connected with Pricilla Holm on 01/19/24 by  a video enabled telemedicine application and verified that I am speaking with the correct person using two identifiers.  Location patient: home Location provider:work or home office Persons participating in the virtual visit: patient, provider  Concerns and/or follow up today: reports doing ok, but insurance took her off her immun-def infusion. She has been ok though with no major infections. Sees orthopedic specialist for arthritis in the knees. Using some meloxicam for that and has follow up next week. Is improving.    See HM section in Epic for other details of completed HM.    ROS: negative for report of fevers, unintentional weight loss, vision changes, vision loss, hearing  loss or change, chest pain, sob, hemoptysis, melena, hematochezia, hematuria, falls, bleeding or bruising, thoughts of suicide or self harm, memory loss  Patient-completed extensive health risk assessment - reviewed and discussed with the patient: See Health Risk Assessment completed with patient prior to the visit either above or in recent phone note. This was reviewed in detailed with the patient today and appropriate recommendations, orders and referrals were placed as needed per Summary below and patient instructions.   Review of Medical History: -PMH, PSH, Family History and current specialty and care providers reviewed and updated and listed below   Patient Care Team: Panosh, Neta Mends, MD as PCP - General (Internal Medicine) Enid Baas, MD (Family Medicine) Jerene Bears, MD (Gynecology) Dohmeier, Porfirio Mylar, MD (Neurology) Ellamae Sia, DO as Consulting Physician (Allergy)   Past Medical History:  Diagnosis Date   Allergic rhinitis    Allergy    Arthritis    hands, lower back   Asthma    Bronchitis    uses inhaler prn   Cataract    just being monitored - no surgery   Colitis 8/04   Diverticulitis    GERD (gastroesophageal reflux disease)    diet controlled, no meds   History of migraine headaches    Hx of colonic polyp    Hx of varicella    Hyperlipidemia    Insomnia    Interstitial cystitis 10/90   Mitral valve prolapse    Osteopenia    Scoliosis 2005   Sleep apnea    Tick bite 2014   STARI-antibiotics x 5 weeks   Tracheobronchopathia-osteochondroplastica     Past Surgical History:  Procedure Laterality Date   ABDOMINAL HYSTERECTOMY     partial  for pain no cancer     ADENOIDECTOMY     APPENDECTOMY     BREAST BIOPSY Right    x 2    BRONCHIAL WASHINGS  04/22/2020   Procedure: BRONCHIAL lavage;  Surgeon: Steffanie Dunn, DO;  Location: [facility];  Service: Endoscopy;;   CESAREAN SECTION     x4   CHOLECYSTECTOMY     colon polyps     COLONOSCOPY   09/09/2016   Dr.Nandigam   DILATION AND CURETTAGE OF UTERUS     x 2   TONSILLECTOMY     VIDEO BRONCHOSCOPY N/A 04/22/2020   Procedure: VIDEO BRONCHOSCOPY WITHOUT FLUORO;  Surgeon: Steffanie Dunn, DO;  Location: [facility];  Service: Endoscopy;  Laterality: N/A;   WISDOM TOOTH EXTRACTION      Social History   Socioeconomic History   Marital status: Married    Spouse name: Not on file   Number of children: Not on file   Years of education: Not on file   Highest education level: Master's degree (e.g., MA, MS, MEng, MEd, MSW, MBA)  Occupational History   Not on file  Tobacco Use   Smoking status: Never    Passive exposure: Yes   Smokeless tobacco: Never  Vaping Use   Vaping status: Never Used  Substance and Sexual Activity   Alcohol use: Not Currently    Comment: occ   Drug use: No   Sexual activity: Not Currently    Partners: Male    Birth control/protection: Surgical, Post-menopausal    Comment: TAH  Other Topics Concern   Not on file  Social History Narrative   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   hhof 2   G6 p4    Pet cats    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    [address] 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   Neg td    Sleep 4-6 interrupted    Family is WISC father with alzheimers   Social Drivers of Health   Financial Resource Strain: Low Risk  (04/27/2022)   Overall Financial Resource Strain (CARDIA)    Difficulty of Paying Living Expenses: Not hard at all  Food Insecurity: No Food Insecurity (04/27/2022)   Hunger Vital Sign    Worried About Running Out of Food in the Last Year: Never true    Ran Out of Food in the Last Year: Never true  Transportation Needs: No Transportation Needs (04/27/2022)   PRAPARE - Administrator, Civil Service (Medical): No    Lack of Transportation (Non-Medical): No  Physical Activity: Insufficiently Active (04/27/2022)   Exercise Vital Sign    Days of Exercise per Week: 1 day    Minutes of Exercise per Session: 30 min   Stress: Stress Concern Present (04/27/2022)   [employer] of Occupational Health - Occupational Stress Questionnaire    Feeling of Stress : To some extent  Social Connections: Moderately Isolated (04/27/2022)   Social Connection and Isolation Panel [NHANES]    Frequency of Communication with Friends and Family: More than three times a week    Frequency of Social Gatherings with Friends and Family: More than three times a week    Attends Religious Services: Never    Database administrator or Organizations: No    Attends Engineer, structural: Not on file    Marital Status: Married  Catering manager Violence: Not on file    Family History  Problem Relation Age of Onset   Diabetes Mother    Pulmonary fibrosis Mother        33 deceased   Hypertension Mother    Hypertension Father    Alzheimer's disease Father    Kidney failure Father    Diabetes Sister    Hypertension Brother    Sleep apnea Brother    Diabetes Maternal Grandmother    Colon cancer Maternal Grandfather    Colon polyps Daughter    Thyroid disease Daughter    Psoriasis Daughter    Thyroid disease Daughter    Diabetes Son    Allergy (severe) Son    Depression Son    Esophageal cancer Neg Hx    Rectal cancer Neg Hx    Stomach cancer Neg Hx     Current Outpatient Medications on File Prior to Visit  Medication Sig Dispense Refill   fluticasone (FLONASE) 50 MCG/ACT nasal spray SHAKE LIQUID AND USE 2 SPRAYS IN EACH NOSTRIL DAILY 48 g 0   ipratropium (ATROVENT) 0.03 % nasal spray Place 1-2 sprays into both nostrils 2 (two) times daily as needed (nasal drainage). 30 mL 5   meloxicam (MOBIC) 15 MG tablet Take 1 tablet (15 mg total) by mouth daily. Take for 1 week, then as needed. 30 tablet 0   Minoxidil (ROGAINE EX) Apply topically.     Respiratory Therapy Supplies (FLUTTER) DEVI 1 each by Does not apply route 2 (two) times daily. (Patient taking differently: 1 each daily.) 1 each 0   Vitamin D,  Ergocalciferol, (DRISDOL) 1.25 MG (50000 UNIT) CAPS capsule Take 1 capsule (50,000 Units total) by mouth every 14 (fourteen) days. 6 capsule 1   Current Facility-Administered Medications on File Prior to Visit  Medication Dose Route Frequency Provider Last Rate Last Admin   0.9 %  sodium chloride infusion  500 mL Intravenous Continuous Nandigam, Eleonore Chiquito, MD        No Known Allergies     Physical Exam Vitals requested from patient and listed below if patient had equipment and was able to obtain at home for this virtual visit: There were no vitals filed for this visit. Estimated body mass index is 24.37 kg/m as calculated from the following:   Height as of 01/11/24: 5\' 4"  (1.626 m).   Weight as of this encounter: 142 lb (64.4 kg).  EKG (optional): deferred due to virtual visit  GENERAL: alert, oriented, no acute distress detected, full vision exam deferred due to pandemic and/or virtual encounter   HEENT: atraumatic, conjunttiva clear, no obvious abnormalities on inspection of external nose and ears  NECK: normal movements of the head and neck  LUNGS: on inspection no signs of respiratory distress, breathing rate appears normal, no obvious gross SOB, gasping or wheezing  CV: no obvious cyanosis  MS: moves all visible extremities without noticeable abnormality  PSYCH/NEURO: pleasant and cooperative, no obvious depression or anxiety, speech and thought processing grossly intact, Cognitive function grossly intact  Flowsheet Row Office Visit from [facility]/22/2023 in [facility]  PHQ-9 Total Score 2           01/19/2024    6:22 PM 08/09/2023    1:16 PM 02/22/2023   10:12 AM 11/11/2022    9:36 AM 06/28/2022    9:14 AM  Depression screen PHQ 2/9  Decreased Interest 0 0 0 0 0  Down, Depressed, Hopeless 0 0 0 0 0  PHQ - 2 Score 0 0 0 0 0  Altered sleeping   1 1   Tired, decreased energy   1 1   Change in appetite   0 0   Feeling bad or failure about  yourself    0 0   Trouble concentrating   0 0   Moving slowly or fidgety/restless   0 0   Suicidal thoughts   0 0   PHQ-9 Score   2 2   Difficult doing work/chores    Not difficult at all        04/27/2022    2:06 PM 11/11/2022    9:36 AM 02/22/2023   10:12 AM 08/09/2023    1:16 PM 01/19/2024    6:22 PM  Fall Risk  Falls in the past year? 1 0 1 0 0  Was there an injury with Fall? 0 0 0 0 0  Was there an injury with Fall? - Comments scrapes and bruises      Fall Risk Category Calculator 1 0 1 0 0  Fall Risk Category (Retired) Low Low     (RETIRED) Patient Fall Risk Level  Low fall risk     Patient at Risk for Falls Due to  No Fall Risks Other (Comment) No Fall Risks   Fall risk Follow up  Falls evaluation completed Falls evaluation completed Falls evaluation completed Falls evaluation completed     SUMMARY AND PLAN:  Encounter for annual wellness exam in Medicare patient   Discussed applicable health maintenance/preventive health measures and advised and referred or ordered per patient preferences:  Health Maintenance  Topic Date Due   COVID-19 Vaccine (5 - 2024-25 season) 09/26/2023   Medicare Annual Wellness (AWV)  01/18/2025   DTaP/Tdap/Td (3 - Td or Tdap) 12/15/2033   Pneumonia Vaccine 41+ Years old  Completed   INFLUENZA VACCINE  Completed   DEXA SCAN  Completed   Hepatitis C Screening  Completed   Zoster Vaccines- Shingrix  Completed   HPV VACCINES  Aged Out   Colonoscopy  Discontinued      Education and counseling on the following was provided based on the above review of health and a plan/checklist for the patient, along with additional information discussed, was provided for the patient in the patient instructions :  -Provided counseling and plan for increased risk of falling if applicable per above screening. Reviewed and discussed safe balance exercises that can be done at home to improve balance and discussed exercise guidelines for adults with include  balance exercises at least 3 days per week.  -Advised and counseled on a healthy lifestyle - including the importance of a healthy diet, regular physical activity, social connections and stress management. -Reviewed patient's current diet. Advised and counseled on a whole foods based healthy diet. A summary of a healthy diet was provided in the Patient Instructions. Encouraged to try to reduce foods/beverages with added sugar/sweeteners and to replace ultraprocessed grains with whole grains. -reviewed patient's current physical activity level and discussed exercise guidelines for adults. Discussed community resources and ideas for safe exercise at home to assist in meeting exercise guideline recommendations in a safe and healthy way.  -Advise yearly dental visits at minimum and regular eye exams   Follow up: see patient instructions     Patient Instructions  I really enjoyed getting to talk with you today! I am available on Tuesdays and Thursdays for virtual visits if you have any questions or concerns, or if I can be of any further assistance.   CHECKLIST FROM ANNUAL WELLNESS VISIT:  -Follow up (please call to schedule if not scheduled after visit):   -yearly for annual wellness visit with primary care office  Here is a list of your preventive care/health maintenance measures and the plan for each if any are due:  PLAN For any measures below that may be due:   Health Maintenance  Topic Date Due   COVID-19 Vaccine (5 - 2024-25 season) 09/26/2023   Medicare Annual Wellness (AWV)  01/18/2025   DTaP/Tdap/Td (3 - Td or Tdap) 12/15/2033   Pneumonia Vaccine 73+ Years old  Completed   INFLUENZA VACCINE  Completed   DEXA SCAN  Completed   Hepatitis C Screening  Completed   Zoster Vaccines- Shingrix  Completed   HPV VACCINES  Aged Out   Colonoscopy  Discontinued    -See a dentist at least yearly  -Get your eyes checked and then per your eye specialist's recommendations  -Other issues  addressed today:   -I have included below further information regarding a healthy whole foods based diet, physical activity guidelines for adults, stress management and opportunities for social connections. I hope you find this information useful.   -----------------------------------------------------------------------------------------------------------------------------------------------------------------------------------------------------------------------------------------------------------    NUTRITION: -eat real food: lots of colorful vegetables (half the plate) and fruits -5-7 servings of vegetables and fruits per day (fresh or steamed is best), exp. 2 servings of vegetables with lunch and dinner and 2 servings of fruit per day. Berries and greens such as kale and collards are great choices.  -consume on a regular basis:  fresh fruits, fresh veggies, fish, nuts, seeds, healthy oils (such as olive oil, avocado oil), whole grains (make sure for bread/pasta/crackers/etc., that the first ingredient on label contains the word "whole"), legumes. -can eat small amounts of dairy and lean meat (no larger than the palm of your hand), but avoid processed meats such as  ham, bacon, lunch meat, etc. -drink water -try to avoid fast food and pre-packaged foods, processed meat, ultra processed foods/beverages (donuts, candy, etc.) -most experts advise limiting sodium to < 2300mg  per day, should limit further is any chronic conditions such as high blood pressure, heart disease, diabetes, etc. The American Heart Association advised that < 1500mg  is is ideal -try to avoid foods/beverages that contain any ingredients with names you do not recognize  -try to avoid foods/beverages  with added sugar or sweeteners/sweets  -try to avoid sweet drinks (including diet drinks): soda, juice, Gatorade, sweet tea, power drinks, diet drinks -try to avoid white rice, white bread, pasta (unless whole grain)  EXERCISE  GUIDELINES FOR ADULTS: -if you wish to increase your physical activity, do so gradually and with the approval of your doctor -STOP and seek medical care immediately if you have any chest pain, chest discomfort or trouble breathing when starting or increasing exercise  -move and stretch your body, legs, feet and arms when sitting for long periods -Physical activity guidelines for optimal health in adults: -get at least 150 minutes per week of moderate exercise (can talk, but not sing); this is about 20-30 minutes of sustained activity 5-7 days per week or two 10-15 minute episodes of sustained activity 5-7 days per week -do some muscle building/resistance training/strength training at least 2 days per week  -balance exercises 3+ days per week:   Stand somewhere where you have something sturdy to hold onto if you lose balance    1) lift up on toes, then back down, start with 5x per day and work up to 20x   2) stand and lift one leg straight out to the side so that foot is a few inches of the floor, start with 5x each side and work up to 20x each side   3) stand on one foot, start with 5 seconds each side and work up to 20 seconds on each side  If you need ideas or help with getting more active:  -Silver sneakers https://tools.silversneakers.com  -Walk with a Doc: http://www.duncan-williams.com/  -try to include resistance (weight lifting/strength building) and balance exercises twice per week: or the following link for ideas: http://castillo-powell.com/  BuyDucts.dk  STRESS MANAGEMENT: -can try meditating, or just sitting quietly with deep breathing while intentionally relaxing all parts of your body for 5 minutes daily -if you need further help with stress, anxiety or depression please follow up with your primary doctor or contact the wonderful folks at [facility]: [phone]  SOCIAL  CONNECTIONS: -options in [place] if you wish to engage in more social and exercise related activities:  -Silver sneakers https://tools.silversneakers.com  -Walk with a Doc: http://www.duncan-williams.com/  -Check out the [facility] Active Adults 50+ section on the [place] [employer] (hiking clubs, book clubs, cards and games, chess, exercise classes, aquatic classes and much more) - see the website for details: https://www.[place].gov/departments/parks-recreation/active-adults50  -YouTube has lots of exercise videos for different ages and abilities as well  -[facility] (a variety of indoor and outdoor inperson activities for adults). 8658754795. [address].  -Virtual Online Classes (a variety of topics): see seniorplanet.org or call (530)615-5755  -consider volunteering at a school, hospice center, church, senior center or elsewhere            Terressa Koyanagi, DO

## 2024-01-26 ENCOUNTER — Other Ambulatory Visit: Payer: Self-pay

## 2024-01-26 ENCOUNTER — Ambulatory Visit (INDEPENDENT_AMBULATORY_CARE_PROVIDER_SITE_OTHER): Payer: Medicare Other | Admitting: Sports Medicine

## 2024-01-26 VITALS — BP 132/74 | Ht 64.0 in | Wt 142.0 lb

## 2024-01-26 DIAGNOSIS — M19042 Primary osteoarthritis, left hand: Secondary | ICD-10-CM

## 2024-01-26 DIAGNOSIS — G8929 Other chronic pain: Secondary | ICD-10-CM

## 2024-01-26 DIAGNOSIS — M19041 Primary osteoarthritis, right hand: Secondary | ICD-10-CM | POA: Diagnosis not present

## 2024-01-26 DIAGNOSIS — M25562 Pain in left knee: Secondary | ICD-10-CM

## 2024-01-26 NOTE — Progress Notes (Addendum)
   PCP: [name], MD  SUBJECTIVE:   HPI:  Patient is a 78 y.o. female here for follow-up on left knee pain. Over the past two weeks she has been using the compression sleeve and Mobic with much improvement. 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 She also notes aching of her fingers in both hands with her work and some perceived weakness with gripping things. No radiating pain, numbness or tingling. This has been ongoing for years. She hasn't tried any therapies for this though does have Voltaren at home. No other complaints.  Pertinent ROS were reviewed with the patient and found to be negative unless otherwise specified above in HPI.   PERTINENT  PMH / PSH / FH / SH:  Past Medical, Surgical, Social, and Family History Reviewed & Updated in the EMR.  Pertinent findings include:  Osteopenia, Reflux, Hyperlipidemia, Hand OA   No Known Allergies  OBJECTIVE:  BP 132/74   Ht 5\' 4"  (1.626 m)   Wt 142 lb (64.4 kg)   LMP 11/29/1986   BMI 24.37 kg/m   PHYSICAL EXAM:  GEN: Alert and Oriented, NAD, comfortable in exam room RESP: Unlabored respirations, symmetric chest rise PSY: normal mood, congruent affect   Left Knee MSK EXAM: No gross deformity, ecchymoses, effusion. TTP along medial joint line, though no other tenderness noted.  FROM with normal strength. NV intact distally.  Bilateral Hand MSK Exam: Heberdens and Bouchard's nodes with ulnar deformity noted of DIP and PIPs of digits 2-3 bilaterally. No other deformity noted and no swelling noted. ROM is full and Strength is full. No reproducible TTP.    Limited MSK Ultrasound of the Left Knee Small effusion in the suprapatellar pouch Distal quadriceps tendon with some hyperechoic calcifications at the attachment to the patella. Medial meniscus visualized with some degenerative tearing noted and hyperechoic  calcifications, joint line is mildly narrowed without significant spurring  Popliteal fossa without evidence of Baker's Cyst. Vessels visualized and patent.   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 U/S performed and interpreted by Glean Salen, MD.  Review updated x-rays of bilateral knees 12/2023 shows mild medial joint space narrowing without significant degenerative changes and mild patellofemoral osteoarthritis. Both of these are similar to her knee x-rays from 11/2018 without much interval changes. Assessment & Plan Chronic pain of left knee Acute on chronic flare of left knee OA with mild degenerative medial meniscus. Improving with Mobic, activity modification, and compression sleeve. Recommended continuation of these and transition to PRN Mobic use. Did discuss possibility of intra-articular cortisone injection in the future if pain flares again. F/u PRN. Patient's questions were answered and they are in agreement with this plan.  Primary osteoarthritis of both hands Primarily affecting digits 2-3 at PIPs and DIPs bilaterally. Reviewed supportive care measures in the way of topical diclofenac use.   Glean Salen, MD PGY-4, Sports Medicine Fellow [facility]  I observed and examined the patient with the [facility] resident and agree with assessment and plan.  Note reviewed and modified by me. Sterling Big, MD

## 2024-01-29 ENCOUNTER — Other Ambulatory Visit: Payer: Self-pay | Admitting: Sports Medicine

## 2024-02-06 NOTE — Progress Notes (Unsigned)
 No chief complaint on file.   HPI: Patient  Leslie Reed  78 y.o. comes in today for Preventive Health Care visit  Specialty care  Hyporglammaglobulin and  bronchiescteisi  Health Maintenance  Topic Date Due   COVID-19 Vaccine (5 - 2024-25 season) 09/26/2023   Medicare Annual Wellness (AWV)  01/18/2025   DTaP/Tdap/Td (3 - Td or Tdap) 12/15/2033   Pneumonia Vaccine 59+ Years old  Completed   INFLUENZA VACCINE  Completed   DEXA SCAN  Completed   Hepatitis C Screening  Completed   Zoster Vaccines- Shingrix  Completed   HPV VACCINES  Aged Out   Colonoscopy  Discontinued   Health Maintenance Review LIFESTYLE:  Exercise:   Tobacco/ETS: Alcohol:  Sugar beverages: Sleep: Drug use: no HH of  Work:    ROS:  GEN/ HEENT: No fever, significant weight changes sweats headaches vision problems hearing changes, CV/ PULM; No chest pain shortness of breath cough, syncope,edema  change in exercise tolerance. GI /GU: No adominal pain, vomiting, change in bowel habits. No blood in the stool. No significant GU symptoms. SKIN/HEME: ,no acute skin rashes suspicious lesions or bleeding. No lymphadenopathy, nodules, masses.  NEURO/ PSYCH:  No neurologic signs such as weakness numbness. No depression anxiety. IMM/ Allergy: No unusual infections.  Allergy .   REST of 12 system review negative except as per HPI   Past Medical History:  Diagnosis Date   Allergic rhinitis    Allergy    Arthritis    hands, lower back   Asthma    Bronchitis    uses inhaler prn   Cataract    just being monitored - no surgery   Colitis 8/04   Diverticulitis    GERD (gastroesophageal reflux disease)    diet controlled, no meds   History of migraine headaches    Hx of colonic polyp    Hx of varicella    Hyperlipidemia    Insomnia    Interstitial cystitis 10/90   Mitral valve prolapse    Osteopenia    Scoliosis 2005   Sleep apnea    Tick bite 2014   STARI-antibiotics x 5 weeks    Tracheobronchopathia-osteochondroplastica     Past Surgical History:  Procedure Laterality Date   ABDOMINAL HYSTERECTOMY     partial  for pain no cancer     ADENOIDECTOMY     APPENDECTOMY     BREAST BIOPSY Right    x 2    BRONCHIAL WASHINGS  04/22/2020   Procedure: BRONCHIAL lavage;  Surgeon: Steffanie Dunn, DO;  Location: [facility];  Service: Endoscopy;;   CESAREAN SECTION     x4   CHOLECYSTECTOMY     colon polyps     COLONOSCOPY  09/09/2016   Dr.Nandigam   DILATION AND CURETTAGE OF UTERUS     x 2   TONSILLECTOMY     VIDEO BRONCHOSCOPY N/A 04/22/2020   Procedure: VIDEO BRONCHOSCOPY WITHOUT FLUORO;  Surgeon: Steffanie Dunn, DO;  Location: [facility];  Service: Endoscopy;  Laterality: N/A;   WISDOM TOOTH EXTRACTION      Family History  Problem Relation Age of Onset   Diabetes Mother    Pulmonary fibrosis Mother        61 deceased   Hypertension Mother    Hypertension Father    Alzheimer's disease Father    Kidney failure Father    Diabetes Sister    Hypertension Brother    Sleep apnea Brother    Diabetes Maternal  Grandmother    Colon cancer Maternal Grandfather    Colon polyps Daughter    Thyroid disease Daughter    Psoriasis Daughter    Thyroid disease Daughter    Diabetes Son    Allergy (severe) Son    Depression Son    Esophageal cancer Neg Hx    Rectal cancer Neg Hx    Stomach cancer Neg Hx     Social History   Socioeconomic History   Marital status: Married    Spouse name: Not on file   Number of children: Not on file   Years of education: Not on file   Highest education level: Master's degree (e.g., MA, MS, MEng, MEd, MSW, MBA)  Occupational History   Not on file  Tobacco Use   Smoking status: Never    Passive exposure: Yes   Smokeless tobacco: Never  Vaping Use   Vaping status: Never Used  Substance and Sexual Activity   Alcohol use: Not Currently    Comment: occ   Drug use: No   Sexual activity: Not Currently    Partners: Male     Birth control/protection: Surgical, Post-menopausal    Comment: TAH  Other Topics Concern   Not on file  Social History Narrative   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   hhof 2   G6 p4    Pet cats    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    [address] 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   Neg td    Sleep 4-6 interrupted    Family is WISC father with alzheimers   Social Drivers of Health   Financial Resource Strain: Low Risk  (04/27/2022)   Overall Financial Resource Strain (CARDIA)    Difficulty of Paying Living Expenses: Not hard at all  Food Insecurity: No Food Insecurity (04/27/2022)   Hunger Vital Sign    Worried About Running Out of Food in the Last Year: Never true    Ran Out of Food in the Last Year: Never true  Transportation Needs: No Transportation Needs (04/27/2022)   PRAPARE - Administrator, Civil Service (Medical): No    Lack of Transportation (Non-Medical): No  Physical Activity: Insufficiently Active (04/27/2022)   Exercise Vital Sign    Days of Exercise per Week: 1 day    Minutes of Exercise per Session: 30 min  Stress: Stress Concern Present (04/27/2022)   [employer] of Occupational Health - Occupational Stress Questionnaire    Feeling of Stress : To some extent  Social Connections: Moderately Isolated (04/27/2022)   Social Connection and Isolation Panel [NHANES]    Frequency of Communication with Friends and Family: More than three times a week    Frequency of Social Gatherings with Friends and Family: More than three times a week    Attends Religious Services: Never    Database administrator or Organizations: No    Attends Engineer, structural: Not on file    Marital Status: Married    Outpatient Medications Prior to Visit  Medication Sig Dispense Refill   fluticasone (FLONASE) 50 MCG/ACT nasal spray SHAKE LIQUID AND USE 2 SPRAYS IN EACH NOSTRIL DAILY 48 g 0   ipratropium (ATROVENT) 0.03 % nasal spray Place 1-2 sprays into both nostrils 2  (two) times daily as needed (nasal drainage). 30 mL 5   meloxicam (MOBIC) 15 MG tablet Take 1 tablet (15 mg total) by mouth daily. Take for 1 week, then as needed. 30 tablet 0   Minoxidil (  ROGAINE EX) Apply topically.     Respiratory Therapy Supplies (FLUTTER) DEVI 1 each by Does not apply route 2 (two) times daily. (Patient taking differently: 1 each daily.) 1 each 0   Vitamin D, Ergocalciferol, (DRISDOL) 1.25 MG (50000 UNIT) CAPS capsule Take 1 capsule (50,000 Units total) by mouth every 14 (fourteen) days. 6 capsule 1   Facility-Administered Medications Prior to Visit  Medication Dose Route Frequency Provider Last Rate Last Admin   0.9 %  sodium chloride infusion  500 mL Intravenous Continuous Nandigam, Kavitha V, MD         EXAM:  LMP 11/29/1986   There is no height or weight on file to calculate BMI. Wt Readings from Last 3 Encounters:  01/26/24 142 lb (64.4 kg)  01/19/24 142 lb (64.4 kg)  01/11/24 142 lb (64.4 kg)    Physical Exam: Vital signs reviewed [id number] is a well-developed well-nourished alert cooperative    who appearsr stated age in no acute distress.  HEENT: normocephalic atraumatic , Eyes: PERRL EOM's full, conjunctiva clear, Nares: paten,t no deformity discharge or tenderness., Ears: no deformity EAC's clear TMs with normal landmarks. Mouth: clear OP, no lesions, edema.  Moist mucous membranes. Dentition in adequate repair. NECK: supple without masses, thyromegaly or bruits. CHEST/PULM:  Clear to auscultation and percussion breath sounds equal no wheeze , rales or rhonchi. No chest wall deformities or tenderness. Breast: normal by inspection . No dimpling, discharge, masses, tenderness or discharge . CV: PMI is nondisplaced, S1 S2 no gallops, murmurs, rubs. Peripheral pulses are full without delay.No JVD .  ABDOMEN: Bowel sounds normal nontender  No guard or rebound, no hepato splenomegal no CVA tenderness.  No hernia. Extremtities:  No clubbing cyanosis or edema,  no acute joint swelling or redness no focal atrophy NEURO:  Oriented x3, cranial nerves 3-12 appear to be intact, no obvious focal weakness,gait within normal limits no abnormal reflexes or asymmetrical SKIN: No acute rashes normal turgor, color, no bruising or petechiae. PSYCH: Oriented, good eye contact, no obvious depression anxiety, cognition and judgment appear normal. LN: no cervical axillary adenopathy  Lab Results  Component Value Date   WBC 4.9 02/22/2023   HGB 13.8 02/22/2023   HCT 41.5 02/22/2023   PLT 326.0 02/22/2023   GLUCOSE 86 02/22/2023   CHOL 202 (H) 02/22/2023   TRIG 120.0 02/22/2023   HDL 50.50 02/22/2023   LDLDIRECT 138.0 10/13/2011   LDLCALC 128 (H) 02/22/2023   ALT 21 02/22/2023   AST 18 02/22/2023   NA 141 02/22/2023   K 4.1 02/22/2023   CL 105 02/22/2023   CREATININE 0.82 02/22/2023   BUN 18 02/22/2023   CO2 25 02/22/2023   TSH 1.05 02/22/2023   HGBA1C 5.5 02/10/2021    BP Readings from Last 3 Encounters:  01/26/24 132/74  01/11/24 132/72  09/19/23 (!) 140/80    Lab plan reviewed with patient   ASSESSMENT AND PLAN:  Discussed the following assessment and plan:  No diagnosis found. No follow-ups on file.  Patient Care Team: Brogen Duell, Neta Mends, MD as PCP - General (Internal Medicine) Enid Baas, MD (Family Medicine) Jerene Bears, MD (Gynecology) Dohmeier, Porfirio Mylar, MD (Neurology) Ellamae Sia, DO as Consulting Physician (Allergy) There are no Patient Instructions on file for this visit.  Neta Mends. Yuleidy Rappleye M.D.

## 2024-02-07 ENCOUNTER — Ambulatory Visit (INDEPENDENT_AMBULATORY_CARE_PROVIDER_SITE_OTHER): Payer: PRIVATE HEALTH INSURANCE | Admitting: Internal Medicine

## 2024-02-07 ENCOUNTER — Encounter: Payer: Self-pay | Admitting: Internal Medicine

## 2024-02-07 VITALS — BP 168/86 | HR 68 | Temp 98.3°F | Ht 63.9 in | Wt 143.4 lb

## 2024-02-07 DIAGNOSIS — D801 Nonfamilial hypogammaglobulinemia: Secondary | ICD-10-CM | POA: Diagnosis not present

## 2024-02-07 DIAGNOSIS — E785 Hyperlipidemia, unspecified: Secondary | ICD-10-CM

## 2024-02-07 DIAGNOSIS — E559 Vitamin D deficiency, unspecified: Secondary | ICD-10-CM | POA: Diagnosis not present

## 2024-02-07 DIAGNOSIS — Z79899 Other long term (current) drug therapy: Secondary | ICD-10-CM

## 2024-02-07 DIAGNOSIS — R030 Elevated blood-pressure reading, without diagnosis of hypertension: Secondary | ICD-10-CM

## 2024-02-07 DIAGNOSIS — R4189 Other symptoms and signs involving cognitive functions and awareness: Secondary | ICD-10-CM

## 2024-02-07 DIAGNOSIS — R413 Other amnesia: Secondary | ICD-10-CM | POA: Diagnosis not present

## 2024-02-07 DIAGNOSIS — Z0000 Encounter for general adult medical examination without abnormal findings: Secondary | ICD-10-CM | POA: Diagnosis not present

## 2024-02-07 DIAGNOSIS — J479 Bronchiectasis, uncomplicated: Secondary | ICD-10-CM

## 2024-02-07 LAB — LIPID PANEL
Cholesterol: 200 mg/dL (ref 0–200)
HDL: 64 mg/dL (ref >39.00)
LDL Cholesterol: 119 mg/dL — ABNORMAL HIGH (ref 0–99)
NonHDL: 135.97
Total CHOL/HDL Ratio: 3
Triglycerides: 85 mg/dL (ref 0.0–149.0)
VLDL: 17 mg/dL (ref 0.0–40.0)

## 2024-02-07 LAB — VITAMIN B12: Vitamin B-12: 215 pg/mL (ref 211–911)

## 2024-02-07 LAB — CBC WITH DIFFERENTIAL/PLATELET
Basophils Absolute: 0 10*3/uL (ref 0.0–0.1)
Basophils Relative: 0.5 % (ref 0.0–3.0)
Eosinophils Absolute: 0.1 10*3/uL (ref 0.0–0.7)
Eosinophils Relative: 1.1 % (ref 0.0–5.0)
HCT: 43.4 % (ref 36.0–46.0)
Hemoglobin: 14.4 g/dL (ref 12.0–15.0)
Lymphocytes Relative: 17.4 % (ref 12.0–46.0)
Lymphs Abs: 1.1 10*3/uL (ref 0.7–4.0)
MCHC: 33.1 g/dL (ref 30.0–36.0)
MCV: 91.9 fl (ref 78.0–100.0)
Monocytes Absolute: 0.5 10*3/uL (ref 0.1–1.0)
Monocytes Relative: 7.6 % (ref 3.0–12.0)
Neutro Abs: 4.8 10*3/uL (ref 1.4–7.7)
Neutrophils Relative %: 73.4 % (ref 43.0–77.0)
Platelets: 319 10*3/uL (ref 150.0–400.0)
RBC: 4.72 Mil/uL (ref 3.87–5.11)
RDW: 13.7 % (ref 11.5–15.5)
WBC: 6.6 10*3/uL (ref 4.0–10.5)

## 2024-02-07 LAB — HEPATIC FUNCTION PANEL
ALT: 18 U/L (ref 0–35)
AST: 16 U/L (ref 0–37)
Albumin: 4.7 g/dL (ref 3.5–5.2)
Alkaline Phosphatase: 80 U/L (ref 39–117)
Bilirubin, Direct: 0.1 mg/dL (ref 0.0–0.3)
Total Bilirubin: 0.5 mg/dL (ref 0.2–1.2)
Total Protein: 7 g/dL (ref 6.0–8.3)

## 2024-02-07 LAB — BASIC METABOLIC PANEL
BUN: 21 mg/dL (ref 6–23)
CO2: 28 meq/L (ref 19–32)
Calcium: 9.5 mg/dL (ref 8.4–10.5)
Chloride: 106 meq/L (ref 96–112)
Creatinine, Ser: 0.67 mg/dL (ref 0.40–1.20)
GFR: 84.63 mL/min (ref >60.00)
Glucose, Bld: 99 mg/dL (ref 70–99)
Potassium: 4.8 meq/L (ref 3.5–5.1)
Sodium: 140 meq/L (ref 135–145)

## 2024-02-07 LAB — VITAMIN D 25 HYDROXY (VIT D DEFICIENCY, FRACTURES): VITD: 26.79 ng/mL — ABNORMAL LOW (ref 30.00–100.00)

## 2024-02-07 LAB — HEMOGLOBIN A1C: Hgb A1c MFr Bld: 5.7 % (ref 4.6–6.5)

## 2024-02-07 LAB — TSH: TSH: 1.28 u[IU]/mL (ref 0.35–5.50)

## 2024-02-07 LAB — T4, FREE: Free T4: 0.76 ng/dL (ref 0.60–1.60)

## 2024-02-07 MED ORDER — VITAMIN D3 50 MCG (2000 UT) PO CAPS: 4000.0000 [IU] | ORAL_CAPSULE | Freq: Every day | ORAL | 3 refills | Status: AC

## 2024-02-07 NOTE — Addendum Note (Signed)
 Addended byMadelin Headings on: 02/07/2024 06:14 PM   Modules accepted: Orders

## 2024-02-07 NOTE — Patient Instructions (Addendum)
  Take blood pressure readings twice a day for  3-5 days  days and then periodically .To ensure below 140/90   .Send in readings  Bring monitor to next visits  any provider  change to vit d3 4000 international units per day .(OTC)  Will do neurology consults about memory and family hx  checking thryoid and b12 today.  Ask your immunologist about updating prevnar 20 or other  jan 26 will be a 5 years interim.  Check with your dentist about  [facility] about the osa and pulmonary Dr fields et al about the meloxicam   Plan fu visit in 3 months to make sure all  problems have been addressed .

## 2024-02-09 ENCOUNTER — Other Ambulatory Visit: Payer: Self-pay | Admitting: *Deleted

## 2024-02-09 MED ORDER — MELOXICAM 15 MG PO TABS: 15.0000 mg | ORAL_TABLET | Freq: Every day | ORAL | 3 refills | Status: DC

## 2024-02-17 ENCOUNTER — Encounter: Payer: Self-pay | Admitting: Internal Medicine

## 2024-02-17 NOTE — Progress Notes (Signed)
 Results are  in range or stable  Except vit d still on low side and b12 is normal but low normal  I advise add vit b12 1000 mcg or more dissolvable  or sublingual to take daily for now in case .   Plan  check vit d level and b12 level in future we can do at next visit  if appropriate.

## 2024-03-19 ENCOUNTER — Ambulatory Visit: Admitting: Family Medicine

## 2024-03-19 ENCOUNTER — Ambulatory Visit: Payer: Medicare Other | Admitting: Allergy

## 2024-04-17 ENCOUNTER — Other Ambulatory Visit (HOSPITAL_BASED_OUTPATIENT_CLINIC_OR_DEPARTMENT_OTHER): Payer: Self-pay | Admitting: Obstetrics & Gynecology

## 2024-04-17 DIAGNOSIS — E559 Vitamin D deficiency, unspecified: Secondary | ICD-10-CM

## 2024-04-17 NOTE — Progress Notes (Signed)
 Follow Up Note  RE: Leslie Reed MRN: [id number] DOB: [birth year] Date of Office Visit: 04/18/2024  Referring provider: Reginal Capra, MD Primary care provider: Reginal Capra, MD  Chief Complaint: Other (cure) and Immunotherapy (Would like to go over current covid protocol  for immuno compromised patients // bad breath and terrible taste was given acid reflux med and has issues with drainage needs a better nasal spray )  History of Present Illness: I had the pleasure of seeing Leslie Reed for a [facility]  on 04/18/2024. She is a 78 y.o. female, who is being followed for hypogammaglobulinemia, bronchiectasis, chronic rhinitis. Her previous allergy office visit was on 09/19/2023 with Dr. Burdette Carolin. Today is a regular follow up visit.  Discussed the use of AI scribe software for clinical note transcription with the patient, who gave verbal consent to proceed.    She has been experiencing a terrible taste in her mouth and bad breath for almost two months. Initially suspecting dental issues, she visited a dentist three weeks ago, but x-rays showed no infection. She suspects the symptoms might be related to nasal drainage, which she has had for 25 years, or acid reflux, which she has experienced over the years but never with these symptoms. She was recently prescribed medication for acid reflux.  She has a long-standing history of nasal drainage, waking up with white mucus that she coughs up. This is the first time she has experienced a bad taste and breath associated with it. No yellowish discoloration of the mucus is noted.  She has a history of acid reflux, which has primarily caused digestive discomfort without the current symptoms of bad taste and breath. She has been using Listerine multiple times a day and brushing her teeth frequently to manage the symptoms.  She mentions memory issues that have been worsening, with an upcoming appointment with a  neurologist. She has not had a physical examination in many years and expresses frustration with her current healthcare management.  Her blood pressure has been variable, with readings as high as 167/unknown and as low as 134/82. She was recently prescribed a low dose of blood pressure medication. She monitors her blood pressure at home.  She is taking meloxicam  for arthritis pain, which she uses as needed. 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  She has a history of cholesterol management, having been taken off cholesterol medication after a scan showed no plaque in her heart.     Assessment and Plan: Leslie Reed is a 78 y.o. female with: Hypogammaglobulinemia (HCC) Past history - Patient referred by pulmonology for low IgG and IgM. History of frequent respiratory infections, mild central bronchiectasis, mother who passed away from IPF. Up to date with COVID-19 vaccine, flu, pneumovax and prevnar.  2021 bloodwork diptheria, tetanus and s. Pneumonia titers adequate. Alpha 1 level and total complement level normal. No history of meningitis or blood clots. Arthralgias and headaches improved with Cuvitru 20g every 4 weeks. Last Cuvitru infusion in February 2023. Shingles in 2023. 2024 IgG 611. Interim history - no infections/abx.  Keep track of infections and antibiotics use.   Bad taste in mouth - possible sinus infection vs gerd Normal dental exam. Still has PND but not sure if she has a sinus infection or not.  Will treat for possible sinus infection - start Augmentin  875mg  BID x 10 days due to prolonged symptoms for months. See  handout for lifestyle and dietary modifications. Omeprazole 20mg  once day - nothing to eat or drink for 20-30 minutes afterwards.   Bronchiectasis without complication (HCC) Past history - 2022 spirometry normal. 2023 CT chest.  Interim history - coughing/mucous in the mornings. Needs new flutter valve.   Monitor symptoms.  Continue to use the flutter valve 1-2 times per day - use in the mornings.  Buy a new one on amazon.    Chronic rhinitis Past history - Perennial rhinitis symptoms and using Vick's vapor rub. 2021 skin testing negative to environmental allergies.  Interim history - PND and not sure what nasal spray is for what symptoms.  Will simplify regimen by using a combination nasal spray.  Start Ryaltris (olopatadine + mometasone nasal spray combination) 1-2 sprays per nostril twice a day.  This replaces your other nasal sprays. If this works well for you, then have pharmacy ship the medication to your home - prescription already sent in.  Nasal saline spray (i.e., Simply Saline) or nasal saline lavage (i.e., NeilMed) is recommended as needed and prior to medicated nasal sprays.  Return in about 6 months (around 10/19/2024). Make sure you go to your neurology appointment on 04/26/2024.  Follow up with PCP regarding your blood pressure.    Return in about 6 months (around 10/19/2024).  Meds ordered this encounter  Medications   amoxicillin -clavulanate (AUGMENTIN ) 875-125 MG tablet    Sig: Take 1 tablet by mouth 2 (two) times daily.    Dispense:  20 tablet    Refill:  0   Olopatadine-Mometasone (RYALTRIS) 665-25 MCG/ACT SUSP    Sig: Place 1-2 sprays into the nose in the morning and at bedtime.    Dispense:  29 g    Refill:  5   Lab Orders  No laboratory test(s) ordered today    Diagnostics: None.   Medication List:  Current Outpatient Medications  Medication Sig Dispense Refill   amoxicillin -clavulanate (AUGMENTIN ) 875-125 MG tablet Take 1 tablet by mouth 2 (two) times daily. 20 tablet 0   Cholecalciferol (VITAMIN D3) 50 MCG (2000 UT) capsule Take 2 capsules (4,000 Units total) by mouth daily. 60 capsule 3   ipratropium (ATROVENT ) 0.03 % nasal spray Place 1-2 sprays into both nostrils 2 (two) times daily as needed (nasal drainage). 30 mL 5   meloxicam  (MOBIC ) 15 MG  tablet Take 1 tablet (15 mg total) by mouth daily. Take for 1 week, then as needed. 30 tablet 3   Minoxidil (ROGAINE EX) Apply topically.     olmesartan (BENICAR) 5 MG tablet Take 5 mg by mouth daily.     Olopatadine-Mometasone (RYALTRIS) 665-25 MCG/ACT SUSP Place 1-2 sprays into the nose in the morning and at bedtime. 29 g 5   omeprazole (PRILOSEC) 20 MG capsule Take 20 mg by mouth daily.     Respiratory Therapy Supplies (FLUTTER) DEVI 1 each by Does not apply route 2 (two) times daily. (Patient not taking: Reported on 04/18/2024) 1 each 0   Current Facility-Administered Medications  Medication Dose Route Frequency Provider Last Rate Last Admin   0.9 %  sodium chloride  infusion  500 mL Intravenous Continuous Nandigam, Kavitha V, MD       Allergies: No Known Allergies I reviewed her past medical history, social history, family history, and environmental history and no significant changes have been reported from her previous visit.  Review of Systems  Constitutional:  Negative for appetite change, chills, fever and unexpected weight change.  HENT:  Positive for postnasal drip.  Negative for congestion and rhinorrhea.   Eyes:  Negative for itching.  Respiratory:  Positive for cough. Negative for chest tightness, shortness of breath and wheezing.   Cardiovascular:  Negative for chest pain.  Gastrointestinal:  Negative for abdominal pain.  Genitourinary:  Negative for difficulty urinating.  Allergic/Immunologic: Negative for environmental allergies and food allergies.    Objective: BP 134/82   Pulse 74   Temp (!) 97.4 F (36.3 C) (Temporal)   Resp 18   Ht 5' 3.9" (1.623 m)   Wt 141 lb 9.6 oz (64.2 kg) Comment: with shoes  LMP 11/29/1986   SpO2 96%   BMI 24.38 kg/m  Body mass index is 24.38 kg/m. Physical Exam Vitals and nursing note reviewed.  Constitutional:      Appearance: Normal appearance. She is well-developed.  HENT:     Head: Normocephalic and atraumatic.     Right  Ear: Tympanic membrane and external ear normal.     Left Ear: Tympanic membrane and external ear normal.     Nose: Nose normal. No congestion or rhinorrhea.     Mouth/Throat:     Mouth: Mucous membranes are moist.     Pharynx: Oropharynx is clear.  Eyes:     Conjunctiva/sclera: Conjunctivae normal.  Cardiovascular:     Rate and Rhythm: Normal rate and regular rhythm.     Heart sounds: Normal heart sounds. No murmur heard.    No friction rub. No gallop.  Pulmonary:     Effort: Pulmonary effort is normal.     Breath sounds: Normal breath sounds. No wheezing, rhonchi or rales.  Musculoskeletal:     Cervical back: Neck supple.  Skin:    General: Skin is warm.  Neurological:     Mental Status: She is alert and oriented to person, place, and time.  Psychiatric:        Mood and Affect: Mood normal.        Behavior: Behavior normal.   Previous notes and tests were reviewed. The plan was reviewed with the patient/family, and all questions/concerned were addressed.  It was my pleasure to see Leslie Reed today and participate in her care. Please feel free to contact me with any questions or concerns.  Sincerely,  Eudelia Hero, DO Allergy & Immunology  [facility]  [place] office: [phone] [facility] office: [phone]

## 2024-04-18 ENCOUNTER — Ambulatory Visit (INDEPENDENT_AMBULATORY_CARE_PROVIDER_SITE_OTHER): Admitting: Allergy

## 2024-04-18 ENCOUNTER — Other Ambulatory Visit: Payer: Self-pay

## 2024-04-18 ENCOUNTER — Encounter: Payer: Self-pay | Admitting: Allergy

## 2024-04-18 VITALS — BP 134/82 | HR 74 | Temp 97.4°F | Resp 18 | Ht 63.9 in | Wt 141.6 lb

## 2024-04-18 DIAGNOSIS — D801 Nonfamilial hypogammaglobulinemia: Secondary | ICD-10-CM

## 2024-04-18 DIAGNOSIS — M6740 Ganglion, unspecified site: Secondary | ICD-10-CM | POA: Insufficient documentation

## 2024-04-18 DIAGNOSIS — R438 Other disturbances of smell and taste: Secondary | ICD-10-CM

## 2024-04-18 DIAGNOSIS — D2262 Melanocytic nevi of left upper limb, including shoulder: Secondary | ICD-10-CM | POA: Insufficient documentation

## 2024-04-18 DIAGNOSIS — D224 Melanocytic nevi of scalp and neck: Secondary | ICD-10-CM | POA: Insufficient documentation

## 2024-04-18 DIAGNOSIS — K219 Gastro-esophageal reflux disease without esophagitis: Secondary | ICD-10-CM

## 2024-04-18 DIAGNOSIS — D225 Melanocytic nevi of trunk: Secondary | ICD-10-CM | POA: Insufficient documentation

## 2024-04-18 DIAGNOSIS — D2370 Other benign neoplasm of skin of unspecified lower limb, including hip: Secondary | ICD-10-CM | POA: Insufficient documentation

## 2024-04-18 DIAGNOSIS — J479 Bronchiectasis, uncomplicated: Secondary | ICD-10-CM

## 2024-04-18 DIAGNOSIS — C44519 Basal cell carcinoma of skin of other part of trunk: Secondary | ICD-10-CM | POA: Insufficient documentation

## 2024-04-18 DIAGNOSIS — D1720 Benign lipomatous neoplasm of skin and subcutaneous tissue of unspecified limb: Secondary | ICD-10-CM | POA: Insufficient documentation

## 2024-04-18 DIAGNOSIS — D1801 Hemangioma of skin and subcutaneous tissue: Secondary | ICD-10-CM | POA: Insufficient documentation

## 2024-04-18 DIAGNOSIS — D2330 Other benign neoplasm of skin of unspecified part of face: Secondary | ICD-10-CM | POA: Insufficient documentation

## 2024-04-18 DIAGNOSIS — J310 Chronic rhinitis: Secondary | ICD-10-CM | POA: Diagnosis not present

## 2024-04-18 DIAGNOSIS — Z808 Family history of malignant neoplasm of other organs or systems: Secondary | ICD-10-CM | POA: Insufficient documentation

## 2024-04-18 MED ORDER — RYALTRIS 665-25 MCG/ACT NA SUSP
1.0000 | Freq: Two times a day (BID) | NASAL | 5 refills | Status: DC
Start: 2024-04-18 — End: 2024-10-17

## 2024-04-18 MED ORDER — AMOXICILLIN-POT CLAVULANATE 875-125 MG PO TABS: 1.0000 | ORAL_TABLET | Freq: Two times a day (BID) | ORAL | 0 refills | Status: DC

## 2024-04-18 NOTE — Patient Instructions (Addendum)
 Low IgG:  Keep track of infections and antibiotics use.   Possible sinus infection Start Augmentin  875mg  twice a day for 10 days.   Rhinitis:  Start Ryaltris (olopatadine + mometasone nasal spray combination) 1-2 sprays per nostril twice a day.  This replaces your other nasal sprays. If this works well for you, then have pharmacy ship the medication to your home - prescription already sent in.  Nasal saline spray (i.e., Simply Saline) or nasal saline lavage (i.e., NeilMed) is recommended as needed and prior to medicated nasal sprays.  Coughing Monitor symptoms.  Continue to use the flutter valve 1-2 times per day - use in the mornings.  Buy a new one on amazon.   Reflux See handout for lifestyle and dietary modifications. Omeprazole 20mg  once day - nothing to eat or drink for 20-30 minutes afterwards.   Follow up in 6 months or sooner if needed.  Make sure you go to your neurology appointment on 04/26/2024.   Follow up with PCP regarding your blood pressure.

## 2024-04-19 NOTE — Telephone Encounter (Signed)
Pt states she does not need

## 2024-04-19 NOTE — Telephone Encounter (Signed)
LMOVM for pt to call regarding refill request

## 2024-04-26 ENCOUNTER — Ambulatory Visit: Payer: PRIVATE HEALTH INSURANCE | Admitting: Neurology

## 2024-04-26 ENCOUNTER — Telehealth: Payer: Self-pay | Admitting: Neurology

## 2024-04-26 ENCOUNTER — Encounter: Payer: Self-pay | Admitting: Neurology

## 2024-04-26 VITALS — BP 132/78 | HR 82 | Ht 64.0 in | Wt 142.2 lb

## 2024-04-26 DIAGNOSIS — G3184 Mild cognitive impairment, so stated: Secondary | ICD-10-CM | POA: Diagnosis not present

## 2024-04-26 DIAGNOSIS — G4733 Obstructive sleep apnea (adult) (pediatric): Secondary | ICD-10-CM | POA: Diagnosis not present

## 2024-04-26 NOTE — Telephone Encounter (Signed)
 Referral for neuropsychology fax to [facility]. Phone: [phone], Fax: [phone]

## 2024-04-26 NOTE — Progress Notes (Signed)
 [facility]  Provider:  Dr Leslie Reed Referring Provider: Reginal Capra, MD Primary Care Physician:  Leslie Capra, MD  Chief Complaint  Patient presents with   Memory Loss    RM 2, Pt alone. Re-visit for memory loss. Pt states she has noticed some issues herself. States short term mem is getting worse, unable to multi-task. Pt still works 5-6 days/wk.    HPI:  Leslie Reed is a 78 y.o. female and seen here upon referral from Dr. Ethel Reed for a Consultation/ Evaluation of memory loss.   This patient reports onset of memory loss over a period of 7 years. Just last week she started BP medications, she has had extraordinary challenges and stressors, her son Leslie Reed is now on a pancreas transplant list.   She already started on an antibiotic last week as well and she has been on antiallergy medicine and antiacids.  She has obstructive sleep apnea patient here also uses CPAP compliantly 100% also she declares she does not like it.  It is an auto titration CPAP machine between 5 and 15 cm water with 3 cm EPR and her residual AHI is 2.9 and her 95th percentile pressure is close to 12 cm of water.  Here I see no reason to do any changes okay.  The Epworth sleepiness score is endorsed at 2 points fatigue severity at 15 points geriatric depression scored 4 out of 15 points.  Coming back to her cognitive status she endorsed all activities of daily living as independent so transportation managing finances communication.  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  She has 4 adult children.  Today's Montreal cognitive assessment test she scored 24 out of 30 points.  The  clock, trail making and cube were fine,  naming the animals correctly,  doing serial 7th , , but only remembered 1 out of 5 words.    Father had dementia, mother died  too young idiopathic pulmonary fibrosis.       Leslie Reed is a 78 y.o. female , seen here as in a referral from Dr. Ethel Reed. She had seen me 5-6 year ago for memory. Had MRI - negative.  I have also followed Leslie Reed son Leslie Reed, here in the clinic.  Her concern today is not so much directed toward sleep, she is concerned about her progressive memory deficits.  He has mostly insomnia concerns.  She reports weight gain, daytime fatigue chest pain back pain, hearing loss and ringing in her ears, memory loss, headaches insomnia and restless legs the feeling of not getting enough sleep or not enough quality sleep.  She has a history of cholecystectomy 2 breast tumors surgically removed appendectomy, polypectomy during colonoscopy, she had 4 C-sections, and she is status post partial hysterectomy.  Her only medication at this time is vitamin D  50,000 units.    Chief complaint according to patient : Leslie Reed is concerned about memory deficits, attention deficits, and still has poor sleep and insomnia issues. She has a feeling that she cannot access her vocabulary, she also gave anecdotal evidence.  She met with friends all of them had watch the same movie but she could not recall the content of the story line.  She has difficulties remembering names of sports, she has difficulties recalling names of colleagues from 30 years ago that she would otherwise  felt easy to recall.  She was usually putting herself on her photographic memory but she feels now that if she does not repeat certain information over and over or writes it down it would not be accessible a week later.  Immediate recall is not her problem - it is the intermediate recall.   Review of Systems: Out of a complete 14 system review, the patient complains of only the following symptoms, and all other reviewed systems are negative.  See above   Social History   Socioeconomic History   Marital status: Married    Spouse name: Not on  file   Number of children: 4   Years of education: 18 years    Highest education level: Master's degree (e.g., MA, MS, MEng, MEd, MSW, MBA)  Occupational History   Not on file  Tobacco Use   Smoking status: Never    Passive exposure: Yes   Smokeless tobacco: Never  Vaping Use   Vaping status: Never Used  Substance and Sexual Activity   Alcohol use: Not Currently    Comment: occ   Drug use: No   Sexual activity: Not Currently    Partners: Male    Birth control/protection: Surgical, Post-menopausal    Comment: TAH  Other Topics Concern   Not on file  Social History Narrative   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   hhof 2   G6 p4    Pet cats    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    [address] 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   Neg td    Sleep 4-6 interrupted    Family is WISC father with alzheimers   Social Drivers of Health   Financial Resource Strain: Low Risk  (04/17/2024)   Received from [facility]   Overall Financial Resource Strain (CARDIA)    Difficulty of Paying Living Expenses: Not hard at all  Food Insecurity: No Food Insecurity (04/17/2024)   Received from [facility]   Hunger Vital Sign    Worried About Running Out of Food in the Last Year: Never true    Ran Out of Food in the Last Year: Never true  Transportation Needs: No Transportation Needs (04/17/2024)   [facility] from [facility] - Transportation    Lack of Transportation (Medical): No    Lack of Transportation (Non-Medical): No  Physical Activity: Insufficiently Active (04/27/2022)   Exercise Vital Sign    Days of Exercise per Week: 1 day    Minutes of Exercise per Session: 30 min  Stress: Stress Concern Present (04/27/2022)   [employer] of Occupational Health - Occupational Stress Questionnaire    Feeling of Stress : To some extent  Social Connections: Moderately Isolated (04/27/2022)   Social Connection and Isolation Panel [NHANES]    Frequency of Communication with Friends and  Family: More than three times a week    Frequency of Social Gatherings with Friends and Family: More than three times a week    Attends Religious Services: Never    Database administrator or Organizations: No    Attends Engineer, structural: Not on file    Marital Status: Married  Catering manager Violence: Not on file    Family History  Problem Relation Age of Onset   Diabetes Mother    Pulmonary fibrosis Mother        4 deceased   Hypertension Mother    Hypertension Father    Alzheimer's disease Father    Kidney failure Father  Diabetes Sister    Hypertension Brother    Sleep apnea Brother    Diabetes Maternal Grandmother    Colon cancer Maternal Grandfather    Colon polyps Daughter    Thyroid  disease Daughter    Psoriasis Daughter    Thyroid  disease Daughter    Diabetes Son    Allergy (severe) Son    Depression Son    Esophageal cancer Neg Hx    Rectal cancer Neg Hx    Stomach cancer Neg Hx     Past Medical History:  Diagnosis Date   Allergic rhinitis    Allergy    Arthritis    hands, lower back   Asthma    Bronchitis    uses inhaler prn   Cataract    just being monitored - no surgery   Colitis 8/04   Diverticulitis    GERD (gastroesophageal reflux disease)    diet controlled, no meds   History of migraine headaches    Hx of colonic polyp    Hx of varicella    Hyperlipidemia    Insomnia    Interstitial cystitis 10/90   Mitral valve prolapse    Osteopenia    Scoliosis 2005   Sleep apnea    Tick bite 2014   STARI-antibiotics x 5 weeks   Tracheobronchopathia-osteochondroplastica     Past Surgical History:  Procedure Laterality Date   ABDOMINAL HYSTERECTOMY     partial  for pain no cancer     ADENOIDECTOMY     APPENDECTOMY     BREAST BIOPSY Right    x 2    BRONCHIAL WASHINGS  04/22/2020   Procedure: BRONCHIAL lavage;  Surgeon: Joesph Mussel, DO;  Location: [facility];  Service: Endoscopy;;   CESAREAN SECTION     x4    CHOLECYSTECTOMY     colon polyps     COLONOSCOPY  09/09/2016   Dr.Nandigam   DILATION AND CURETTAGE OF UTERUS     x 2   TONSILLECTOMY     VIDEO BRONCHOSCOPY N/A 04/22/2020   Procedure: VIDEO BRONCHOSCOPY WITHOUT FLUORO;  Surgeon: Joesph Mussel, DO;  Location: [facility];  Service: Endoscopy;  Laterality: N/A;   WISDOM TOOTH EXTRACTION      Current Outpatient Medications  Medication Sig Dispense Refill   amoxicillin -clavulanate (AUGMENTIN ) 875-125 MG tablet Take 1 tablet by mouth 2 (two) times daily. 20 tablet 0   Cholecalciferol (VITAMIN D3) 50 MCG (2000 UT) capsule Take 2 capsules (4,000 Units total) by mouth daily. 60 capsule 3   cyanocobalamin  (VITAMIN B12) 1000 MCG tablet Take 1,000 mcg by mouth daily.     Minoxidil (ROGAINE EX) Apply topically.     olmesartan (BENICAR) 5 MG tablet Take 5 mg by mouth daily.     Olopatadine-Mometasone (RYALTRIS) 665-25 MCG/ACT SUSP Place 1-2 sprays into the nose in the morning and at bedtime. 29 g 5   omeprazole (PRILOSEC) 20 MG capsule Take 20 mg by mouth daily.     ipratropium (ATROVENT ) 0.03 % nasal spray Place 1-2 sprays into both nostrils 2 (two) times daily as needed (nasal drainage). (Patient not taking: Reported on 04/26/2024) 30 mL 5   meloxicam  (MOBIC ) 15 MG tablet Take 1 tablet (15 mg total) by mouth daily. Take for 1 week, then as needed. (Patient not taking: Reported on 04/26/2024) 30 tablet 3   Respiratory Therapy Supplies (FLUTTER) DEVI 1 each by Does not apply route 2 (two) times daily. (Patient not taking: Reported on 04/18/2024) 1 each 0  Current Facility-Administered Medications  Medication Dose Route Frequency Provider Last Rate Last Admin   0.9 %  sodium chloride  infusion  500 mL Intravenous Continuous Nandigam, Kavitha V, MD        Allergies as of 04/26/2024   (No Known Allergies)    Vitals: BP 132/78 (BP Location: Left Arm, Patient Position: Sitting, Cuff Size: Normal)   Pulse 82   Ht 5\' 4"  (1.626 m)   Wt 142 lb 3.2 oz  (64.5 kg)   LMP 11/29/1986   BMI 24.41 kg/m  Last Weight:  Wt Readings from Last 1 Encounters:  04/26/24 142 lb 3.2 oz (64.5 kg)   Last Height:   Ht Readings from Last 1 Encounters:  04/26/24 5\' 4"  (1.626 m)   Last BMI: @LASTBMI  Physical exam:  General: The patient is awake, alert and appears not in acute distress.  The patient is well groomed. Head: Normocephalic, atraumatic.  Neck is supple. No Goiter   Neck circumference:14 Cardiovascular:  Regular rate and palpable peripheral pulse:  Respiratory: clear to auscultation.  Mallampati 2, Skin:  Without evidence of edema, or rash Trunk: BMI is  and patient  has normal posture.   Neurologic exam : The patient is awake and alert, oriented to place and time.  Memory subjective  described as impaired.    04/26/2024    3:00 PM 12/07/2018    9:25 AM 11/17/2017   11:21 AM  Montreal Cognitive Assessment   Visuospatial/ Executive (0/5) 5 5 5   Naming (0/3) 3 3 3   Attention: Read list of digits (0/2) 1 2 2   Attention: Read list of letters (0/1) 1 1 1   Attention: Serial 7 subtraction starting at 100 (0/3) 3 3 3   Language: Repeat phrase (0/2) 2 2 2   Language : Fluency (0/1) 1 1 1   Abstraction (0/2) 2 2 2   Delayed Recall (0/5) 1 3 2   Orientation (0/6) 5 6 6   Total 24 28 27      There is a normal attention span & concentration ability.   Speech is fluent without  dysarthria, dysphonia or aphasia.  Mood and affect are appropriate.  Cranial nerves: Pupils are equal and briskly reactive to light. Funduscopic exam without  evidence of pallor or edema. Extraocular movements  in vertical and horizontal planes intact and without nystagmus. Visual fields by finger perimetry are intact. Hearing to finger rub intact.  Facial sensation intact to fine touch. Facial motor strength is symmetric and tongue and uvula move midline.  Motor exam:   Normal tone and normal muscle bulk and symmetric normal strength in all extremities. Grip Strength  equal  Proximal strength of shoulder muscles and hip flexors was intact .  Sensory:  Fine touch and vibration were tested .  Proprioception was tested in the upper extremities only   Coordination: Rapid alternating movements in the fingers/hands were normal.  Finger-to-nose maneuver was tested and showed no evidence of ataxia, dysmetria or tremor.  Gait and station: Patient walked without assistive device - Core Strength within normal limits.  Stance is stable and of normal base.  Not shuffling , turning with 3 steps. Normal arswing   Deep tendon reflexes: in the  upper and lower extremities are symmetric. Babinski maneuver response is  downgoing.   Assessment: Total time for face to face interview and examination, for review of  images and laboratory testing, neurophysiology testing and pre-existing records, including out-of -network , was 45 minutes. 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  Some amnestic and some short term memory loss.  Fully independent in daily life    Plan:  Treatment plan and additional workup planned after today includes:   1)  ATN, GNA dementia battery.  - if MRI brain normal and ATN positive will order  amyloid PET , if negative  PET metabolic . 2)  MRI brain , with and without. , ruling out vascular dementia, tumor, strokes, etc.  3)  EEG 4) Aricept 5 mg for 30 days and follow with 10 mg thereafter daily.  5) referral to neuropsychology.  Follow up  with me in 5-6 months   Neomia Banner, MD  [facility] Sleep Board certified by The [employer] of Sleep Medicine and Diplomate of the [employer] of Sleep Medicine. Board certified In Neurology through the ABPN, Fellow of the [employer] of Neurology.

## 2024-04-26 NOTE — Patient Instructions (Addendum)
 Assessment: Total time for face to face interview and examination, for review of  images and laboratory testing, neurophysiology testing and pre-existing records, including out-of -network , was 45 minutes. 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  Some amnestic and some short term memory loss.  Fully independent in daily life    Plan:  Treatment plan and additional workup planned after today includes:   1)  ATN, GNA dementia battery.  - if MRI brain normal and ATN positive will order  amyloid PET , if negative  PET metabolic . 2)  MRI brain , with and without. , ruling out vascular dementia, tumor, strokes, etc.  3)  EEG 4) Aricept 5 mg for 30 days and follow with 10 mg thereafter daily.  5) neuro cognitive testing with neuropsychology.   Follow up  with me or NP for memory testing in 5-6 months

## 2024-04-27 ENCOUNTER — Ambulatory Visit: Payer: Self-pay | Admitting: Neurology

## 2024-04-27 ENCOUNTER — Telehealth: Payer: Self-pay | Admitting: Neurology

## 2024-04-27 NOTE — Telephone Encounter (Signed)
 no auth required sent to GI [phone]

## 2024-05-01 ENCOUNTER — Emergency Department (HOSPITAL_BASED_OUTPATIENT_CLINIC_OR_DEPARTMENT_OTHER)

## 2024-05-01 ENCOUNTER — Encounter (HOSPITAL_BASED_OUTPATIENT_CLINIC_OR_DEPARTMENT_OTHER): Payer: Self-pay | Admitting: Emergency Medicine

## 2024-05-01 ENCOUNTER — Other Ambulatory Visit: Payer: Self-pay

## 2024-05-01 ENCOUNTER — Emergency Department (HOSPITAL_COMMUNITY): Admitting: Anesthesiology

## 2024-05-01 ENCOUNTER — Inpatient Hospital Stay (HOSPITAL_BASED_OUTPATIENT_CLINIC_OR_DEPARTMENT_OTHER)
Admission: EM | Admit: 2024-05-01 | Discharge: 2024-05-06 | DRG: 356 | Disposition: A | Discharge status: Home / Self Care | Attending: Surgery | Admitting: Surgery

## 2024-05-01 ENCOUNTER — Encounter (HOSPITAL_COMMUNITY): Admission: EM | Disposition: A | Discharge status: Home / Self Care | Payer: Self-pay | Source: Home / Self Care

## 2024-05-01 DIAGNOSIS — Z83719 Family history of colon polyps, unspecified: Secondary | ICD-10-CM

## 2024-05-01 DIAGNOSIS — E785 Hyperlipidemia, unspecified: Secondary | ICD-10-CM | POA: Diagnosis present

## 2024-05-01 DIAGNOSIS — K2980 Duodenitis without bleeding: Secondary | ICD-10-CM | POA: Diagnosis present

## 2024-05-01 DIAGNOSIS — Z9049 Acquired absence of other specified parts of digestive tract: Secondary | ICD-10-CM | POA: Diagnosis not present

## 2024-05-01 DIAGNOSIS — J45909 Unspecified asthma, uncomplicated: Secondary | ICD-10-CM | POA: Diagnosis not present

## 2024-05-01 DIAGNOSIS — K659 Peritonitis, unspecified: Secondary | ICD-10-CM | POA: Diagnosis present

## 2024-05-01 DIAGNOSIS — Z820 Family history of epilepsy and other diseases of the nervous system: Secondary | ICD-10-CM

## 2024-05-01 DIAGNOSIS — Z90710 Acquired absence of both cervix and uterus: Secondary | ICD-10-CM

## 2024-05-01 DIAGNOSIS — Z7401 Bed confinement status: Secondary | ICD-10-CM | POA: Diagnosis not present

## 2024-05-01 DIAGNOSIS — Z841 Family history of disorders of kidney and ureter: Secondary | ICD-10-CM | POA: Diagnosis not present

## 2024-05-01 DIAGNOSIS — Z860100 Personal history of colon polyps, unspecified: Secondary | ICD-10-CM

## 2024-05-01 DIAGNOSIS — I10 Essential (primary) hypertension: Secondary | ICD-10-CM

## 2024-05-01 DIAGNOSIS — K265 Chronic or unspecified duodenal ulcer with perforation: Secondary | ICD-10-CM

## 2024-05-01 DIAGNOSIS — Z791 Long term (current) use of non-steroidal anti-inflammatories (NSAID): Secondary | ICD-10-CM | POA: Diagnosis not present

## 2024-05-01 DIAGNOSIS — Z833 Family history of diabetes mellitus: Secondary | ICD-10-CM

## 2024-05-01 DIAGNOSIS — Z800 Family history of malignant neoplasm of digestive organs: Secondary | ICD-10-CM

## 2024-05-01 DIAGNOSIS — Z8249 Family history of ischemic heart disease and other diseases of the circulatory system: Secondary | ICD-10-CM

## 2024-05-01 DIAGNOSIS — K589 Irritable bowel syndrome without diarrhea: Secondary | ICD-10-CM | POA: Diagnosis present

## 2024-05-01 DIAGNOSIS — R0689 Other abnormalities of breathing: Secondary | ICD-10-CM | POA: Diagnosis not present

## 2024-05-01 DIAGNOSIS — K219 Gastro-esophageal reflux disease without esophagitis: Secondary | ICD-10-CM | POA: Diagnosis present

## 2024-05-01 DIAGNOSIS — K5710 Diverticulosis of small intestine without perforation or abscess without bleeding: Secondary | ICD-10-CM | POA: Diagnosis present

## 2024-05-01 DIAGNOSIS — Z818 Family history of other mental and behavioral disorders: Secondary | ICD-10-CM | POA: Diagnosis not present

## 2024-05-01 DIAGNOSIS — R1084 Generalized abdominal pain: Secondary | ICD-10-CM | POA: Diagnosis not present

## 2024-05-01 DIAGNOSIS — G4733 Obstructive sleep apnea (adult) (pediatric): Secondary | ICD-10-CM | POA: Diagnosis not present

## 2024-05-01 HISTORY — PX: LAPAROSCOPY: SHX197

## 2024-05-01 LAB — CBC WITH DIFFERENTIAL/PLATELET
Abs Immature Granulocytes: 0.01 10*3/uL (ref 0.00–0.07)
Basophils Absolute: 0 10*3/uL (ref 0.0–0.1)
Basophils Relative: 1 %
Eosinophils Absolute: 0.1 10*3/uL (ref 0.0–0.5)
Eosinophils Relative: 1 %
HCT: 38.7 % (ref 36.0–46.0)
Hemoglobin: 12.7 g/dL (ref 12.0–15.0)
Immature Granulocytes: 0 %
Lymphocytes Relative: 22 %
Lymphs Abs: 1.8 10*3/uL (ref 0.7–4.0)
MCH: 30.5 pg (ref 26.0–34.0)
MCHC: 32.8 g/dL (ref 30.0–36.0)
MCV: 93 fL (ref 80.0–100.0)
Monocytes Absolute: 0.7 10*3/uL (ref 0.1–1.0)
Monocytes Relative: 9 %
Neutro Abs: 5.6 10*3/uL (ref 1.7–7.7)
Neutrophils Relative %: 67 %
Platelets: 108 10*3/uL — ABNORMAL LOW (ref 150–400)
RBC: 4.16 MIL/uL (ref 3.87–5.11)
RDW: 13.7 % (ref 11.5–15.5)
Smear Review: ADEQUATE
WBC: 8.3 10*3/uL (ref 4.0–10.5)
nRBC: 0 % (ref 0.0–0.2)

## 2024-05-01 LAB — COMPREHENSIVE METABOLIC PANEL WITH GFR
ALT: 13 U/L (ref 0–44)
AST: 19 U/L (ref 15–41)
Albumin: 4 g/dL (ref 3.5–5.0)
Alkaline Phosphatase: 58 U/L (ref 38–126)
Anion gap: 13 (ref 5–15)
BUN: 26 mg/dL — ABNORMAL HIGH (ref 8–23)
CO2: 22 mmol/L (ref 22–32)
Calcium: 9.6 mg/dL (ref 8.9–10.3)
Chloride: 106 mmol/L (ref 98–111)
Creatinine, Ser: 0.71 mg/dL (ref 0.44–1.00)
GFR, Estimated: >60 mL/min (ref >60)
Glucose, Bld: 83 mg/dL (ref 70–99)
Potassium: 4.3 mmol/L (ref 3.5–5.1)
Sodium: 141 mmol/L (ref 135–145)
Total Bilirubin: 0.5 mg/dL (ref 0.0–1.2)
Total Protein: 6.1 g/dL — ABNORMAL LOW (ref 6.5–8.1)

## 2024-05-01 LAB — CBC
HCT: 33.2 % — ABNORMAL LOW (ref 36.0–46.0)
Hemoglobin: 11.3 g/dL — ABNORMAL LOW (ref 12.0–15.0)
MCH: 30.9 pg (ref 26.0–34.0)
MCHC: 34 g/dL (ref 30.0–36.0)
MCV: 90.7 fL (ref 80.0–100.0)
Platelets: 220 10*3/uL (ref 150–400)
RBC: 3.66 MIL/uL — ABNORMAL LOW (ref 3.87–5.11)
RDW: 13.7 % (ref 11.5–15.5)
WBC: 11.9 10*3/uL — ABNORMAL HIGH (ref 4.0–10.5)
nRBC: 0 % (ref 0.0–0.2)

## 2024-05-01 LAB — URINALYSIS, ROUTINE W REFLEX MICROSCOPIC
Bacteria, UA: NONE SEEN
Bilirubin Urine: NEGATIVE
Glucose, UA: NEGATIVE mg/dL
Hgb urine dipstick: NEGATIVE
Ketones, ur: NEGATIVE mg/dL
Nitrite: NEGATIVE
Specific Gravity, Urine: 1.028 (ref 1.005–1.030)
pH: 6.5 (ref 5.0–8.0)

## 2024-05-01 LAB — CREATININE, SERUM
Creatinine, Ser: 0.85 mg/dL (ref 0.44–1.00)
GFR, Estimated: >60 mL/min (ref >60)

## 2024-05-01 LAB — TROPONIN T, HIGH SENSITIVITY
Troponin T High Sensitivity: <15 ng/L (ref <19)
Troponin T High Sensitivity: <15 ng/L (ref <19)

## 2024-05-01 LAB — LIPASE, BLOOD: Lipase: 17 U/L (ref 11–51)

## 2024-05-01 LAB — TYPE AND SCREEN
ABO/RH(D): A POS
Antibody Screen: NEGATIVE

## 2024-05-01 LAB — ABO/RH: ABO/RH(D): A POS

## 2024-05-01 SURGERY — LAPAROSCOPY, DIAGNOSTIC
Anesthesia: General

## 2024-05-01 MED ORDER — 0.9 % SODIUM CHLORIDE (POUR BTL) OPTIME
TOPICAL | Status: DC | PRN
  Administered 2024-05-01: 1000 mL

## 2024-05-01 MED ORDER — DEXAMETHASONE SODIUM PHOSPHATE 10 MG/ML IJ SOLN
INTRAMUSCULAR | Status: DC | PRN
  Administered 2024-05-01: 10 mg via INTRAVENOUS

## 2024-05-01 MED ORDER — IOHEXOL 300 MG/ML  SOLN
100.0000 mL | Freq: Once | INTRAMUSCULAR | Status: AC | PRN
  Administered 2024-05-01: 100 mL via INTRAVENOUS

## 2024-05-01 MED ORDER — ACETAMINOPHEN 10 MG/ML IV SOLN
INTRAVENOUS | Status: DC | PRN
  Administered 2024-05-01: 1000 mg via INTRAVENOUS

## 2024-05-01 MED ORDER — METOCLOPRAMIDE HCL 5 MG/ML IJ SOLN
10.0000 mg | Freq: Once | INTRAMUSCULAR | Status: AC
  Administered 2024-05-01: 10 mg via INTRAVENOUS
  Filled 2024-05-01: qty 2

## 2024-05-01 MED ORDER — HYDROMORPHONE HCL 1 MG/ML IJ SOLN
0.5000 mg | INTRAMUSCULAR | Status: DC | PRN
  Administered 2024-05-01 – 2024-05-03 (×5): 1 mg via INTRAVENOUS
  Filled 2024-05-01 (×5): qty 1

## 2024-05-01 MED ORDER — HYDROMORPHONE HCL 1 MG/ML IJ SOLN: 0.2500 mg | INTRAMUSCULAR | Status: DC | PRN

## 2024-05-01 MED ORDER — LACTATED RINGERS IV BOLUS
1000.0000 mL | Freq: Once | INTRAVENOUS | Status: AC
  Administered 2024-05-01: 1000 mL via INTRAVENOUS

## 2024-05-01 MED ORDER — PROPOFOL 10 MG/ML IV BOLUS
INTRAVENOUS | Status: DC | PRN
  Administered 2024-05-01: 150 mg via INTRAVENOUS

## 2024-05-01 MED ORDER — DEXMEDETOMIDINE HCL IN NACL 80 MCG/20ML IV SOLN
INTRAVENOUS | Status: AC
  Filled 2024-05-01: qty 20

## 2024-05-01 MED ORDER — ONDANSETRON HCL 4 MG/2ML IJ SOLN
INTRAMUSCULAR | Status: DC | PRN
  Administered 2024-05-01: 4 mg via INTRAVENOUS

## 2024-05-01 MED ORDER — ONDANSETRON 4 MG PO TBDP: 4.0000 mg | ORAL_TABLET | Freq: Four times a day (QID) | ORAL | Status: DC | PRN

## 2024-05-01 MED ORDER — PIPERACILLIN-TAZOBACTAM 3.375 G IVPB: 3.3750 g | Freq: Three times a day (TID) | INTRAVENOUS | Status: DC

## 2024-05-01 MED ORDER — PHENYLEPHRINE 80 MCG/ML (10ML) SYRINGE FOR IV PUSH (FOR BLOOD PRESSURE SUPPORT)
PREFILLED_SYRINGE | INTRAVENOUS | Status: AC
  Filled 2024-05-01: qty 10

## 2024-05-01 MED ORDER — ONDANSETRON HCL 4 MG/2ML IJ SOLN
4.0000 mg | Freq: Once | INTRAMUSCULAR | Status: AC
  Administered 2024-05-01: 4 mg via INTRAVENOUS
  Filled 2024-05-01: qty 2

## 2024-05-01 MED ORDER — DIPHENHYDRAMINE HCL 50 MG/ML IJ SOLN: 12.5000 mg | Freq: Four times a day (QID) | INTRAMUSCULAR | Status: DC | PRN

## 2024-05-01 MED ORDER — SUCCINYLCHOLINE CHLORIDE 200 MG/10ML IV SOSY
PREFILLED_SYRINGE | INTRAVENOUS | Status: DC | PRN
  Administered 2024-05-01: 100 mg via INTRAVENOUS

## 2024-05-01 MED ORDER — DIPHENHYDRAMINE HCL 12.5 MG/5ML PO ELIX: 12.5000 mg | ORAL_SOLUTION | Freq: Four times a day (QID) | ORAL | Status: DC | PRN

## 2024-05-01 MED ORDER — LACTATED RINGERS IV SOLN: INTRAVENOUS | Status: AC

## 2024-05-01 MED ORDER — AMISULPRIDE (ANTIEMETIC) 5 MG/2ML IV SOLN
10.0000 mg | Freq: Once | INTRAVENOUS | Status: DC | PRN
Start: 2024-05-01 — End: 2024-05-01

## 2024-05-01 MED ORDER — SUGAMMADEX SODIUM 200 MG/2ML IV SOLN
INTRAVENOUS | Status: DC | PRN
  Administered 2024-05-01: 200 mg via INTRAVENOUS

## 2024-05-01 MED ORDER — PHENYLEPHRINE HCL-NACL 20-0.9 MG/250ML-% IV SOLN
INTRAVENOUS | Status: DC | PRN
  Administered 2024-05-01: 80 ug/min via INTRAVENOUS

## 2024-05-01 MED ORDER — PHENYLEPHRINE 80 MCG/ML (10ML) SYRINGE FOR IV PUSH (FOR BLOOD PRESSURE SUPPORT)
PREFILLED_SYRINGE | INTRAVENOUS | Status: DC | PRN
  Administered 2024-05-01: 160 ug via INTRAVENOUS
  Administered 2024-05-01: 240 ug via INTRAVENOUS

## 2024-05-01 MED ORDER — ONDANSETRON HCL 4 MG/2ML IJ SOLN: 4.0000 mg | Freq: Once | INTRAMUSCULAR | Status: DC | PRN

## 2024-05-01 MED ORDER — ORAL CARE MOUTH RINSE: 15.0000 mL | Freq: Once | OROMUCOSAL | Status: AC

## 2024-05-01 MED ORDER — CHLORHEXIDINE GLUCONATE 0.12 % MT SOLN
15.0000 mL | Freq: Once | OROMUCOSAL | Status: AC
  Administered 2024-05-01: 15 mL via OROMUCOSAL

## 2024-05-01 MED ORDER — SUCCINYLCHOLINE CHLORIDE 200 MG/10ML IV SOSY
PREFILLED_SYRINGE | INTRAVENOUS | Status: AC
  Filled 2024-05-01: qty 10

## 2024-05-01 MED ORDER — LACTATED RINGERS IV SOLN: INTRAVENOUS | Status: DC

## 2024-05-01 MED ORDER — ROCURONIUM BROMIDE 10 MG/ML (PF) SYRINGE
PREFILLED_SYRINGE | INTRAVENOUS | Status: DC | PRN
  Administered 2024-05-01: 30 mg via INTRAVENOUS

## 2024-05-01 MED ORDER — MIDAZOLAM HCL 2 MG/2ML IJ SOLN
INTRAMUSCULAR | Status: AC
  Filled 2024-05-01: qty 2

## 2024-05-01 MED ORDER — BUPIVACAINE HCL (PF) 0.25 % IJ SOLN
INTRAMUSCULAR | Status: AC
  Filled 2024-05-01: qty 30

## 2024-05-01 MED ORDER — BUPIVACAINE HCL (PF) 0.25 % IJ SOLN
INTRAMUSCULAR | Status: DC | PRN
  Administered 2024-05-01: 6 mL

## 2024-05-01 MED ORDER — ALBUMIN HUMAN 5 % IV SOLN
12.5000 g | Freq: Once | INTRAVENOUS | Status: AC
  Administered 2024-05-01: 12.5 g via INTRAVENOUS

## 2024-05-01 MED ORDER — FENTANYL CITRATE (PF) 250 MCG/5ML IJ SOLN
INTRAMUSCULAR | Status: AC
  Filled 2024-05-01: qty 5

## 2024-05-01 MED ORDER — DEXAMETHASONE SODIUM PHOSPHATE 10 MG/ML IJ SOLN
INTRAMUSCULAR | Status: AC
  Filled 2024-05-01: qty 1

## 2024-05-01 MED ORDER — PIPERACILLIN-TAZOBACTAM 3.375 G IVPB
3.3750 g | Freq: Three times a day (TID) | INTRAVENOUS | Status: DC
  Administered 2024-05-01 – 2024-05-05 (×10): 3.375 g via INTRAVENOUS
  Filled 2024-05-01 (×12): qty 50

## 2024-05-01 MED ORDER — ONDANSETRON HCL 4 MG/2ML IJ SOLN
INTRAMUSCULAR | Status: AC
  Filled 2024-05-01: qty 2

## 2024-05-01 MED ORDER — PANTOPRAZOLE SODIUM 40 MG IV SOLR
40.0000 mg | Freq: Every day | INTRAVENOUS | Status: DC
  Administered 2024-05-01 – 2024-05-02 (×2): 40 mg via INTRAVENOUS
  Filled 2024-05-01 (×2): qty 10

## 2024-05-01 MED ORDER — ALBUMIN HUMAN 5 % IV SOLN
INTRAVENOUS | Status: AC
  Filled 2024-05-01: qty 250

## 2024-05-01 MED ORDER — MORPHINE SULFATE (PF) 4 MG/ML IV SOLN
4.0000 mg | Freq: Once | INTRAVENOUS | Status: AC
  Administered 2024-05-01: 4 mg via INTRAVENOUS
  Filled 2024-05-01: qty 1

## 2024-05-01 MED ORDER — ONDANSETRON HCL 4 MG/2ML IJ SOLN
4.0000 mg | Freq: Four times a day (QID) | INTRAMUSCULAR | Status: DC | PRN
  Administered 2024-05-02: 4 mg via INTRAVENOUS
  Filled 2024-05-01: qty 2

## 2024-05-01 MED ORDER — LIDOCAINE 2% (20 MG/ML) 5 ML SYRINGE
INTRAMUSCULAR | Status: AC
  Filled 2024-05-01: qty 5

## 2024-05-01 MED ORDER — PROPOFOL 10 MG/ML IV BOLUS
INTRAVENOUS | Status: AC
  Filled 2024-05-01: qty 20

## 2024-05-01 MED ORDER — FENTANYL CITRATE (PF) 250 MCG/5ML IJ SOLN
INTRAMUSCULAR | Status: DC | PRN
Start: 2024-05-01 — End: 2024-05-01
  Administered 2024-05-01: 100 ug via INTRAVENOUS

## 2024-05-01 MED ORDER — LIDOCAINE 2% (20 MG/ML) 5 ML SYRINGE
INTRAMUSCULAR | Status: DC | PRN
  Administered 2024-05-01: 70 mg via INTRAVENOUS

## 2024-05-01 MED ORDER — ENOXAPARIN SODIUM 40 MG/0.4ML IJ SOSY
40.0000 mg | PREFILLED_SYRINGE | INTRAMUSCULAR | Status: DC
  Administered 2024-05-02 – 2024-05-06 (×5): 40 mg via SUBCUTANEOUS
  Filled 2024-05-01 (×5): qty 0.4

## 2024-05-01 MED ORDER — ROCURONIUM BROMIDE 10 MG/ML (PF) SYRINGE
PREFILLED_SYRINGE | INTRAVENOUS | Status: AC
  Filled 2024-05-01: qty 10

## 2024-05-01 MED ORDER — HYDROMORPHONE HCL 1 MG/ML IJ SOLN
1.0000 mg | Freq: Once | INTRAMUSCULAR | Status: AC
  Administered 2024-05-01: 1 mg via INTRAVENOUS
  Filled 2024-05-01: qty 1

## 2024-05-01 MED ORDER — DEXMEDETOMIDINE HCL IN NACL 80 MCG/20ML IV SOLN
INTRAVENOUS | Status: DC | PRN
  Administered 2024-05-01: 8 ug via INTRAVENOUS

## 2024-05-01 MED ORDER — METOPROLOL TARTRATE 5 MG/5ML IV SOLN: 5.0000 mg | Freq: Four times a day (QID) | INTRAVENOUS | Status: DC | PRN

## 2024-05-01 MED ORDER — PIPERACILLIN-TAZOBACTAM 3.375 G IVPB 30 MIN
3.3750 g | Freq: Once | INTRAVENOUS | Status: AC
  Administered 2024-05-01: 3.375 g via INTRAVENOUS
  Filled 2024-05-01: qty 50

## 2024-05-01 MED ORDER — PANTOPRAZOLE SODIUM 40 MG IV SOLR
80.0000 mg | Freq: Once | INTRAVENOUS | Status: AC
  Administered 2024-05-01: 80 mg via INTRAVENOUS
  Filled 2024-05-01: qty 20

## 2024-05-01 SURGICAL SUPPLY — 45 items
BIOPATCH RED 1 DISK 7.0 (GAUZE/BANDAGES/DRESSINGS) ×1 IMPLANT
BLADE CLIPPER SURG (BLADE) IMPLANT
CANISTER SUCTION 3000ML PPV (SUCTIONS) ×2 IMPLANT
CHLORAPREP W/TINT 26 (MISCELLANEOUS) ×2 IMPLANT
COVER SURGICAL LIGHT HANDLE (MISCELLANEOUS) ×2 IMPLANT
DRAIN CHANNEL 19F RND (DRAIN) ×1 IMPLANT
DRAPE LAPAROSCOPIC ABDOMINAL (DRAPES) ×2 IMPLANT
DRAPE WARM FLUID 44X44 (DRAPES) ×1 IMPLANT
DRSG OPSITE POSTOP 4X10 (GAUZE/BANDAGES/DRESSINGS) IMPLANT
DRSG OPSITE POSTOP 4X8 (GAUZE/BANDAGES/DRESSINGS) IMPLANT
ELECT BLADE 6.5 EXT (BLADE) IMPLANT
ELECT CAUTERY BLADE 6.4 (BLADE) ×2 IMPLANT
ELECTRODE REM PT RTRN 9FT ADLT (ELECTROSURGICAL) ×2 IMPLANT
EVACUATOR SILICONE 100CC (DRAIN) ×1 IMPLANT
GLOVE BIO SURGEON STRL SZ7.5 (GLOVE) ×4 IMPLANT
GLOVE BIOGEL PI IND STRL 8 (GLOVE) ×2 IMPLANT
GOWN STRL REUS W/ TWL LRG LVL3 (GOWN DISPOSABLE) ×2 IMPLANT
GOWN STRL REUS W/ TWL XL LVL3 (GOWN DISPOSABLE) ×2 IMPLANT
GRASPER SUT TROCAR 14GX15 (MISCELLANEOUS) ×1 IMPLANT
HANDLE SUCTION POOLE (INSTRUMENTS) ×1 IMPLANT
IRRIGATION SUCT STRKRFLW 2 WTP (MISCELLANEOUS) ×1 IMPLANT
KIT BASIN OR (CUSTOM PROCEDURE TRAY) ×2 IMPLANT
KIT TURNOVER KIT B (KITS) ×2 IMPLANT
LIGASURE IMPACT 36 18CM CVD LR (INSTRUMENTS) IMPLANT
LIGASURE VESSEL 5MM BLUNT TIP (ELECTROSURGICAL) ×1 IMPLANT
NS IRRIG 1000ML POUR BTL (IV SOLUTION) ×3 IMPLANT
PACK GENERAL/GYN (CUSTOM PROCEDURE TRAY) ×2 IMPLANT
PAD ARMBOARD POSITIONER FOAM (MISCELLANEOUS) ×2 IMPLANT
PENCIL SMOKE EVACUATOR (MISCELLANEOUS) ×1 IMPLANT
SCISSORS LAP 5X35 DISP (ENDOMECHANICALS) ×1 IMPLANT
SLEEVE Z-THREAD 5X100MM (TROCAR) ×1 IMPLANT
SPECIMEN JAR LARGE (MISCELLANEOUS) IMPLANT
SPONGE T-LAP 18X18 ~~LOC~~+RFID (SPONGE) IMPLANT
STAPLER SKIN PROX 35W (STAPLE) ×1 IMPLANT
SUT ETHILON 2 0 FS 18 (SUTURE) ×1 IMPLANT
SUT PDS AB 1 TP1 54 (SUTURE) ×2 IMPLANT
SUT SILK 2 0 SH CR/8 (SUTURE) ×1 IMPLANT
SUT SILK 2 0 TIES 10X30 (SUTURE) ×1 IMPLANT
SUT SILK 3 0 SH CR/8 (SUTURE) ×1 IMPLANT
SUT SILK 3 0 TIES 10X30 (SUTURE) ×1 IMPLANT
TOWEL GREEN STERILE (TOWEL DISPOSABLE) ×2 IMPLANT
TRAY FOLEY MTR SLVR 16FR STAT (SET/KITS/TRAYS/PACK) ×2 IMPLANT
TROCAR 11X100 Z THREAD (TROCAR) ×1 IMPLANT
TROCAR Z-THREAD OPTICAL 5X100M (TROCAR) ×1 IMPLANT
YANKAUER SUCT BULB TIP NO VENT (SUCTIONS) IMPLANT

## 2024-05-01 NOTE — Transfer of Care (Signed)
 Immediate Anesthesia Transfer of Care Note  Patient: Leslie Reed  Procedure(s) Performed: LAPAROSCOPY, DIAGNOSTIC  Patient Location: PACU  Anesthesia Type:General  Level of Consciousness: awake, alert , and oriented  Airway & Oxygen Therapy: Patient Spontanous Breathing and Patient connected to nasal cannula oxygen  Post-op Assessment: Report given to RN and Post -op Vital signs reviewed and stable  Post vital signs: Reviewed and stable  Last Vitals:  Vitals Value Taken Time  BP 90/46 05/01/24 1400  Temp    Pulse 83 05/01/24 1400  Resp 16 05/01/24 1400  SpO2 97 % 05/01/24 1400  Vitals shown include unfiled device data.  Last Pain:  Vitals:   05/01/24 1141  TempSrc:   PainSc: 2          Complications: No notable events documented.

## 2024-05-01 NOTE — Anesthesia Preprocedure Evaluation (Addendum)
 Anesthesia Evaluation  Patient identified by MRN, date of birth, ID band Patient awake    Reviewed: Allergy & Precautions, NPO status , Patient's Chart, lab work & pertinent test results  Airway Mallampati: II  TM Distance: >3 FB     Dental no notable dental hx. (+) Teeth Intact, Dental Advisory Given, Caps   Pulmonary asthma , sleep apnea    Pulmonary exam normal breath sounds clear to auscultation       Cardiovascular hypertension, Pt. on medications Normal cardiovascular exam Rhythm:Regular Rate:Normal     Neuro/Psych  Headaches Hx/o thoracic outlet syndrome  Neuromuscular disease  negative psych ROS   GI/Hepatic Neg liver ROS,GERD  Medicated,,Hx/o diverticulosis IBS Perforated duodenal ulcer   Endo/Other  negative endocrine ROS    Renal/GU negative Renal ROS  negative genitourinary   Musculoskeletal  (+) Arthritis , Osteoarthritis,    Abdominal  (+)  Abdomen: tender.   Peds  Hematology negative hematology ROS (+)   Anesthesia Other Findings   Reproductive/Obstetrics                             Anesthesia Physical Anesthesia Plan  ASA: 2  Anesthesia Plan: General   Post-op Pain Management: Dilaudid IV, Precedex and Ofirmev IV (intra-op)*   Induction: Intravenous, Cricoid pressure planned and Rapid sequence  PONV Risk Score and Plan: 4 or greater and Treatment may vary due to age or medical condition and Ondansetron  Airway Management Planned: Oral ETT  Additional Equipment: None  Intra-op Plan:   Post-operative Plan: Extubation in OR  Informed Consent: I have reviewed the patients History and Physical, chart, labs and discussed the procedure including the risks, benefits and alternatives for the proposed anesthesia with the patient or authorized representative who has indicated his/her understanding and acceptance.     Dental advisory given  Plan Discussed with:  Anesthesiologist and CRNA  Anesthesia Plan Comments:         Anesthesia Quick Evaluation

## 2024-05-01 NOTE — Anesthesia Procedure Notes (Signed)
 Procedure Name: Intubation Date/Time: 05/01/2024 12:34 PM  Performed by: Artemisa Bile D, CRNAPre-anesthesia Checklist: Patient identified, Emergency Drugs available, Suction available and Patient being monitored Patient Re-evaluated:Patient Re-evaluated prior to induction Oxygen Delivery Method: Circle System Utilized Preoxygenation: Pre-oxygenation with 100% oxygen Induction Type: IV induction and Rapid sequence Laryngoscope Size: Miller and 2 Grade View: Grade II Tube type: Oral Tube size: 7.0 mm Number of attempts: 1 Airway Equipment and Method: Stylet and Oral airway Placement Confirmation: ETT inserted through vocal cords under direct vision, positive ETCO2 and breath sounds checked- equal and bilateral Secured at: 21 cm Tube secured with: Tape Dental Injury: Teeth and Oropharynx as per pre-operative assessment

## 2024-05-01 NOTE — Op Note (Signed)
 05/01/2024  1:42 PM  PATIENT:  Leslie Reed  78 y.o. female  PRE-OPERATIVE DIAGNOSIS:  perforated duodenal ulcer  POST-OPERATIVE DIAGNOSIS:  multiple bowel polyps, duodenal inflammation  PROCEDURE:  Procedure(s): LAPAROSCOPY, DIAGNOSTIC and drain placement  SURGEON:  Surgeons and Role:    Shela Derby, MD - Primary  ASSISTANTS: none   ANESTHESIA:   local and general  EBL:  minimal   BLOOD ADMINISTERED:none  DRAINS: (19Fr) Jackson-Pratt drain(s) with closed bulb suction in the paraduodenal area   LOCAL MEDICATIONS USED:  BUPIVICAINE   SPECIMEN:  No Specimen  DISPOSITION OF SPECIMEN:  N/A  COUNTS:  YES  TOURNIQUET:  * No tourniquets in log *  DICTATION: .Dragon Dictation Indicated procedure: Patient is a 78 year old female with epigastric abdominal pain.  Patient was seen in the ER secondary to continued pain and peritonitis.  Patient underwent CT scan was found to have inflammation of the duodenum and free air that was consistent with perforated peptic ulcer.  Patient continue with peritonitis was transferred [facility].  She was consulted and underwent diagnostic laparoscopy for possible repair of peptic ulcer perforation.  Findings: Patient did not have any perforation or succus in the abdominal cavity there is no gastric contents.  There was some inflammation of the duodenum.  I did mobilize some however did not fully kocherized this.  There was no perforation that was seen.  I did visualize the lesser sac without any gastric contents.  I was able to visualize the stomach up towards the GE junction without any pathology findings.  I was able to run the small bowel from the ligament of Treitz and did fine a large diverticulum in the mesenteric side that was approximately 10 cm from ligament of Treitz.  Upon continuing to run the bowel distally from here I did find several small diverticuli that were seen.  There was no signs of infection or perforation from  any of these.  Secondary to this I decided to proceed with just placing a drain in the right upper quadrant as this was the area of pathology on CT scan.  The sigmoid colon looked normal and without inflammation.  Details of procedure: As the patient was consented she was taken back to the OR urgently and underwent general endotracheal intubation.  She was then prepped and draped in standard fashion.  Timeout was called and all facts verified.  A Veress needle technique was used insufflate the abdomen in the right lower quadrant.  Subsequent to this 5mm trocar and camera placed intra-abdominal.  There was no injury to any intra-abdominal organs.  At this time an 11 mm trocar was placed in the infraumbilical midline as well as a 5 mm trocar in the left lower quadrant.  At this time the patient was position.  I was able to evaluate the antrum of the stomach.  There was no signs of gastric contents or succus seen.  At this time I was mobilizing the paraduodenal fat away from the gastric fossa.  I did gently dissect away the the lateral connection to the C-loop.  There was no signs of succus.  There is no bile seen.  There was some inflammation.  There is no perforation seen.  I then was able to incise the greater curvature the stomach and was able to enter the lesser sac.  There is no gastric contents there.  I was able to visualize the anterior portion of the stomach up towards the GE junction.  There is  no pathology seen.  I was able to place the patient in Trendelenburg.  I visualized the pelvis.  There is no ascites seen.  The sigmoid colon appeared to be normal without any thickening.  At this time I began running the small bowel from ligament of Treitz distally.  There was 1 large [place] diverticulum that was proximal 10 cm from the ligament of Treitz.  I proceeded to run the small bowel distally.  There is multiple small diverticuli that were seen.  None of these appeared to be inflamed.  At this  time I proceeded with just placing a drain in the peritoneal space.  This brought out the  right lower quadrant area.  A 19 French drain was placed.  A 2-0 nylon was placed to secure the stitch to the skin.  The 11 mm trocar was reapproximated using a 0 Vicryl and a PMI suture passer.  All trocars were removed.  Insufflation was evacuated.  Skin was dressed with 4-0 Monocryl and dressed with Dermabond.  Patient tolerated the procedure well was taken to the recovery in stable condition.   PLAN OF CARE: Admit for overnight observation  PATIENT DISPOSITION:  PACU - hemodynamically stable.   Delay start of Pharmacological VTE agent (>24hrs) due to surgical blood loss or risk of bleeding: yes

## 2024-05-01 NOTE — Plan of Care (Signed)

## 2024-05-01 NOTE — ED Triage Notes (Signed)
 Pt coming from home via POV after waking up with pain in the epigastric area that wraps around the right side and into the back. Pt states she took antacids, ibuprofen, and Voltaren  gel with brief relief but then had rebound pain.

## 2024-05-01 NOTE — ED Provider Notes (Signed)
 MSE was initiated and I personally evaluated the patient and placed orders (if any) at  6:56 AM on May 01, 2024.  The patient appears stable so that the remainder of the MSE may be completed by another provider.  78 year old female with history of hyperlipidemia, asthma, GERD comes in with severe upper abdominal pain radiating to the back which started about 2 AM.  It has waxed and waned through the night.  There has been nausea but no vomiting.  On exam, she has severe epigastric and right upper quadrant tenderness.  She is status postcholecystectomy.  I have ordered with appropriate labs and CT scan of abdomen and pelvis.  ECG shows no acute changes.   Alissa April, MD 05/01/24 [phone]

## 2024-05-01 NOTE — H&P (Signed)
 H&P Note  SYMPHANI ECKSTROM December 07, 1945  [id number].    Requesting MD: Deatra Face, MD Chief Complaint/Reason for Consult: Duodenal ulcer with likely perforation HPI:  Patient is a 78 year old female who presented to [facility] with abdominal pain that woke her up around 12:30 this AM. Pain was in the RUQ and radiating to the back and severe. She tried getting up and changing position and was able to get back to sleep around 3 AM. Woke up around 6:30 again with severe recurrent pain. She tried taking some advil around 12:30 and putting some voltaren  gel on her back without relief. Associated nausea for which she took some antacids but pain and nausea were not improved overall. No prior hx of PUD, hx of reflux. Recently finished course of amoxicillin  per PCP and was started on medication for GERD. Does not use NSAIDs heavily, denies tobacco or illicit drug use, no recent steroids. No GI malignancy in her history. She has had several prior abdominal surgeries including cholecystectomy, appendectomy and cesarean section x4. Not on blood thinners and NKDA. Her son is at bedside with her in the pre-op area.   ROS: Negative other than HPI  Family History  Problem Relation Age of Onset   Diabetes Mother    Pulmonary fibrosis Mother        25 deceased   Hypertension Mother    Hypertension Father    Alzheimer's disease Father    Kidney failure Father    Diabetes Sister    Hypertension Brother    Sleep apnea Brother    Diabetes Maternal Grandmother    Colon cancer Maternal Grandfather    Colon polyps Daughter    Thyroid  disease Daughter    Psoriasis Daughter    Thyroid  disease Daughter    Diabetes Son    Allergy (severe) Son    Depression Son    Esophageal cancer Neg Hx    Rectal cancer Neg Hx    Stomach cancer Neg Hx     Past Medical History:  Diagnosis Date   Allergic rhinitis    Allergy    Arthritis    hands, lower back   Asthma    Bronchitis    uses inhaler prn    Cataract    just being monitored - no surgery   Colitis 8/04   Diverticulitis    GERD (gastroesophageal reflux disease)    diet controlled, no meds   History of migraine headaches    Hx of colonic polyp    Hx of varicella    Hyperlipidemia    Insomnia    Interstitial cystitis 10/90   Mitral valve prolapse    Osteopenia    Scoliosis 2005   Sleep apnea    Tick bite 2014   STARI-antibiotics x 5 weeks   Tracheobronchopathia-osteochondroplastica     Past Surgical History:  Procedure Laterality Date   ABDOMINAL HYSTERECTOMY     partial  for pain no cancer     ADENOIDECTOMY     APPENDECTOMY     BREAST BIOPSY Right    x 2    BRONCHIAL WASHINGS  04/22/2020   Procedure: BRONCHIAL lavage;  Surgeon: Joesph Mussel, DO;  Location: [facility];  Service: Endoscopy;;   CESAREAN SECTION     x4   CHOLECYSTECTOMY     colon polyps     COLONOSCOPY  09/09/2016   Dr.Nandigam   DILATION AND CURETTAGE OF UTERUS     x 2  TONSILLECTOMY     VIDEO BRONCHOSCOPY N/A 04/22/2020   Procedure: VIDEO BRONCHOSCOPY WITHOUT FLUORO;  Surgeon: Joesph Mussel, DO;  Location: [facility];  Service: Endoscopy;  Laterality: N/A;   WISDOM TOOTH EXTRACTION      Social History:  reports that she has never smoked. She has been exposed to tobacco smoke. She has never used smokeless tobacco. She reports that she does not currently use alcohol. She reports that she does not use drugs.  Allergies: No Known Allergies  Facility-Administered Medications Prior to Admission  Medication Dose Route Frequency Provider Last Rate Last Admin   0.9 %  sodium chloride  infusion  500 mL Intravenous Continuous Nandigam, Kavitha V, MD       Medications Prior to Admission  Medication Sig Dispense Refill   amoxicillin -clavulanate (AUGMENTIN ) 875-125 MG tablet Take 1 tablet by mouth 2 (two) times daily. 20 tablet 0   Cholecalciferol (VITAMIN D3) 50 MCG (2000 UT) capsule Take 2 capsules (4,000 Units total) by mouth daily. 60  capsule 3   cyanocobalamin  (VITAMIN B12) 1000 MCG tablet Take 1,000 mcg by mouth daily.     meloxicam  (MOBIC ) 15 MG tablet Take 1 tablet (15 mg total) by mouth daily. Take for 1 week, then as needed. (Patient not taking: Reported on 04/26/2024) 30 tablet 3   Minoxidil (ROGAINE EX) Apply topically.     olmesartan (BENICAR) 5 MG tablet Take 5 mg by mouth daily.     Olopatadine-Mometasone (RYALTRIS ) 665-25 MCG/ACT SUSP Place 1-2 sprays into the nose in the morning and at bedtime. 29 g 5   omeprazole (PRILOSEC) 20 MG capsule Take 20 mg by mouth daily.     Respiratory Therapy Supplies (FLUTTER) DEVI 1 each by Does not apply route 2 (two) times daily. (Patient not taking: Reported on 04/18/2024) 1 each 0    Blood pressure 118/66, pulse 95, temperature 98 F (36.7 C), temperature source Oral, resp. rate 18, height 5\' 4"  (1.626 m), weight 64 kg, last menstrual period 11/29/1986, SpO2 93%. Physical Exam:  General: pleasant, WD, WN female who is laying in bed in NAD HEENT: head is normocephalic, atraumatic.  Sclera are anicteric, EOMI Heart: regular, rate, and rhythm.   Palpable radial and pedal pulses bilaterally Lungs: CTAB, no wheezes, rhonchi, or rales noted.  Respiratory effort nonlabored Abd: soft, TTP in RUQ and epigastric abdomen with guarding and peritonitis, ND, no masses, hernias, or organomegaly MS: all 4 extremities are symmetrical with no cyanosis, clubbing, or edema. Skin: warm and dry with no masses, lesions, or rashes Neuro: Cranial nerves 2-12 grossly intact, sensation is normal throughout Psych: A&Ox3 with an appropriate affect.   Results for orders placed or performed during the hospital encounter of 05/01/24 (from the past 48 hours)  Comprehensive metabolic panel     Status: Abnormal   Collection Time: 05/01/24  7:01 AM  Result Value Ref Range   Sodium 141 135 - 145 mmol/L   Potassium 4.3 3.5 - 5.1 mmol/L    Comment: HEMOLYSIS AT THIS LEVEL MAY AFFECT RESULT   Chloride 106 98  - 111 mmol/L   CO2 22 22 - 32 mmol/L   Glucose, Bld 83 70 - 99 mg/dL    Comment: Glucose reference range applies only to samples taken after fasting for at least 8 hours.   BUN 26 (H) 8 - 23 mg/dL   Creatinine, Ser 0.98 0.44 - 1.00 mg/dL   Calcium  9.6 8.9 - 10.3 mg/dL   Total Protein 6.1 (L) 6.5 - 8.1  g/dL   Albumin 4.0 3.5 - 5.0 g/dL   AST 19 15 - 41 U/L    Comment: HEMOLYSIS AT THIS LEVEL MAY AFFECT RESULT   ALT 13 0 - 44 U/L   Alkaline Phosphatase 58 38 - 126 U/L   Total Bilirubin 0.5 0.0 - 1.2 mg/dL   GFR, Estimated >16 >10 mL/min    Comment: (NOTE) Calculated using the CKD-EPI Creatinine Equation (2021)    Anion gap 13 5 - 15    Comment: Performed at [facility], [address], [place], [place] [address]  CBC with Differential     Status: Abnormal   Collection Time: 05/01/24  7:01 AM  Result Value Ref Range   WBC 8.3 4.0 - 10.5 K/uL   RBC 4.16 3.87 - 5.11 MIL/uL   Hemoglobin 12.7 12.0 - 15.0 g/dL   HCT 40.9 81.1 - 91.4 %   MCV 93.0 80.0 - 100.0 fL   MCH 30.5 26.0 - 34.0 pg   MCHC 32.8 30.0 - 36.0 g/dL   RDW 78.2 95.6 - 21.3 %   Platelets 108 (L) 150 - 400 K/uL    Comment: Immature Platelet Fraction may be clinically indicated, consider ordering this additional test [id number]    nRBC 0.0 0.0 - 0.2 %   Neutrophils Relative % 67 %   Neutro Abs 5.6 1.7 - 7.7 K/uL   Lymphocytes Relative 22 %   Lymphs Abs 1.8 0.7 - 4.0 K/uL   Monocytes Relative 9 %   Monocytes Absolute 0.7 0.1 - 1.0 K/uL   Eosinophils Relative 1 %   Eosinophils Absolute 0.1 0.0 - 0.5 K/uL   Basophils Relative 1 %   Basophils Absolute 0.0 0.0 - 0.1 K/uL   RBC Morphology MORPHOLOGY UNREMARKABLE    Smear Review PLATELETS APPEAR ADEQUATE     Comment: PLATELET COUNT CONFIRMED BY SMEAR   Immature Granulocytes 0 %   Abs Immature Granulocytes 0.01 0.00 - 0.07 K/uL    Comment: Performed at [facility], [address] [place], [place] [address]  Lipase, blood      Status: None   Collection Time: 05/01/24  7:01 AM  Result Value Ref Range   Lipase 17 11 - 51 U/L    Comment: Performed at [facility], [address] [place], [place] [address]  Troponin T, High Sensitivity     Status: None   Collection Time: 05/01/24  7:01 AM  Result Value Ref Range   Troponin T High Sensitivity <15 <19 ng/L    Comment: (NOTE) Biotin concentrations > 1000 ng/mL falsely decrease TnT results.  Serial cardiac troponin measurements are suggested.  Refer to the Links section for chest pain algorithms and additional  guidance. Performed at [facility], [address] [place], [place] [address]   Urinalysis, Routine w reflex microscopic -Urine, Clean Catch     Status: Abnormal   Collection Time: 05/01/24  7:12 AM  Result Value Ref Range   Color, Urine YELLOW YELLOW   APPearance CLEAR CLEAR   Specific Gravity, Urine 1.028 1.005 - 1.030   pH 6.5 5.0 - 8.0   Glucose, UA NEGATIVE NEGATIVE mg/dL   Hgb urine dipstick NEGATIVE NEGATIVE   Bilirubin Urine NEGATIVE NEGATIVE   Ketones, ur NEGATIVE NEGATIVE mg/dL   Protein, ur TRACE (A) NEGATIVE mg/dL   Nitrite NEGATIVE NEGATIVE   Leukocytes,Ua SMALL (A) NEGATIVE   RBC / HPF 0-5 0 - 5 RBC/hpf   WBC, UA 0-5 0 - 5 WBC/hpf  Bacteria, UA NONE SEEN NONE SEEN   Squamous Epithelial / HPF 0-5 0 - 5 /HPF   Mucus PRESENT    Amorphous Crystal PRESENT     Comment: Performed at [facility], [address], [place], [place] [address]  Troponin T, High Sensitivity     Status: None   Collection Time: 05/01/24  9:54 AM  Result Value Ref Range   Troponin T High Sensitivity <15 <19 ng/L    Comment: (NOTE) Biotin concentrations > 1000 ng/mL falsely decrease TnT results.  Serial cardiac troponin measurements are suggested.  Refer to the Links section for chest pain algorithms and additional  guidance. Performed at [facility], [address], [place], [place] [address]    CT ABDOMEN PELVIS W CONTRAST Result Date: 05/01/2024 CLINICAL DATA:  Epigastric and right upper abdominal pain radiating to the back EXAM: CT ABDOMEN AND PELVIS WITH CONTRAST TECHNIQUE: Multidetector CT imaging of the abdomen and pelvis was performed using the standard protocol following bolus administration of intravenous contrast. RADIATION DOSE REDUCTION: This exam was performed according to the departmental dose-optimization program which includes automated exposure control, adjustment of the mA and/or kV according to patient size and/or use of iterative reconstruction technique. CONTRAST:  100mL OMNIPAQUE IOHEXOL 300 MG/ML  SOLN COMPARISON:  None Available. FINDINGS: Lower chest: No focal consolidation or pulmonary nodule in the lung bases. No pleural effusion or pneumothorax demonstrated. Partially imaged heart size is normal. Hepatobiliary: No focal hepatic lesions. Moderate intrahepatic bile duct dilation, likely secondary to cholecystectomy. Pancreas: No focal lesions or main ductal dilation. Spleen: Normal in size without focal abnormality. Adrenals/Urinary Tract: No adrenal nodules. No suspicious renal mass, calculi or hydronephrosis. Mild proximal right ureteral enhancement, likely reactive. No focal bladder wall thickening. Stomach/Bowel: Normal appearance of the stomach. Irregular free air and small volume free fluid posterior to the proximal duodenum. Multiple adjacent gas and fluid-filled diverticuli arising from the duodenum. Colonic diverticulosis without acute diverticulitis. Appendix is not discretely seen. Vascular/Lymphatic: Aortic atherosclerosis. No enlarged abdominal or pelvic lymph nodes. Reproductive: No adnexal masses. Other: No fluid collection. Musculoskeletal: No acute or abnormal lytic or blastic osseous lesions. Multilevel degenerative changes of the partially imaged thoracic and lumbar spine. IMPRESSION: 1. Irregular free air and small volume  free fluid posterior to the proximal duodenum, suspicious for duodenal perforation, likely in the setting of peptic ulcer. 2. Colonic diverticulosis without acute diverticulitis. 3.  Aortic Atherosclerosis (ICD10-I70.0). Critical Value/emergent results were called by telephone at the time of interpretation on 05/01/2024 at 9:09 am to Deatra Face, MD, who verbally acknowledged these results. Electronically Signed   By: Limin  Xu M.D.   On: 05/01/2024 09:17   DG Chest Port 1 View Result Date: 05/01/2024 EXAM: 1 VIEW XRAY OF THE CHEST 05/01/2024 07:51:40 AM COMPARISON: 12/21/2016 CLINICAL HISTORY: Rule out free air. Pt coming from home via POV after waking up with pain in the epigastric area that wraps around the right side and into the back. Pt states she took antacids, ibuprofen, and Voltaren  gel with brief relief but then had rebound pain. FINDINGS: LUNGS AND PLEURA: No focal pulmonary opacity. No pulmonary edema. No pleural effusion. No pneumothorax. 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 HEART AND MEDIASTINUM: Mild cardiac enlargement is present. No acute abnormality of the mediastinal silhouette. BONES AND SOFT TISSUES: No acute osseous abnormality. No free air is present under the hemidiaphragms. IMPRESSION: 1. No free air under the hemidiaphragms. 2. No acute cardiopulmonary pathology. 3. Changes consistent with COPD. Electronically  signed by: Audree Leas MD 05/01/2024 08:05 AM EDT RP Workstation: [id number]      Assessment/Plan Perforated duodenal ulcer  - CT with irregular free air and small volume free fluid posterior to proximal duodenum, suspicious for perforation  - Afebrile and HD stable but with peritonitis on exam despite IV pain meds - recommend proceeding to OR for diagnostic laparoscopy, possible laparotomy, possible graham patch, possible bowel resection  - discussed with patient and her son at the bedside. Admit to inpatient post-operatively   FEN: NPO, IVF VTE: SCDs,  [facility] post op ID: [email or web address] given at 0953    I reviewed ED provider notes, last 24 h vitals and pain scores, last 48 h intake and output, last 24 h labs and trends, and last 24 h imaging results.  This care required high  level of medical decision making.   Annetta Killian, [facility] 05/01/2024, 11:48 AM Please see Amion for pager number during day hours 7:00am-4:30pm

## 2024-05-01 NOTE — ED Provider Notes (Signed)
 [place] EMERGENCY DEPARTMENT AT [facility] Provider Note   CSN: [id number] Arrival date & time: 05/01/24  4010     History  Chief Complaint  Patient presents with   Abdominal Pain    Leslie Reed is a 78 y.o. female.  HPI     78 year old female comes in with chief complaint of abdominal pain. Patient has history of IBS, esophagitis.  Patient comes in with severe abdominal pain morning. Patient states that she woke up around 12:30 AM because of severe abdominal pain.  Pain is in the right upper quadrant and radiates back.  She was unable to get herself comfortable.  She laid on recliner for a little bit, and several minutes later her pain started resolving.  She went to sleep again at 3 AM, but woke up at 630 with excruciating pain again.  Pain is described as sharp pain.  Sometimes she feels like her pain starts in the back.  She did involved in heavy cleaning recently, and states that her son was examining her back and noted that she had big nodules that are painful with palpation.  Patient is status post cholecystectomy.  She has no other abdominal surgical history, no cancer history.  She states that she takes as needed meloxicam , but in general does not use NSAIDs regularly.  She has GERD history, but it was diagnosed recently, when she noted that she was having regurgitation with foul smell.  Home Medications Prior to Admission medications   Medication Sig Start Date End Date Taking? Authorizing Provider  amoxicillin -clavulanate (AUGMENTIN ) 875-125 MG tablet Take 1 tablet by mouth 2 (two) times daily. 04/18/24   Trudy Fusi, DO  Cholecalciferol (VITAMIN D3) 50 MCG (2000 UT) capsule Take 2 capsules (4,000 Units total) by mouth daily. 02/07/24   Panosh, Wanda K, MD  cyanocobalamin  (VITAMIN B12) 1000 MCG tablet Take 1,000 mcg by mouth daily.    [provider]  ipratropium (ATROVENT ) 0.03 % nasal spray Place 1-2 sprays into both nostrils 2 (two) times  daily as needed (nasal drainage). Patient not taking: Reported on 04/26/2024 09/06/22   Trudy Fusi, DO  meloxicam  (MOBIC ) 15 MG tablet Take 1 tablet (15 mg total) by mouth daily. Take for 1 week, then as needed. Patient not taking: Reported on 04/26/2024 02/09/24   Glorine Laroche, MD  Minoxidil (ROGAINE EX) Apply topically.    [provider]  olmesartan (BENICAR) 5 MG tablet Take 5 mg by mouth daily. 04/17/24   [provider]  Olopatadine-Mometasone (RYALTRIS ) 665-25 MCG/ACT SUSP Place 1-2 sprays into the nose in the morning and at bedtime. 04/18/24   Trudy Fusi, DO  omeprazole (PRILOSEC) 20 MG capsule Take 20 mg by mouth daily. 04/17/24   [provider]  Respiratory Therapy Supplies (FLUTTER) DEVI 1 each by Does not apply route 2 (two) times daily. Patient not taking: Reported on 04/18/2024 04/01/20   Joesph Mussel, DO      Allergies    Patient has no known allergies.    Review of Systems   Review of Systems  All other systems reviewed and are negative.   Physical Exam Updated Vital Signs BP (!) 145/78   Pulse 75   Temp 98.1 F (36.7 C) (Oral)   Resp 17   Ht 5\' 4"  (1.626 m)   Wt 63.5 kg   LMP 11/29/1986   SpO2 98%   BMI 24.03 kg/m  Physical Exam Vitals and nursing note reviewed.  Constitutional:  Appearance: She is well-developed.  HENT:     Head: Atraumatic.  Cardiovascular:     Rate and Rhythm: Normal rate.  Pulmonary:     Effort: Pulmonary effort is normal.  Abdominal:     Tenderness: There is abdominal tenderness in the right upper quadrant and epigastric area. There is guarding. There is no rebound.  Musculoskeletal:     Cervical back: Normal range of motion and neck supple.  Skin:    General: Skin is warm and dry.     Comments: Patient has palpable and tender nodules in the paraspinal region on the right side  Neurological:     Mental Status: She is alert and oriented to person, place, and time.     ED Results / Procedures /  Treatments   Labs (all labs ordered are listed, but only abnormal results are displayed) Labs Reviewed  COMPREHENSIVE METABOLIC PANEL WITH GFR - Abnormal; Notable for the following components:      Result Value   BUN 26 (*)    Total Protein 6.1 (*)    All other components within normal limits  CBC WITH DIFFERENTIAL/PLATELET - Abnormal; Notable for the following components:   Platelets 108 (*)    All other components within normal limits  URINALYSIS, ROUTINE W REFLEX MICROSCOPIC - Abnormal; Notable for the following components:   Protein, ur TRACE (*)    Leukocytes,Ua SMALL (*)    All other components within normal limits  LIPASE, BLOOD  TROPONIN T, HIGH SENSITIVITY  TROPONIN T, HIGH SENSITIVITY    EKG EKG Interpretation Date/Time:  Tuesday May 01 2024 06:47:30 EDT Ventricular Rate:  73 PR Interval:  164 QRS Duration:  101 QT Interval:  427 QTC Calculation: 471 R Axis:   81  Text Interpretation: Sinus rhythm Borderline right axis deviation Minimal ST depression, diffuse leads When compared with ECG of 02/29/2008, No significant change was found Confirmed by Alissa April ([id number]) on 05/01/2024 6:49:40 AM  Radiology DG Chest Port 1 View Result Date: 05/01/2024 EXAM: 1 VIEW XRAY OF THE CHEST 05/01/2024 07:51:40 AM COMPARISON: 12/21/2016 CLINICAL HISTORY: Rule out free air. Pt coming from home via POV after waking up with pain in the epigastric area that wraps around the right side and into the back. Pt states she took antacids, ibuprofen, and Voltaren  gel with brief relief but then had rebound pain. FINDINGS: LUNGS AND PLEURA: No focal pulmonary opacity. No pulmonary edema. No pleural effusion. No pneumothorax. 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 HEART AND MEDIASTINUM: Mild cardiac enlargement is present. No acute abnormality of the mediastinal silhouette. BONES AND SOFT TISSUES: No acute osseous abnormality. No free air is present under the hemidiaphragms. IMPRESSION: 1. No free air  under the hemidiaphragms. 2. No acute cardiopulmonary pathology. 3. Changes consistent with COPD. Electronically signed by: Audree Leas MD 05/01/2024 08:05 AM EDT RP Workstation: [id number]    Procedures .Critical Care  Performed by: Deatra Face, MD Authorized by: Deatra Face, MD   Critical care provider statement:    Critical care time (minutes):  52   Critical care was necessary to treat or prevent imminent or life-threatening deterioration of the following conditions: Perforated ulcer, emergent surgery needed.   Critical care was time spent personally by me on the following activities:  Development of treatment plan with patient or surrogate, discussions with consultants, evaluation of patient's response to treatment, examination of patient, ordering and review of laboratory studies, ordering and review of radiographic studies, ordering and performing treatments and interventions, pulse oximetry,  re-evaluation of patient's condition, review of old charts and obtaining history from patient or surrogate     Medications Ordered in ED Medications  pantoprazole (PROTONIX) injection 80 mg (has no administration in time range)  morphine (PF) 4 MG/ML injection 4 mg (4 mg Intravenous Given 05/01/24 0709)  ondansetron (ZOFRAN) injection 4 mg (4 mg Intravenous Given 05/01/24 0709)  iohexol (OMNIPAQUE) 300 MG/ML solution 100 mL (100 mLs Intravenous Contrast Given 05/01/24 6440)    ED Course/ Medical Decision Making/ A&P                                 Medical Decision Making Amount and/or Complexity of Data Reviewed Labs: ordered. Radiology: ordered.  Risk Prescription drug management. Decision regarding hospitalization.   This patient presents to the ED with chief complaint(s) of severe abdominal pain with pertinent past medical history of esophagitis, cholecystectomy.The complaint involves an extensive differential diagnosis and also carries with it a high risk of  complications and morbidity.    The differential diagnosis includes : Pancreatitis, Hepatobiliary pathology including cholelithiasis and cholecystitis, Gastritis/peptic ulcer disease, small bowel obstruction, Acute coronary syndrome, Aortic Dissection.  Although patient suspects that her pain primarily is musculoskeletal, and she has tender nodules, I do not think that likely is the cause for her pain.  The initial plan is to proceed with the orders with that were placed by my colleague.  Basic labs, CT abdomen pelvis already ordered.  Lipase ordered.   Additional history obtained: Records reviewed previous scopes, medications.  Patient is not on any blood thinners.  No recent upper endoscopy.  Independent labs interpretation:  The following labs were independently interpreted: BC, robotic profile, lipase is normal.  Independent visualization and interpretation of imaging: - I independently visualized the following imaging with scope of interpretation limited to determining acute life threatening conditions related to emergency care: X-ray of the chest, which revealed no clear evidence of free air.  I received a call from radiologist after 9 AM, indicating that patient likely has perforated duodenal ulcer.  Zosyn has been ordered.  Treatment and Reassessment: Patient reassessed.  Indicates that her pain is not as bad as when she first came, but is still pretty severe.  She will receive IV Dilaudid.  She is complaining of headache, IV Reglan given for that.  She has been n.p.o. since at least 3 AM.  Consultation: - Consulted or discussed management/test interpretation with external professional: General Surgery.  Spoke with [facility], she will accept the patient to [facility] preop area.  Patient made aware of the diagnosis and we have discussed with her the plan for her to be transferred.   Final Clinical Impression(s) / ED Diagnoses Final diagnoses:  Perforated duodenal ulcer (HCC)     Rx / DC Orders ED Discharge Orders     None         Deatra Face, MD 05/01/24 0945

## 2024-05-02 ENCOUNTER — Inpatient Hospital Stay (HOSPITAL_COMMUNITY)

## 2024-05-02 ENCOUNTER — Encounter (HOSPITAL_COMMUNITY): Payer: Self-pay | Admitting: General Surgery

## 2024-05-02 LAB — CBC
HCT: 33.4 % — ABNORMAL LOW (ref 36.0–46.0)
Hemoglobin: 11.1 g/dL — ABNORMAL LOW (ref 12.0–15.0)
MCH: 30.3 pg (ref 26.0–34.0)
MCHC: 33.2 g/dL (ref 30.0–36.0)
MCV: 91.3 fL (ref 80.0–100.0)
Platelets: 216 10*3/uL (ref 150–400)
RBC: 3.66 MIL/uL — ABNORMAL LOW (ref 3.87–5.11)
RDW: 13.9 % (ref 11.5–15.5)
WBC: 16 10*3/uL — ABNORMAL HIGH (ref 4.0–10.5)
nRBC: 0 % (ref 0.0–0.2)

## 2024-05-02 LAB — BASIC METABOLIC PANEL WITH GFR
Anion gap: 9 (ref 5–15)
BUN: 23 mg/dL (ref 8–23)
CO2: 24 mmol/L (ref 22–32)
Calcium: 8.6 mg/dL — ABNORMAL LOW (ref 8.9–10.3)
Chloride: 105 mmol/L (ref 98–111)
Creatinine, Ser: 0.93 mg/dL (ref 0.44–1.00)
GFR, Estimated: >60 mL/min (ref >60)
Glucose, Bld: 140 mg/dL — ABNORMAL HIGH (ref 70–99)
Potassium: 4.1 mmol/L (ref 3.5–5.1)
Sodium: 138 mmol/L (ref 135–145)

## 2024-05-02 MED ORDER — METHOCARBAMOL 500 MG PO TABS
500.0000 mg | ORAL_TABLET | Freq: Four times a day (QID) | ORAL | Status: DC | PRN
  Administered 2024-05-02 – 2024-05-04 (×4): 500 mg via ORAL
  Filled 2024-05-02 (×4): qty 1

## 2024-05-02 MED ORDER — IOHEXOL 300 MG/ML  SOLN
100.0000 mL | Freq: Once | INTRAMUSCULAR | Status: AC | PRN
  Administered 2024-05-02: 100 mL via ORAL

## 2024-05-02 NOTE — Progress Notes (Signed)
 1 Day Post-Op   Subjective/Chief Complaint:  PT doin gwell this AM Min pain D/w pt surgery findings Objective: Vital signs in last 24 hours: Temp:  [97.5 F (36.4 C)-100.1 F (37.8 C)] 97.8 F (36.6 C) (06/04 0425) Pulse Rate:  [57-95] 70 (06/04 0425) Resp:  [8-23] 16 (06/03 1935) BP: (83-123)/(46-80) 94/62 (06/04 0425) SpO2:  [92 %-97 %] 94 % (06/04 0425) Weight:  [64 kg] 64 kg (06/03 1118) Last BM Date : 04/30/24 (pta)  Intake/Output from previous day: 06/03 0701 - 06/04 0700 In: 477.3 [I.V.:151.9; IV Piggyback:325.4] Out: 20 [Drains:20] Intake/Output this shift: No intake/output data recorded.  PE:  Constitutional: No acute distress, conversant, appears states age. Eyes: Anicteric sclerae, moist conjunctiva, no lid lag Lungs: Clear to auscultation bilaterally, normal respiratory effort CV: regular rate and rhythm, no murmurs, no peripheral edema, pedal pulses 2+ GI: Soft, no masses or hepatosplenomegaly, non-tender to palpation Skin: No rashes, palpation reveals normal turgor Psychiatric: appropriate judgment and insight, oriented to person, place, and time   Lab Results:  Recent Labs    05/01/24 1709 05/02/24 0620  WBC 11.9* 16.0*  HGB 11.3* 11.1*  HCT 33.2* 33.4*  PLT 220 216   BMET Recent Labs    05/01/24 0701 05/01/24 1709 05/02/24 0620  NA 141  --  138  K 4.3  --  4.1  CL 106  --  105  CO2 22  --  24  GLUCOSE 83  --  140*  BUN 26*  --  23  CREATININE 0.71 0.85 0.93  CALCIUM  9.6  --  8.6*   PT/INR No results for input(s): "LABPROT", "INR" in the last 72 hours. ABG No results for input(s): "PHART", "HCO3" in the last 72 hours.  Invalid input(s): "PCO2", "PO2"  Studies/Results: CT ABDOMEN PELVIS W CONTRAST Result Date: 05/01/2024 CLINICAL DATA:  Epigastric and right upper abdominal pain radiating to the back EXAM: CT ABDOMEN AND PELVIS WITH CONTRAST TECHNIQUE: Multidetector CT imaging of the abdomen and pelvis was performed using the  standard protocol following bolus administration of intravenous contrast. RADIATION DOSE REDUCTION: This exam was performed according to the departmental dose-optimization program which includes automated exposure control, adjustment of the mA and/or kV according to patient size and/or use of iterative reconstruction technique. CONTRAST:  100mL OMNIPAQUE IOHEXOL 300 MG/ML  SOLN COMPARISON:  None Available. FINDINGS: Lower chest: No focal consolidation or pulmonary nodule in the lung bases. No pleural effusion or pneumothorax demonstrated. Partially imaged heart size is normal. Hepatobiliary: No focal hepatic lesions. Moderate intrahepatic bile duct dilation, likely secondary to cholecystectomy. Pancreas: No focal lesions or main ductal dilation. Spleen: Normal in size without focal abnormality. Adrenals/Urinary Tract: No adrenal nodules. No suspicious renal mass, calculi or hydronephrosis. Mild proximal right ureteral enhancement, likely reactive. No focal bladder wall thickening. Stomach/Bowel: Normal appearance of the stomach. Irregular free air and small volume free fluid posterior to the proximal duodenum. Multiple adjacent gas and fluid-filled diverticuli arising from the duodenum. Colonic diverticulosis without acute diverticulitis. Appendix is not discretely seen. Vascular/Lymphatic: Aortic atherosclerosis. No enlarged abdominal or pelvic lymph nodes. Reproductive: No adnexal masses. Other: No fluid collection. Musculoskeletal: No acute or abnormal lytic or blastic osseous lesions. Multilevel degenerative changes of the partially imaged thoracic and lumbar spine. IMPRESSION: 1. Irregular free air and small volume free fluid posterior to the proximal duodenum, suspicious for duodenal perforation, likely in the setting of peptic ulcer. 2. Colonic diverticulosis without acute diverticulitis. 3.  Aortic Atherosclerosis (ICD10-I70.0). Critical Value/emergent results were called  by telephone at the time of  interpretation on 05/01/2024 at 9:09 am to Deatra Face, MD, who verbally acknowledged these results. Electronically Signed   By: Limin  Xu M.D.   On: 05/01/2024 09:17   DG Chest Port 1 View Result Date: 05/01/2024 EXAM: 1 VIEW XRAY OF THE CHEST 05/01/2024 07:51:40 AM COMPARISON: 12/21/2016 CLINICAL HISTORY: Rule out free air. Pt coming from home via POV after waking up with pain in the epigastric area that wraps around the right side and into the back. Pt states she took antacids, ibuprofen, and Voltaren  gel with brief relief but then had rebound pain. FINDINGS: LUNGS AND PLEURA: No focal pulmonary opacity. No pulmonary edema. No pleural effusion. No pneumothorax. 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 HEART AND MEDIASTINUM: Mild cardiac enlargement is present. No acute abnormality of the mediastinal silhouette. BONES AND SOFT TISSUES: No acute osseous abnormality. No free air is present under the hemidiaphragms. IMPRESSION: 1. No free air under the hemidiaphragms. 2. No acute cardiopulmonary pathology. 3. Changes consistent with COPD. Electronically signed by: Audree Leas MD 05/01/2024 08:05 AM EDT RP Workstation: [id number]    Anti-infectives: Anti-infectives (From admission, onward)    Start     Dose/Rate Route Frequency Ordered Stop   05/01/24 1730  piperacillin-tazobactam (ZOSYN) IVPB 3.375 g        3.375 g 12.5 mL/hr over 240 Minutes Intravenous Every 8 hours 05/01/24 1644     05/01/24 1600  piperacillin-tazobactam (ZOSYN) IVPB 3.375 g  Status:  Discontinued        3.375 g 12.5 mL/hr over 240 Minutes Intravenous Every 8 hours 05/01/24 0928 05/01/24 1644   05/01/24 0930  piperacillin-tazobactam (ZOSYN) IVPB 3.375 g        3.375 g 100 mL/hr over 30 Minutes Intravenous  Once 05/01/24 0928 05/01/24 1023       Assessment/Plan: s/p Procedure(s): LAPAROSCOPY, DIAGNOSTIC 1.  Will plan on upper GI/small bowel follow-through today to evaluate and confirm no perforations 2.  I  discussed with the patient as well as her daughter the findings and thoughts following surgery and the reason for upper GI/small bowel follow-through. 3.  Patient may benefit from upper endoscopy 4 to 6 weeks down the road to evaluate stomach.  LOS: 1 day    Leslie Reed 05/02/2024

## 2024-05-02 NOTE — Plan of Care (Signed)

## 2024-05-02 NOTE — Plan of Care (Signed)
   Problem: Education: Goal: Knowledge of General Education information will improve Description Including pain rating scale, medication(s)/side effects and non-pharmacologic comfort measures Outcome: Progressing   Problem: Clinical Measurements: Goal: Ability to maintain clinical measurements within normal limits will improve Outcome: Progressing   Problem: Clinical Measurements: Goal: Will remain free from infection Outcome: Progressing

## 2024-05-02 NOTE — Plan of Care (Signed)
  Problem: Pain Managment: Goal: General experience of comfort will improve and/or be controlled Outcome: Progressing   Problem: Safety: Goal: Ability to remain free from injury will improve Outcome: Progressing

## 2024-05-03 ENCOUNTER — Telehealth: Payer: Self-pay | Admitting: Gastroenterology

## 2024-05-03 LAB — CBC
HCT: 31.4 % — ABNORMAL LOW (ref 36.0–46.0)
Hemoglobin: 10.2 g/dL — ABNORMAL LOW (ref 12.0–15.0)
MCH: 30.5 pg (ref 26.0–34.0)
MCHC: 32.5 g/dL (ref 30.0–36.0)
MCV: 94 fL (ref 80.0–100.0)
Platelets: 212 10*3/uL (ref 150–400)
RBC: 3.34 MIL/uL — ABNORMAL LOW (ref 3.87–5.11)
RDW: 14.4 % (ref 11.5–15.5)
WBC: 10.1 10*3/uL (ref 4.0–10.5)
nRBC: 0 % (ref 0.0–0.2)

## 2024-05-03 LAB — BASIC METABOLIC PANEL WITH GFR
Anion gap: 11 (ref 5–15)
BUN: 25 mg/dL — ABNORMAL HIGH (ref 8–23)
CO2: 22 mmol/L (ref 22–32)
Calcium: 8.4 mg/dL — ABNORMAL LOW (ref 8.9–10.3)
Chloride: 106 mmol/L (ref 98–111)
Creatinine, Ser: 0.78 mg/dL (ref 0.44–1.00)
GFR, Estimated: >60 mL/min (ref >60)
Glucose, Bld: 91 mg/dL (ref 70–99)
Potassium: 3.8 mmol/L (ref 3.5–5.1)
Sodium: 139 mmol/L (ref 135–145)

## 2024-05-03 MED ORDER — ACETAMINOPHEN 500 MG PO TABS
1000.0000 mg | ORAL_TABLET | Freq: Four times a day (QID) | ORAL | Status: DC | PRN
  Administered 2024-05-03 – 2024-05-06 (×5): 1000 mg via ORAL
  Filled 2024-05-03 (×5): qty 2

## 2024-05-03 MED ORDER — PANTOPRAZOLE SODIUM 40 MG PO TBEC
40.0000 mg | DELAYED_RELEASE_TABLET | Freq: Every day | ORAL | Status: DC
  Administered 2024-05-03 – 2024-05-05 (×3): 40 mg via ORAL
  Filled 2024-05-03 (×3): qty 1

## 2024-05-03 MED ORDER — SACCHAROMYCES BOULARDII 250 MG PO CAPS
250.0000 mg | ORAL_CAPSULE | Freq: Two times a day (BID) | ORAL | Status: DC
  Administered 2024-05-03 – 2024-05-05 (×6): 250 mg via ORAL
  Filled 2024-05-03 (×6): qty 1

## 2024-05-03 MED ORDER — TRAMADOL HCL 50 MG PO TABS: 50.0000 mg | ORAL_TABLET | Freq: Four times a day (QID) | ORAL | Status: DC | PRN

## 2024-05-03 NOTE — Telephone Encounter (Signed)
 Patient returned call. Discussed message from provider that patient needs to speak with surgical team at the hospital. Patient verbalized understanding.

## 2024-05-03 NOTE — Plan of Care (Signed)

## 2024-05-03 NOTE — Discharge Instructions (Signed)

## 2024-05-03 NOTE — Telephone Encounter (Addendum)
 Spoke with patient and daughter. Was admitted to [facility] for abdominal pain of 9/10. Shooting across abdomen from L upper and lower quadrants to R upper and lower quadrants. Suspected duodenal perforation. She remains at [facility]. Surgeon did not see any evidence of a perforation during surgery. Patient states that pain is now 1-2/10. Patient states that last night she had an ice pop and within about 45 minutes the pain came back to a 9-10/10. Patient and daughter were requesting an earlier appointment. Was able to schedule for 06/17. They are concerned with what to do if the pain returns after discharge. Patient states she will not be discharged until she can eat solid food and have a bm. Plan is to move to full liquid diet today. Advised patient that she should be going home with medication and they should give her discharge instructions on what to do if pain returns.Patient and daughter would like to know if provider has any further recommendations.

## 2024-05-03 NOTE — Anesthesia Postprocedure Evaluation (Signed)
 Anesthesia Post Note  Patient: Leslie Reed  Procedure(s) Performed: LAPAROSCOPY, DIAGNOSTIC     Patient location during evaluation: PACU Anesthesia Type: General Level of consciousness: awake and alert, oriented and patient cooperative Pain management: pain level controlled Vital Signs Assessment: post-procedure vital signs reviewed and stable Respiratory status: spontaneous breathing, nonlabored ventilation and respiratory function stable Cardiovascular status: blood pressure returned to baseline and stable Postop Assessment: no apparent nausea or vomiting Anesthetic complications: no   No notable events documented.  Last Vitals:  Vitals:   05/02/24 1646 05/03/24 0525  BP: 109/64 (!) 100/56  Pulse: 78 74  Resp: 18 18  Temp: 36.8 C 36.9 C  SpO2: 90% 94%    Last Pain:  Vitals:   05/03/24 0125  TempSrc:   PainSc: Asleep                 Jacquelyne Matte

## 2024-05-03 NOTE — Telephone Encounter (Signed)
 Attempted to reach patient to discuss provider recommendations. No answer, left vm for patient to return call.

## 2024-05-03 NOTE — Telephone Encounter (Signed)
 Inbound call from patient and patient's daughter stating patient is currently hospitalized for severe abdominal pain. Stated provider originally thought that patient had a ulcer and rushed her into surgery but discovered patient has a small hiatal hernia otherwise organs looked fine. States patient is expected to be release in 48 hours but still has no clear answer where pain is coming from. Daughter stated she is concerned once patient is dismissed the same incident will happen. Patient is currently scheduled for 6/26. Requesting a call back to discuss further. Please advise, thank you.

## 2024-05-03 NOTE — Progress Notes (Signed)
 Progress Note  2 Days Post-Op  Subjective: Pt reported increased upper abdominal pain yesterday 45 min after broth. She has not had a BM in several days, but passing flatus. Some nausea but no vomiting.   Objective: Vital signs in last 24 hours: Temp:  [98.2 F (36.8 C)-98.5 F (36.9 C)] 98.2 F (36.8 C) (06/05 0811) Pulse Rate:  [74-79] 79 (06/05 0811) Resp:  [17-18] 17 (06/05 0811) BP: (100-122)/(56-68) 122/68 (06/05 0811) SpO2:  [90 %-94 %] 94 % (06/05 0811) Last BM Date : 04/30/24  Intake/Output from previous day: 06/04 0701 - 06/05 0700 In: 986.9 [P.O.:620; I.V.:120.7; IV Piggyback:246.2] Out: 105 [Drains:105] Intake/Output this shift: No intake/output data recorded.  PE: General: pleasant, WD, elderly female who is laying in bed in NAD Heart: regular, rate, and rhythm. Lungs: Respiratory effort nonlabored Abd: soft, appropriately ttp, incisions C/D/I, drain SS Psych: A&Ox3 with an appropriate affect.    Lab Results:  Recent Labs    05/02/24 0620 05/03/24 0637  WBC 16.0* 10.1  HGB 11.1* 10.2*  HCT 33.4* 31.4*  PLT 216 212   BMET Recent Labs    05/02/24 0620 05/03/24 0637  NA 138 139  K 4.1 3.8  CL 105 106  CO2 24 22  GLUCOSE 140* 91  BUN 23 25*  CREATININE 0.93 0.78  CALCIUM  8.6* 8.4*   PT/INR No results for input(s): "LABPROT", "INR" in the last 72 hours. CMP     Component Value Date/Time   NA 139 05/03/2024 0637   NA 143 04/26/2024 1609   K 3.8 05/03/2024 0637   CL 106 05/03/2024 0637   CO2 22 05/03/2024 0637   GLUCOSE 91 05/03/2024 0637   BUN 25 (H) 05/03/2024 0637   BUN 21 04/26/2024 1609   CREATININE 0.78 05/03/2024 0637   CREATININE 0.66 07/01/2015 1415   CALCIUM  8.4 (L) 05/03/2024 0637   PROT 6.1 (L) 05/01/2024 0701   PROT 6.7 04/26/2024 1609   ALBUMIN 4.0 05/01/2024 0701   ALBUMIN 4.8 04/26/2024 1609   AST 19 05/01/2024 0701   ALT 13 05/01/2024 0701   ALKPHOS 58 05/01/2024 0701   BILITOT 0.5 05/01/2024 0701   BILITOT  0.3 04/26/2024 1609   GFRNONAA >60 05/03/2024 0637   Lipase     Component Value Date/Time   LIPASE 17 05/01/2024 0701       Studies/Results: DG UGI W SINGLE CM (SOL OR THIN BA) Result Date: 05/02/2024 CLINICAL DATA:  Postop perforated duodenal ulcer repair. EXAM: DG UGI W SINGLE CM TECHNIQUE: Single contrast examination was then performed using thin liquid barium. This exam was performed by Lambert Pillion, PA-C, and was supervised and interpreted by Shearon Denis, MD. FLUOROSCOPY: Radiation Exposure Index (as provided by the fluoroscopic device): 23.40 mGy Kerma COMPARISON:  None Available. FINDINGS: Esophagus:  Not evaluated. Esophageal motility:  Not evaluated. Gastroesophageal reflux:  Not evaluated. Ingested 13mm barium tablet: Not given. Stomach: Very small hiatal hernia. Gastric emptying: Normal. Duodenum:  Diverticula without contrast extravasation. Other:  None. IMPRESSION: No extravasation of contrast status post perforated duodenal ulcer repair. Procedure performed by Lambert Pillion, PA-C Electronically Signed   By: Shearon Denis M.D.   On: 05/02/2024 14:20    Anti-infectives: Anti-infectives (From admission, onward)    Start     Dose/Rate Route Frequency Ordered Stop   05/01/24 1730  piperacillin-tazobactam (ZOSYN) IVPB 3.375 g        3.375 g 12.5 mL/hr over 240 Minutes Intravenous Every 8 hours 05/01/24 1644  05/01/24 1600  piperacillin-tazobactam (ZOSYN) IVPB 3.375 g  Status:  Discontinued        3.375 g 12.5 mL/hr over 240 Minutes Intravenous Every 8 hours 05/01/24 0928 05/01/24 1644   05/01/24 0930  piperacillin-tazobactam (ZOSYN) IVPB 3.375 g        3.375 g 100 mL/hr over 30 Minutes Intravenous  Once 05/01/24 6387 05/01/24 1023        Assessment/Plan  POD2 s/p diagnostic laparoscopy with drain placement  - UGI yesterday negative for leak - on CLD and drain SS - advance to FLD today  - pain control with tylenol and tramadol today  - continue protonix, would  recommend GI follow up in a few weeks for consideration of EGD  - discussed with patient and daughter at bedside - would not currently recommend oncology or screening for malignancy based on current findings but can follow up with PCP for concerns   FEN: FLD, SLIV VTE: LMWH ID: [email or web address] 6/3>>  LOS: 2 days     Annetta Killian, [facility] Surgery 05/03/2024, 10:11 AM Please see Amion for pager number during day hours 7:00am-4:30pm

## 2024-05-04 LAB — CBC
HCT: 33.2 % — ABNORMAL LOW (ref 36.0–46.0)
Hemoglobin: 10.9 g/dL — ABNORMAL LOW (ref 12.0–15.0)
MCH: 30.6 pg (ref 26.0–34.0)
MCHC: 32.8 g/dL (ref 30.0–36.0)
MCV: 93.3 fL (ref 80.0–100.0)
Platelets: 213 10*3/uL (ref 150–400)
RBC: 3.56 MIL/uL — ABNORMAL LOW (ref 3.87–5.11)
RDW: 13.6 % (ref 11.5–15.5)
WBC: 6.4 10*3/uL (ref 4.0–10.5)
nRBC: 0 % (ref 0.0–0.2)

## 2024-05-04 LAB — BASIC METABOLIC PANEL WITH GFR
Anion gap: 11 (ref 5–15)
BUN: 15 mg/dL (ref 8–23)
CO2: 24 mmol/L (ref 22–32)
Calcium: 8.7 mg/dL — ABNORMAL LOW (ref 8.9–10.3)
Chloride: 106 mmol/L (ref 98–111)
Creatinine, Ser: 0.73 mg/dL (ref 0.44–1.00)
GFR, Estimated: >60 mL/min (ref >60)
Glucose, Bld: 97 mg/dL (ref 70–99)
Potassium: 3.8 mmol/L (ref 3.5–5.1)
Sodium: 141 mmol/L (ref 135–145)

## 2024-05-04 LAB — MAGNESIUM: Magnesium: 2.1 mg/dL (ref 1.7–2.4)

## 2024-05-04 MED ORDER — SUCRALFATE 1 GM/10ML PO SUSP
1.0000 g | Freq: Three times a day (TID) | ORAL | Status: DC
  Administered 2024-05-04 – 2024-05-06 (×8): 1 g via ORAL
  Filled 2024-05-04 (×8): qty 10

## 2024-05-04 MED ORDER — ORAL CARE MOUTH RINSE: 15.0000 mL | OROMUCOSAL | Status: DC | PRN

## 2024-05-04 MED ORDER — HYDROMORPHONE HCL 1 MG/ML IJ SOLN: 0.5000 mg | INTRAMUSCULAR | Status: DC | PRN

## 2024-05-04 NOTE — Care Management Important Message (Signed)
 Important Message  Patient Details  Name: Leslie Reed MRN: [id number] Date of Birth: 1946/11/15   Important Message Given:  Yes - Medicare IM     Felix Host 05/04/2024, 12:45 PM

## 2024-05-04 NOTE — Progress Notes (Signed)
 Progress Note  3 Days Post-Op  Subjective: Pt reports feeling a little better after having a BM this AM and moving more. She did have some intense upper abdominal pain similar to when she came in after eating some cream of chicken soup last night. Passing more flatus as well.   Objective: Vital signs in last 24 hours: Temp:  [97.7 F (36.5 C)-98 F (36.7 C)] 97.8 F (36.6 C) (06/06 0750) Pulse Rate:  [57-72] 58 (06/06 0750) Resp:  [16-18] 16 (06/06 0750) BP: (110-133)/(61-69) 127/63 (06/06 0750) SpO2:  [90 %-100 %] 92 % (06/06 0750) Last BM Date : 05/04/24  Intake/Output from previous day: 06/05 0701 - 06/06 0700 In: 649.4 [P.O.:600; IV Piggyback:49.4] Out: 80 [Drains:80] Intake/Output this shift: Total I/O In: -  Out: 1 [Stool:1]  PE: General: pleasant, WD, elderly female who is laying in bed in NAD Heart: regular, rate, and rhythm. Lungs: Respiratory effort nonlabored Abd: soft, appropriately ttp, incisions C/D/I, drain SS Psych: A&Ox3 with an appropriate affect.    Lab Results:  Recent Labs    05/02/24 0620 05/03/24 0637  WBC 16.0* 10.1  HGB 11.1* 10.2*  HCT 33.4* 31.4*  PLT 216 212   BMET Recent Labs    05/02/24 0620 05/03/24 0637  NA 138 139  K 4.1 3.8  CL 105 106  CO2 24 22  GLUCOSE 140* 91  BUN 23 25*  CREATININE 0.93 0.78  CALCIUM  8.6* 8.4*   PT/INR No results for input(s): "LABPROT", "INR" in the last 72 hours. CMP     Component Value Date/Time   NA 139 05/03/2024 0637   NA 143 04/26/2024 1609   K 3.8 05/03/2024 0637   CL 106 05/03/2024 0637   CO2 22 05/03/2024 0637   GLUCOSE 91 05/03/2024 0637   BUN 25 (H) 05/03/2024 0637   BUN 21 04/26/2024 1609   CREATININE 0.78 05/03/2024 0637   CREATININE 0.66 07/01/2015 1415   CALCIUM  8.4 (L) 05/03/2024 0637   PROT 6.1 (L) 05/01/2024 0701   PROT 6.7 04/26/2024 1609   ALBUMIN 4.0 05/01/2024 0701   ALBUMIN 4.8 04/26/2024 1609   AST 19 05/01/2024 0701   ALT 13 05/01/2024 0701   ALKPHOS  58 05/01/2024 0701   BILITOT 0.5 05/01/2024 0701   BILITOT 0.3 04/26/2024 1609   GFRNONAA >60 05/03/2024 0637   Lipase     Component Value Date/Time   LIPASE 17 05/01/2024 0701       Studies/Results: DG UGI W SINGLE CM (SOL OR THIN BA) Result Date: 05/02/2024 CLINICAL DATA:  Postop perforated duodenal ulcer repair. EXAM: DG UGI W SINGLE CM TECHNIQUE: Single contrast examination was then performed using thin liquid barium. This exam was performed by Lambert Pillion, PA-C, and was supervised and interpreted by Shearon Denis, MD. FLUOROSCOPY: Radiation Exposure Index (as provided by the fluoroscopic device): 23.40 mGy Kerma COMPARISON:  None Available. FINDINGS: Esophagus:  Not evaluated. Esophageal motility:  Not evaluated. Gastroesophageal reflux:  Not evaluated. Ingested 13mm barium tablet: Not given. Stomach: Very small hiatal hernia. Gastric emptying: Normal. Duodenum:  Diverticula without contrast extravasation. Other:  None. IMPRESSION: No extravasation of contrast status post perforated duodenal ulcer repair. Procedure performed by Lambert Pillion, PA-C Electronically Signed   By: Shearon Denis M.D.   On: 05/02/2024 14:20    Anti-infectives: Anti-infectives (From admission, onward)    Start     Dose/Rate Route Frequency Ordered Stop   05/01/24 1730  piperacillin-tazobactam (ZOSYN) IVPB 3.375 g  3.375 g 12.5 mL/hr over 240 Minutes Intravenous Every 8 hours 05/01/24 1644     05/01/24 1600  piperacillin-tazobactam (ZOSYN) IVPB 3.375 g  Status:  Discontinued        3.375 g 12.5 mL/hr over 240 Minutes Intravenous Every 8 hours 05/01/24 0928 05/01/24 1644   05/01/24 0930  piperacillin-tazobactam (ZOSYN) IVPB 3.375 g        3.375 g 100 mL/hr over 30 Minutes Intravenous  Once 05/01/24 1610 05/01/24 1023        Assessment/Plan  POD3 s/p diagnostic laparoscopy with drain placement  - UGI 6/4 negative for leak - on FLD and drain SS - advance to reg today  - pain control with  tylenol and tramadol - continue protonix, would recommend GI follow up in a few weeks for consideration of EGD, added carafate today as well  - continue to mobilize as tolerated - check labs this AM - if labs ok and continuing to progress anticipate ready to DC home over the weekend   FEN: reg diet VTE: LMWH ID: [email or web address] 6/3>>  LOS: 3 days     Annetta Killian, [facility] Surgery 05/04/2024, 10:43 AM Please see Amion for pager number during day hours 7:00am-4:30pm

## 2024-05-04 NOTE — Plan of Care (Signed)
  Problem: Clinical Measurements: Goal: Ability to maintain clinical measurements within normal limits will improve Outcome: Progressing   Problem: Clinical Measurements: Goal: Will remain free from infection Outcome: Progressing   Problem: Clinical Measurements: Goal: Respiratory complications will improve Outcome: Progressing   Problem: Elimination: Goal: Will not experience complications related to urinary retention Outcome: Progressing

## 2024-05-05 ENCOUNTER — Inpatient Hospital Stay (HOSPITAL_COMMUNITY)

## 2024-05-05 MED ORDER — MELATONIN 5 MG PO TABS
5.0000 mg | ORAL_TABLET | Freq: Every evening | ORAL | Status: DC | PRN
  Administered 2024-05-06: 5 mg via ORAL
  Filled 2024-05-05: qty 1

## 2024-05-05 NOTE — Plan of Care (Signed)

## 2024-05-05 NOTE — Progress Notes (Signed)
 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  However, it has been several days since her last bowel movements.  Original CT scan from ED showed stool throughout the colon back to the cecum.   Objective: Vital signs in last 24 hours: Temp:  [97.7 F (36.5 C)-98.2 F (36.8 C)] 97.8 F (36.6 C) (06/07 0739) Pulse Rate:  [57-73] 58 (06/07 0739) Resp:  [16-17] 16 (06/07 0739) BP: (132-135)/(63-84) 134/84 (06/07 0739) SpO2:  [93 %-97 %] 97 % (06/07 0739) Last BM Date : 05/04/24  Intake/Output from previous day: 06/06 0701 - 06/07 0700 In: 49.1 [IV Piggyback:49.1] Out: 106 [Drains:105; Stool:1] Intake/Output this shift: No intake/output data recorded.  WDWN in AND Abd - soft, minimal tenderness Incisions c/d/I Drain output serous  Lab Results:  Recent Labs    05/03/24 0637 05/04/24 1127  WBC 10.1 6.4  HGB 10.2* 10.9*  HCT 31.4* 33.2*  PLT 212 213   BMET Recent Labs    05/03/24 0637 05/04/24 1127  NA 139 141  K 3.8 3.8  CL 106 106  CO2 22 24  GLUCOSE 91 97  BUN 25* 15  CREATININE 0.78 0.73  CALCIUM  8.4* 8.7*    Anti-infectives: Anti-infectives (From admission, onward)    Start     Dose/Rate Route Frequency Ordered Stop   05/01/24 1730  piperacillin -tazobactam (ZOSYN ) IVPB 3.375 g  Status:  Discontinued        3.375 g 12.5 mL/hr over 240 Minutes Intravenous Every 8 hours 05/01/24 1644 05/05/24 0814   05/01/24 1600  piperacillin -tazobactam (ZOSYN ) IVPB 3.375 g  Status:  Discontinued        3.375 g 12.5 mL/hr over 240 Minutes Intravenous Every 8 hours 05/01/24 0928 05/01/24 1644   05/01/24 0930  piperacillin -tazobactam (ZOSYN ) IVPB 3.375 g        3.375 g 100 mL/hr over 30 Minutes  Intravenous  Once 05/01/24 1610 05/01/24 1023       Assessment/Plan: POD 4 s/p diagnostic laparoscopy with drain placement - Melton Squires, 05/01/24 - UGI 6/4 negative for leak - regular diet  - Discontinue drain today - pain control with tylenol  and tramadol  - continue protonix , would recommend GI follow up in a few weeks for consideration of EGD, added carafate  today as well  - continue to mobilize as tolerated - no need for further abx - patient may have some retained firm stool and having diarrhea around this causing the cramping.  Will obtain plain abd x-rays today -  possible discharge tomorrow.   FEN: reg diet VTE: LMWH ID: [email or web address]  6/3>>  LOS: 4 days    Rella Cardinal 05/05/2024

## 2024-05-05 NOTE — Progress Notes (Signed)
 Pt has a CPAP at home and during her admission she has been ordered to wear O2 while sleeping. O2 on 2L

## 2024-05-06 MED ORDER — TRAMADOL HCL 50 MG PO TABS: 50.0000 mg | ORAL_TABLET | Freq: Four times a day (QID) | ORAL | 0 refills | Status: DC | PRN

## 2024-05-06 MED ORDER — ONDANSETRON 4 MG PO TBDP: 4.0000 mg | ORAL_TABLET | Freq: Four times a day (QID) | ORAL | 0 refills | Status: DC | PRN

## 2024-05-06 NOTE — Plan of Care (Signed)

## 2024-05-06 NOTE — Plan of Care (Signed)

## 2024-05-06 NOTE — Progress Notes (Signed)
 5 Days Post-Op   Subjective/Chief Complaint: LLQ cramping improved Several more bowel movements - more solid Plain films - contrast throughout colon and rectum Appetite slightly improved   Objective: Vital signs in last 24 hours: Temp:  [97.3 F (36.3 C)-98.4 F (36.9 C)] 97.9 F (36.6 C) (06/08 0759) Pulse Rate:  [50-70] 50 (06/08 0759) Resp:  [16-17] 17 (06/08 0759) BP: (126-169)/(68-77) 169/72 (06/08 0759) SpO2:  [96 %-99 %] 98 % (06/08 0759) Last BM Date : 05/04/24  Intake/Output from previous day: No intake/output data recorded. Intake/Output this shift: No intake/output data recorded.  WDWN in NAD Abd - soft, non-tender Incisions c/d/I Drain removed yesterday  Lab Results:  Recent Labs    05/04/24 1127  WBC 6.4  HGB 10.9*  HCT 33.2*  PLT 213   BMET Recent Labs    05/04/24 1127  NA 141  K 3.8  CL 106  CO2 24  GLUCOSE 97  BUN 15  CREATININE 0.73  CALCIUM  8.7*   PT/INR No results for input(s): "LABPROT", "INR" in the last 72 hours. ABG No results for input(s): "PHART", "HCO3" in the last 72 hours.  Invalid input(s): "PCO2", "PO2"  Studies/Results: DG Abd 1 View Result Date: 05/05/2024 CLINICAL DATA:  Left lower quadrant abdominal pain. EXAM: ABDOMEN - 1 VIEW COMPARISON:  CT abdomen pelvis dated 05/01/2024. FINDINGS: Oral contrast noted throughout the colon and within the rectum. Contrast noted in the sigmoid diverticula. No bowel dilatation or evidence of obstruction. No free air. Right upper quadrant cholecystectomy clips. Osteopenia with degenerative changes of the spine. No acute osseous pathology. IMPRESSION: Nonobstructive bowel gas pattern. Electronically Signed   By: Angus Bark M.D.   On: 05/05/2024 10:02    Anti-infectives: Anti-infectives (From admission, onward)    Start     Dose/Rate Route Frequency Ordered Stop   05/01/24 1730  piperacillin -tazobactam (ZOSYN ) IVPB 3.375 g  Status:  Discontinued        3.375 g 12.5 mL/hr over  240 Minutes Intravenous Every 8 hours 05/01/24 1644 05/05/24 0814   05/01/24 1600  piperacillin -tazobactam (ZOSYN ) IVPB 3.375 g  Status:  Discontinued        3.375 g 12.5 mL/hr over 240 Minutes Intravenous Every 8 hours 05/01/24 0928 05/01/24 1644   05/01/24 0930  piperacillin -tazobactam (ZOSYN ) IVPB 3.375 g        3.375 g 100 mL/hr over 30 Minutes Intravenous  Once 05/01/24 7253 05/01/24 1023       Assessment/Plan: POD 5 s/p diagnostic laparoscopy with drain placement - Melton Squires, 05/01/24 - UGI 6/4 negative for leak - regular diet  - Drain removed 6/7 - pain control with tylenol  and tramadol  - continue protonix , would recommend GI follow up in a few weeks for consideration of EGD, added carafate  today as well  - continue to mobilize as tolerated - no need for further abx -  Discharge today   FEN: reg diet VTE: LMWH ID: [email or web address]  6/3>>  LOS: 5 days    Rella Cardinal 05/06/2024

## 2024-05-07 NOTE — Discharge Summary (Signed)
 [facility] Discharge Summary   Patient ID: ZAILEE VALLELY MRN: [id number] DOB/AGE: 03/13/1946 78 y.o.  Admit date: 05/01/2024 Discharge date: 05/07/2024  Admitting Diagnosis: Perforated duodenal ulcer  Discharge Diagnosis Duodenitis without perforation  Multiple small bowel diverticula S/P diagnostic laparoscopy   Consultants None   Imaging: No results found.  Procedures Dr. Shela Derby (05/01/24) - Diagnostic laparoscopy with drain placement   Hospital Course:  Patient is a 78 year old female who presented to [facility] with abdominal pain.  Workup showed concern for perforated duodenal ulcer.  Patient was admitted and underwent procedure listed above. Perforation was not seen intraoperatively but was found to have multiple small  bowel diverticula. Drain left to monitor. Tolerated procedure well and was transferred to the floor.  UGI on POD1 was negative for leak. Diet was advanced as tolerated. Recommended for outpatient follow up with GI, referral sent.   On POD5, the patient was voiding well, tolerating diet, ambulating well, pain well controlled, vital signs stable, incisions c/d/i and felt stable for discharge home.  Patient will follow up in our office in 2-3 weeks and knows to call with questions or concerns. She will call to confirm appointment date/time.    Physical Exam: General:  Alert, NAD, pleasant, comfortable Abd:  Soft, ND, mild tenderness, incisions C/D/I, drain removed prior to discharge  I or a member of my team have reviewed this patient in the Controlled Substance Database.   Allergies as of 05/06/2024   No Known Allergies      Medication List     TAKE these medications    calcium  carbonate 500 MG chewable tablet Commonly known as: TUMS - dosed in mg elemental calcium  Chew 2 tablets by mouth as needed for indigestion or heartburn.   carboxymethylcellulose 0.5 % Soln Commonly known as: REFRESH PLUS Place 1 drop into both eyes as  needed.   cyanocobalamin  1000 MCG tablet Commonly known as: VITAMIN B12 Take 1,000 mcg by mouth daily.   Flutter Devi 1 each by Does not apply route 2 (two) times daily.   meloxicam  15 MG tablet Commonly known as: MOBIC  Take 1 tablet (15 mg total) by mouth daily. Take for 1 week, then as needed. What changed:  when to take this reasons to take this additional instructions   olmesartan 5 MG tablet Commonly known as: BENICAR Take 5 mg by mouth daily.   omeprazole 20 MG capsule Commonly known as: PRILOSEC Take 20 mg by mouth daily.   ondansetron  4 MG disintegrating tablet Commonly known as: ZOFRAN -ODT Take 1 tablet (4 mg total) by mouth every 6 (six) hours as needed for nausea.   ROGAINE EX Apply 1 Application topically as needed.   ROLAIDS PO Take 2 tablets by mouth as needed.   Ryaltris  665-25 MCG/ACT Susp Generic drug: Olopatadine-Mometasone Place 1-2 sprays into the nose in the morning and at bedtime. What changed: additional instructions   traMADol  50 MG tablet Commonly known as: ULTRAM  Take 1 tablet (50 mg total) by mouth every 6 (six) hours as needed for moderate pain (pain score 4-6).   TYLENOL  ALLERGY SINUS PO Take 1-2 tablets by mouth as needed.   UNABLE TO FIND Apply 1 Application topically as needed. Mentholatum Ointment   Vitamin D3 50 MCG (2000 UT) capsule Take 2 capsules (4,000 Units total) by mouth daily.          Follow-up Information     Shela Derby, MD. Go on 05/22/2024.   Specialty: General Surgery Why: 9:00  AM, please arrive 30 min prior to appointment time to check in. Contact information: [address] [place] [address] [phone]         [facility] Follow up.   Specialty: Gastroenterology Contact information: [address] [place]  [address] [phone]        Reginal Capra, MD. Schedule an appointment as soon as possible for a visit.    Specialties: Internal Medicine, Pediatrics Why: As needed Contact information: [address] [place] [address] [phone]                 Signed: Annetta Killian , [facility] 05/07/2024, 11:29 AM Please see Amion for pager number during day hours 7:00am-4:30pm

## 2024-05-09 LAB — CBC WITH DIFFERENTIAL/PLATELET
Basophils Absolute: 0.1 10*3/uL (ref 0.0–0.2)
Basos: 1 %
EOS (ABSOLUTE): 0.1 10*3/uL (ref 0.0–0.4)
Eos: 1 %
Hematocrit: 43.7 % (ref 34.0–46.6)
Hemoglobin: 14.4 g/dL (ref 11.1–15.9)
Immature Grans (Abs): 0 10*3/uL (ref 0.0–0.1)
Immature Granulocytes: 0 %
Lymphocytes Absolute: 1.6 10*3/uL (ref 0.7–3.1)
Lymphs: 25 %
MCH: 30.9 pg (ref 26.6–33.0)
MCHC: 33 g/dL (ref 31.5–35.7)
MCV: 94 fL (ref 79–97)
Monocytes Absolute: 0.5 10*3/uL (ref 0.1–0.9)
Monocytes: 8 %
Neutrophils Absolute: 4 10*3/uL (ref 1.4–7.0)
Neutrophils: 65 %
Platelets: 296 10*3/uL (ref 150–450)
RBC: 4.66 x10E6/uL (ref 3.77–5.28)
RDW: 13.1 % (ref 11.7–15.4)
WBC: 6.2 10*3/uL (ref 3.4–10.8)

## 2024-05-09 LAB — COMPREHENSIVE METABOLIC PANEL WITH GFR
ALT: 21 IU/L (ref 0–32)
AST: 19 IU/L (ref 0–40)
Albumin: 4.8 g/dL (ref 3.8–4.8)
Alkaline Phosphatase: 76 IU/L (ref 44–121)
BUN/Creatinine Ratio: 30 — ABNORMAL HIGH (ref 12–28)
BUN: 21 mg/dL (ref 8–27)
Bilirubin Total: 0.3 mg/dL (ref 0.0–1.2)
CO2: 23 mmol/L (ref 20–29)
Calcium: 9.8 mg/dL (ref 8.7–10.3)
Chloride: 104 mmol/L (ref 96–106)
Creatinine, Ser: 0.7 mg/dL (ref 0.57–1.00)
Globulin, Total: 1.9 g/dL (ref 1.5–4.5)
Glucose: 95 mg/dL (ref 70–99)
Potassium: 5 mmol/L (ref 3.5–5.2)
Sodium: 143 mmol/L (ref 134–144)
Total Protein: 6.7 g/dL (ref 6.0–8.5)
eGFR: 89 mL/min/{1.73_m2} (ref >59)

## 2024-05-09 LAB — PROTEIN ELECTROPHORESIS, SERUM
A/G Ratio: 1.6 (ref 0.7–1.7)
Albumin ELP: 4.1 g/dL (ref 2.9–4.4)
Alpha 1: 0.3 g/dL (ref 0.0–0.4)
Alpha 2: 0.7 g/dL (ref 0.4–1.0)
Beta: 1 g/dL (ref 0.7–1.3)
Gamma Globulin: 0.6 g/dL (ref 0.4–1.8)
Globulin, Total: 2.6 g/dL (ref 2.2–3.9)

## 2024-05-09 LAB — ATN PROFILE
A -- Beta-amyloid 42/40 Ratio: 0.118 (ref >0.102)
Beta-amyloid 40: 189.47 pg/mL
Beta-amyloid 42: 22.34 pg/mL
N -- NfL, Plasma: 2.2 pg/mL (ref 0.00–6.04)
T -- p-tau181: 0.42 pg/mL (ref 0.00–0.97)

## 2024-05-09 LAB — ANA W/REFLEX: Anti Nuclear Antibody (ANA): NEGATIVE

## 2024-05-09 LAB — APOE ALZHEIMER'S RISK

## 2024-05-09 LAB — TSH+FREE T4
Free T4: 1.08 ng/dL (ref 0.82–1.77)
TSH: 1.24 u[IU]/mL (ref 0.450–4.500)

## 2024-05-09 LAB — VITAMIN B12: Vitamin B-12: 1010 pg/mL (ref 232–1245)

## 2024-05-09 LAB — SEDIMENTATION RATE: Sed Rate: 5 mm/h (ref 0–40)

## 2024-05-13 ENCOUNTER — Ambulatory Visit
Admission: RE | Admit: 2024-05-13 | Discharge: 2024-05-13 | Disposition: A | Discharge status: Home / Self Care | Source: Ambulatory Visit | Attending: Neurology | Admitting: Neurology

## 2024-05-13 DIAGNOSIS — G3184 Mild cognitive impairment, so stated: Secondary | ICD-10-CM | POA: Diagnosis not present

## 2024-05-13 DIAGNOSIS — G4733 Obstructive sleep apnea (adult) (pediatric): Secondary | ICD-10-CM

## 2024-05-13 MED ORDER — GADOPICLENOL 0.5 MMOL/ML IV SOLN
7.0000 mL | Freq: Once | INTRAVENOUS | Status: AC | PRN
  Administered 2024-05-13: 7 mL via INTRAVENOUS

## 2024-05-15 ENCOUNTER — Encounter: Payer: Self-pay | Admitting: Gastroenterology

## 2024-05-15 ENCOUNTER — Ambulatory Visit (INDEPENDENT_AMBULATORY_CARE_PROVIDER_SITE_OTHER): Admitting: Gastroenterology

## 2024-05-15 VITALS — BP 108/58 | HR 81 | Ht 64.0 in | Wt 136.5 lb

## 2024-05-15 DIAGNOSIS — R1013 Epigastric pain: Secondary | ICD-10-CM

## 2024-05-15 DIAGNOSIS — R933 Abnormal findings on diagnostic imaging of other parts of digestive tract: Secondary | ICD-10-CM | POA: Insufficient documentation

## 2024-05-15 MED ORDER — OMEPRAZOLE 40 MG PO CPDR: 40.0000 mg | DELAYED_RELEASE_CAPSULE | Freq: Every day | ORAL | 5 refills | Status: DC

## 2024-05-15 NOTE — Progress Notes (Signed)
 05/15/2024 Leslie Reed [id number] [birth year]   HISTORY OF PRESENT ILLNESS: This is a 78 year old female who is a patient of Dr. Allean Aran.  She is here today for follow-up from her recent hospital stay.  She presented to the hospital on 6/3 with complaints of severe abdominal pain.  Said that the day prior to the onset of this pain she felt absolutely fine.  The only thing preceding all this was that she had had a few weeks where she had an awful taste in her mouth and bad breath.  One day prior to the onset of this pain she completed a course of Augmentin  for possible sinusitis issues that they thought may have been causing the bad breath.  Nonetheless, she had no abdominal pain.  She presented to the ER with this abdominal pain though on 4/3 and CT scan as below showed concern for duodenal perforation, possible perforated peptic ulcer.  She was taken to the OR for exploratory laparoscopy by Dr. Melton Squires.  Not found to have any perforation.  Had a duodenal diverticulum and some inflammation noted.  No resection was performed.  She was referred here for follow-up to consider endoscopy.  She is feeling much better.  She is back to work.  She tells me that she had been taking some meloxicam , took it for about a week straight and then was just using as needed maybe twice a week.  Otherwise uses Tylenol  sinus for sinus related issues.  Right now she is back to eating regularly, just avoiding acidic things to prevent irritation.  Is on omeprazole 20 mg daily that was also started prior to all of this thinking maybe reflux was causing her bad taste in her mouth, but she denies any chronic heartburn or reflux issues.  CT scan abdomen and pelvis with contrast:  IMPRESSION: 1. Irregular free air and small volume free fluid posterior to the proximal duodenum, suspicious for duodenal perforation, likely in the setting of peptic ulcer. 2. Colonic diverticulosis without acute diverticulitis. 3.  Aortic  Atherosclerosis (ICD10-I70.0).    Past Medical History:  Diagnosis Date   Allergic rhinitis    Allergy    Arthritis    hands, lower back   Asthma    Bronchitis    uses inhaler prn   Cataract    just being monitored - no surgery   Colitis 8/04   Diverticulitis    GERD (gastroesophageal reflux disease)    diet controlled, no meds   History of migraine headaches    Hx of colonic polyp    Hx of varicella    Hyperlipidemia    Insomnia    Interstitial cystitis 10/90   Mitral valve prolapse    Osteopenia    Scoliosis 2005   Sleep apnea    Tick bite 2014   STARI-antibiotics x 5 weeks   Tracheobronchopathia-osteochondroplastica    Past Surgical History:  Procedure Laterality Date   ABDOMINAL HYSTERECTOMY     partial  for pain no cancer     ADENOIDECTOMY     APPENDECTOMY     BREAST BIOPSY Right    x 2    BRONCHIAL WASHINGS  04/22/2020   Procedure: BRONCHIAL lavage;  Surgeon: Joesph Mussel, DO;  Location: [facility];  Service: Endoscopy;;   CESAREAN SECTION     x4   CHOLECYSTECTOMY     colon polyps     COLONOSCOPY  09/09/2016   Dr.Nandigam   DILATION AND CURETTAGE OF UTERUS  x 2   LAPAROSCOPY  05/01/2024   Procedure: LAPAROSCOPY, DIAGNOSTIC;  Surgeon: Shela Derby, MD;  Location: [facility] OR;  Service: General;;   TONSILLECTOMY     VIDEO BRONCHOSCOPY N/A 04/22/2020   Procedure: VIDEO BRONCHOSCOPY WITHOUT FLUORO;  Surgeon: Joesph Mussel, DO;  Location: [facility];  Service: Endoscopy;  Laterality: N/A;   WISDOM TOOTH EXTRACTION      reports that she has never smoked. She has been exposed to tobacco smoke. She has never used smokeless tobacco. She reports that she does not currently use alcohol. She reports that she does not use drugs. family history includes Allergy (severe) in her son; Alzheimer's disease in her father; Colon cancer in her maternal grandfather; Colon polyps in her daughter; Depression in her son; Diabetes in her maternal grandmother, mother,  sister, and son; Hypertension in her brother, father, and mother; Kidney failure in her father; Psoriasis in her daughter; Pulmonary fibrosis in her mother; Sleep apnea in her brother; Thyroid  disease in her daughter and daughter. No Known Allergies    Outpatient Encounter Medications as of 05/15/2024  Medication Sig   calcium  carbonate (TUMS - DOSED IN MG ELEMENTAL CALCIUM ) 500 MG chewable tablet Chew 2 tablets by mouth as needed for indigestion or heartburn.   carboxymethylcellulose (REFRESH PLUS) 0.5 % SOLN Place 1 drop into both eyes as needed.   Chlorphen-Pseudoephed-APAP (TYLENOL  ALLERGY SINUS PO) Take 1-2 tablets by mouth as needed.   Cholecalciferol (VITAMIN D3) 50 MCG (2000 UT) capsule Take 2 capsules (4,000 Units total) by mouth daily.   cyanocobalamin  (VITAMIN B12) 1000 MCG tablet Take 1,000 mcg by mouth daily.   Dihydroxyaluminum Sod Carb (ROLAIDS PO) Take 2 tablets by mouth as needed.   melatonin 3 MG TABS tablet Take 3 mg by mouth at bedtime.   meloxicam  (MOBIC ) 15 MG tablet Take 1 tablet (15 mg total) by mouth daily. Take for 1 week, then as needed.   Minoxidil (ROGAINE EX) Apply 1 Application topically as needed.   olmesartan (BENICAR) 5 MG tablet Take 5 mg by mouth daily.   Olopatadine-Mometasone (RYALTRIS ) 665-25 MCG/ACT SUSP Place 1-2 sprays into the nose in the morning and at bedtime.   omeprazole (PRILOSEC) 20 MG capsule Take 20 mg by mouth daily.   ondansetron  (ZOFRAN -ODT) 4 MG disintegrating tablet Take 1 tablet (4 mg total) by mouth every 6 (six) hours as needed for nausea.   Respiratory Therapy Supplies (FLUTTER) DEVI 1 each by Does not apply route 2 (two) times daily.   UNABLE TO FIND Apply 1 Application topically as needed. Mentholatum Ointment   traMADol  (ULTRAM ) 50 MG tablet Take 1 tablet (50 mg total) by mouth every 6 (six) hours as needed for moderate pain (pain score 4-6). (Patient not taking: Reported on 05/15/2024)   Facility-Administered Encounter Medications  as of 05/15/2024  Medication   0.9 %  sodium chloride  infusion     REVIEW OF SYSTEMS  : All other systems reviewed and negative except where noted in the History of Present Illness.   PHYSICAL EXAM: BP (!) 108/58 (BP Location: Right Arm, Patient Position: Sitting, Cuff Size: Normal)   Pulse 81   Ht 5' 4 (1.626 m)   Wt 136 lb 8 oz (61.9 kg)   LMP 11/29/1986   BMI 23.43 kg/m  General: Well developed white female in no acute distress Head: Normocephalic and atraumatic Eyes:  Sclerae anicteric, conjunctiva pink. Ears: Normal auditory acuity Lungs: Clear throughout to auscultation; no W/R/R. Heart: Regular rate and rhythm; no  M/R/G. Abdomen: Soft, non-distended.  BS present.  Some upper/epigastric abdominal TTP. Musculoskeletal: Symmetrical with no gross deformities  Skin: No lesions on visible extremities Extremities: No edema  Neurological: Alert oriented x 4, grossly non-focal Psychological:  Alert and cooperative. Normal mood and affect  ASSESSMENT AND PLAN: *78 year old female with complaints of epigastric abdominal pain.  CT scan suggestive of perforated duodenal ulcer.  Exploratory laparoscopy revealed no sign of perforation.  She is feeling better.  He is on omeprazole 20 mg daily.  I am going to increase her omeprazole to 40 mg daily.  New prescription sent to pharmacy.  Will check stool for H. pylori with a Diatherix.  Takes meloxicam , uses about twice a week.  Will hold off on that for now.  Can use Tylenol  instead.  Will plan for EGD in a few weeks with Dr. Nandigam.  The risks, benefits, and alternatives to EGD were discussed with the patient and she consents to proceed.   CC:  Shela Derby, MD

## 2024-05-15 NOTE — Patient Instructions (Addendum)
 We have sent the following medications to your pharmacy for you to pick up at your convenience: Omeprazole 40 mg daily 30-60 minutes before breakfast.   Your provider has ordered Diatherix stool testing for you. You have received a kit from our office today containing all necessary supplies to complete this test. Please carefully read the stool collection instructions provided in the kit before opening the accompanying materials. In addition, be sure there is a label providing your full name and date of birth on the puritan opti-swab tube that is supplied in the kit (if you do not see a label with this information on your test tube, please make us  aware before test collection!). After completing the test, you should secure the purtian tube into the specimen biohazard bag. The [facility] E-Req sheet (including date and time of specimen collection) should be placed into the outside pocket of the specimen biohazard bag and returned to the [facility] lab (basement floor of [facility]) within 3 days of collection. Please make sure to give the specimen to a staff member at the lab. DO NOT leave the specimen on the counter.   If the specimen date and time (can be found in the upper right boxed portion of the sheet) are not filled out on the E-Req sheet, the test will NOT be performed.   You have been scheduled for an endoscopy. Please follow written instructions given to you at your visit today.  If you use inhalers (even only as needed), please bring them with you on the day of your procedure.  If you take any of the following medications, they will need to be adjusted prior to your procedure:   DO NOT TAKE 7 DAYS PRIOR TO TEST- Trulicity (dulaglutide) Ozempic, Wegovy (semaglutide) Mounjaro (tirzepatide) Bydureon Bcise (exanatide extended release)  DO NOT TAKE 1 DAY PRIOR TO YOUR TEST Rybelsus (semaglutide) Adlyxin (lixisenatide) Victoza (liraglutide) Byetta  (exanatide) _______________________________________________________  If your blood pressure at your visit was 140/90 or greater, please contact your primary care physician to follow up on this.  _______________________________________________________  If you are age 40 or older, your body mass index should be between 23-30. Your Body mass index is 23.43 kg/m. If this is out of the aforementioned range listed, please consider follow up with your Primary Care Provider.  If you are age 55 or younger, your body mass index should be between 19-25. Your Body mass index is 23.43 kg/m. If this is out of the aformentioned range listed, please consider follow up with your Primary Care Provider.   ________________________________________________________  The [facility] providers would like to encourage you to use MYCHART to communicate with providers for non-urgent requests or questions.  Due to long hold times on the telephone, sending your provider a message by [facility] may be a faster and more efficient way to get a response.  Please allow 48 business hours for a response.  Please remember that this is for non-urgent requests.  _______________________________________________________

## 2024-05-22 ENCOUNTER — Telehealth: Payer: Self-pay | Admitting: *Deleted

## 2024-05-22 NOTE — Telephone Encounter (Signed)
-----   Message from [place] D. Zehr sent at 05/18/2024  1:04 PM EDT ----- Please let her know that her H. pylori testing was negative.  Records sent for scanning.  Thank you,  Jess

## 2024-05-22 NOTE — Telephone Encounter (Signed)
 Patient informed of results.

## 2024-05-23 ENCOUNTER — Ambulatory Visit: Payer: PRIVATE HEALTH INSURANCE | Admitting: Internal Medicine

## 2024-05-23 ENCOUNTER — Encounter: Payer: Self-pay | Admitting: Internal Medicine

## 2024-05-23 VITALS — BP 120/72 | HR 73 | Temp 97.7°F | Ht 64.0 in | Wt 136.0 lb

## 2024-05-23 DIAGNOSIS — I10 Essential (primary) hypertension: Secondary | ICD-10-CM

## 2024-05-23 DIAGNOSIS — D649 Anemia, unspecified: Secondary | ICD-10-CM

## 2024-05-23 DIAGNOSIS — R030 Elevated blood-pressure reading, without diagnosis of hypertension: Secondary | ICD-10-CM | POA: Diagnosis not present

## 2024-05-23 DIAGNOSIS — D801 Nonfamilial hypogammaglobulinemia: Secondary | ICD-10-CM

## 2024-05-23 DIAGNOSIS — E559 Vitamin D deficiency, unspecified: Secondary | ICD-10-CM | POA: Diagnosis not present

## 2024-05-23 DIAGNOSIS — Z79899 Other long term (current) drug therapy: Secondary | ICD-10-CM

## 2024-05-23 DIAGNOSIS — R198 Other specified symptoms and signs involving the digestive system and abdomen: Secondary | ICD-10-CM

## 2024-05-23 DIAGNOSIS — R413 Other amnesia: Secondary | ICD-10-CM | POA: Diagnosis not present

## 2024-05-23 LAB — BASIC METABOLIC PANEL WITH GFR
BUN: 16 mg/dL (ref 6–23)
CO2: 28 meq/L (ref 19–32)
Calcium: 9.5 mg/dL (ref 8.4–10.5)
Chloride: 104 meq/L (ref 96–112)
Creatinine, Ser: 0.69 mg/dL (ref 0.40–1.20)
GFR: 83.86 mL/min (ref >60.00)
Glucose, Bld: 96 mg/dL (ref 70–99)
Potassium: 4.1 meq/L (ref 3.5–5.1)
Sodium: 140 meq/L (ref 135–145)

## 2024-05-23 LAB — HEPATIC FUNCTION PANEL
ALT: 14 U/L (ref 0–35)
AST: 17 U/L (ref 0–37)
Albumin: 4.4 g/dL (ref 3.5–5.2)
Alkaline Phosphatase: 63 U/L (ref 39–117)
Bilirubin, Direct: 0.1 mg/dL (ref 0.0–0.3)
Total Bilirubin: 0.4 mg/dL (ref 0.2–1.2)
Total Protein: 6.6 g/dL (ref 6.0–8.3)

## 2024-05-23 LAB — CBC WITH DIFFERENTIAL/PLATELET
Basophils Absolute: 0 10*3/uL (ref 0.0–0.1)
Basophils Relative: 0.5 % (ref 0.0–3.0)
Eosinophils Absolute: 0.1 10*3/uL (ref 0.0–0.7)
Eosinophils Relative: 1.3 % (ref 0.0–5.0)
HCT: 39.3 % (ref 36.0–46.0)
Hemoglobin: 13.1 g/dL (ref 12.0–15.0)
Lymphocytes Relative: 27.1 % (ref 12.0–46.0)
Lymphs Abs: 1.3 10*3/uL (ref 0.7–4.0)
MCHC: 33.4 g/dL (ref 30.0–36.0)
MCV: 90.2 fl (ref 78.0–100.0)
Monocytes Absolute: 0.5 10*3/uL (ref 0.1–1.0)
Monocytes Relative: 9.8 % (ref 3.0–12.0)
Neutro Abs: 2.9 10*3/uL (ref 1.4–7.7)
Neutrophils Relative %: 61.3 % (ref 43.0–77.0)
Platelets: 343 10*3/uL (ref 150.0–400.0)
RBC: 4.35 Mil/uL (ref 3.87–5.11)
RDW: 14.3 % (ref 11.5–15.5)
WBC: 4.8 10*3/uL (ref 4.0–10.5)

## 2024-05-23 NOTE — Patient Instructions (Addendum)
 Updated lab today  BP looks good  today   Bp control. goal is average below 130/80 range  as possible.   Continue lifestyle intervention healthy eating and exercise .  Attendance .

## 2024-05-23 NOTE — Progress Notes (Signed)
 Chief Complaint  Patient presents with   Medical Management of Chronic Issues    Pt is here for a follow up. Brought BP reading log. Recently on BP medication. Pt reports she was seen at ED. Went follow up with GI last wk. Pt reports they did find diverticulitis in the upper colons. Was seen with them for terrible taste and bad breathe.     HPI: Leslie Reed 78 y.o. come in for Chronic disease management   Fu 3 mos  for a number of issues Cognition concern Immunization ?s  BP readings and contrrol was place on low dose benicar  and tolerating VIt D was  Interim hx   divertic in SB duodinitis  and poss cause of  anemia ?  Also neuro psych referral per dr Gailen and atn mri tsting  neg screen    ROS: See pertinent positives and negatives per HPI. No current cp sob check ? Limp r lower back  but no sx  is very physically active   Past Medical History:  Diagnosis Date   Allergic rhinitis    Allergy    Arthritis    hands, lower back   Asthma    Bronchitis    uses inhaler prn   Cataract    just being monitored - no surgery   Colitis 8/04   Diverticulitis    GERD (gastroesophageal reflux disease)    diet controlled, no meds   History of migraine headaches    Hx of colonic polyp    Hx of varicella    Hyperlipidemia    Insomnia    Interstitial cystitis 10/90   Mitral valve prolapse    Osteopenia    Scoliosis 2005   Sleep apnea    Tick bite 2014   STARI-antibiotics x 5 weeks   Tracheobronchopathia-osteochondroplastica     Family History  Problem Relation Age of Onset   Diabetes Mother    Pulmonary fibrosis Mother        72 deceased   Hypertension Mother    Hypertension Father    Alzheimer's disease Father    Kidney failure Father    Diabetes Sister    Hypertension Brother    Sleep apnea Brother    Diabetes Maternal Grandmother    Colon cancer Maternal Grandfather    Colon polyps Daughter    Thyroid  disease Daughter    Psoriasis Daughter    Thyroid  disease  Daughter    Diabetes Son    Allergy (severe) Son    Depression Son    Esophageal cancer Neg Hx    Rectal cancer Neg Hx    Stomach cancer Neg Hx     Social History   Socioeconomic History   Marital status: Married    Spouse name: Not on file   Number of children: Not on file   Years of education: Not on file   Highest education level: Master's degree (e.g., MA, MS, MEng, MEd, MSW, MBA)  Occupational History   Not on file  Tobacco Use   Smoking status: Never    Passive exposure: Yes   Smokeless tobacco: Never  Vaping Use   Vaping status: Never Used  Substance and Sexual Activity   Alcohol use: Not Currently    Comment: occ   Drug use: No   Sexual activity: Not Currently    Partners: Male    Birth control/protection: Surgical, Post-menopausal    Comment: TAH  Other Topics Concern   Not on file  Social  History Narrative   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   hhof 2   G6 p4    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    [address] 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   Neg td    Sleep 4-6 interrupted    Family is WISC father with alzheimers   Social Drivers of Health   Financial Resource Strain: Low Risk  (04/17/2024)   Received from [facility]   Overall Financial Resource Strain (CARDIA)    Difficulty of Paying Living Expenses: Not hard at all  Food Insecurity: No Food Insecurity (05/01/2024)   Hunger Vital Sign    Worried About Running Out of Food in the Last Year: Never true    Ran Out of Food in the Last Year: Never true  Transportation Needs: No Transportation Needs (05/01/2024)   PRAPARE - Administrator, Civil Service (Medical): No    Lack of Transportation (Non-Medical): No  Physical Activity: Insufficiently Active (04/27/2022)   Exercise Vital Sign    Days of Exercise per Week: 1 day    Minutes of Exercise per Session: 30 min  Stress: Stress Concern Present (04/27/2022)   [employer] of Occupational Health - Occupational Stress  Questionnaire    Feeling of Stress : To some extent  Social Connections: Moderately Isolated (05/01/2024)   Social Connection and Isolation Panel    Frequency of Communication with Friends and Family: More than three times a week    Frequency of Social Gatherings with Friends and Family: More than three times a week    Attends Religious Services: Never    Database administrator or Organizations: No    Attends Banker Meetings: Never    Marital Status: Married    Outpatient Medications Prior to Visit  Medication Sig Dispense Refill   calcium  carbonate (TUMS - DOSED IN MG ELEMENTAL CALCIUM ) 500 MG chewable tablet Chew 2 tablets by mouth as needed for indigestion or heartburn.     carboxymethylcellulose (REFRESH PLUS) 0.5 % SOLN Place 1 drop into both eyes as needed.     Chlorphen-Pseudoephed-APAP (TYLENOL  ALLERGY SINUS PO) Take 1-2 tablets by mouth as needed.     Cholecalciferol (VITAMIN D3) 50 MCG (2000 UT) capsule Take 2 capsules (4,000 Units total) by mouth daily. 60 capsule 3   cyanocobalamin  (VITAMIN B12) 1000 MCG tablet Take 1,000 mcg by mouth daily.     Dihydroxyaluminum Sod Carb (ROLAIDS PO) Take 2 tablets by mouth as needed.     melatonin 3 MG TABS tablet Take 3 mg by mouth at bedtime.     meloxicam  (MOBIC ) 15 MG tablet Take 1 tablet (15 mg total) by mouth daily. Take for 1 week, then as needed. (Patient taking differently: Take 15 mg by mouth as needed. Take for 1 week, then as needed.) 30 tablet 3   Minoxidil (ROGAINE EX) Apply 1 Application topically as needed.     olmesartan (BENICAR) 5 MG tablet Take 5 mg by mouth daily.     omeprazole  (PRILOSEC) 40 MG capsule Take 1 capsule (40 mg total) by mouth daily. 30 capsule 5   Respiratory Therapy Supplies (FLUTTER) DEVI 1 each by Does not apply route 2 (two) times daily. 1 each 0   UNABLE TO FIND Apply 1 Application topically as needed. Mentholatum Ointment     Olopatadine-Mometasone (RYALTRIS ) 665-25 MCG/ACT SUSP Place 1-2  sprays into the nose in the morning and at bedtime. (Patient not taking: Reported on 05/23/2024) 29  g 5   ondansetron  (ZOFRAN -ODT) 4 MG disintegrating tablet Take 1 tablet (4 mg total) by mouth every 6 (six) hours as needed for nausea. (Patient not taking: Reported on 05/23/2024) 20 tablet 0   traMADol  (ULTRAM ) 50 MG tablet Take 1 tablet (50 mg total) by mouth every 6 (six) hours as needed for moderate pain (pain score 4-6). (Patient not taking: Reported on 05/23/2024) 30 tablet 0   Facility-Administered Medications Prior to Visit  Medication Dose Route Frequency Provider Last Rate Last Admin   0.9 %  sodium chloride  infusion  500 mL Intravenous Continuous Nandigam, Kavitha V, MD         EXAM:  BP 120/72 (BP Location: Right Arm, Patient Position: Sitting, Cuff Size: Normal)   Pulse 73   Temp 97.7 F (36.5 C)   Ht 5' 4 (1.626 m)   Wt 136 lb (61.7 kg)   LMP 11/29/1986   SpO2 98%   BMI 23.34 kg/m   Body mass index is 23.34 kg/m.  GENERAL: vitals reviewed and listed above, alert, oriented, appears well hydrated and in no acute distress HEENT: atraumatic, conjunctiva  clear, no obvious abnormalities on inspection of external nose and ears NECK: no obvious masses on inspection palpation  LUNGS: clear to auscultation bilaterally, no wheezes, rales or rhonchi, good air movement CV: HRRR, no clubbing cyanosis or  peripheral edema nl cap refill  No obv mass back but spine  paraspinal  prominence ? M spasm  MS: moves all extremities without noticeable focal  abnormality PSYCH: pleasant and cooperative, no obvious depression or anxiety Lab Results  Component Value Date   WBC 4.8 05/23/2024   HGB 13.1 05/23/2024   HCT 39.3 05/23/2024   PLT 343.0 05/23/2024   GLUCOSE 96 05/23/2024   CHOL 200 02/07/2024   TRIG 85.0 02/07/2024   HDL 64.00 02/07/2024   LDLDIRECT 138.0 10/13/2011   LDLCALC 119 (H) 02/07/2024   ALT 14 05/23/2024   AST 17 05/23/2024   NA 140 05/23/2024   K 4.1 05/23/2024    CL 104 05/23/2024   CREATININE 0.69 05/23/2024   BUN 16 05/23/2024   CO2 28 05/23/2024   TSH 1.240 04/26/2024   HGBA1C 5.7 02/07/2024   BP Readings from Last 3 Encounters:  05/23/24 120/72  05/15/24 (!) 108/58  05/06/24 (!) 169/72   Last vitamin D  Lab Results  Component Value Date   VD25OH 26.79 (L) 02/07/2024    ASSESSMENT AND PLAN:  Discussed the following assessment and plan:  Medication management - Plan: CBC with Differential/Platelet, Calcium , ionized, Basic Metabolic Panel, Hepatic Function Panel  Essential hypertension  Vitamin D  deficiency  Memory difficulty  Elevated blood pressure reading - Plan: CBC with Differential/Platelet, Calcium , ionized, Basic Metabolic Panel, Hepatic Function Panel  Low calcium  levels - Plan: CBC with Differential/Platelet, Calcium , ionized, Basic Metabolic Panel, Hepatic Function Panel  Mild anemia - Plan: CBC with Differential/Platelet, Calcium , ionized, Basic Metabolic Panel, Hepatic Function Panel  GI symptom - duodinitis plus ibs  under monintoring and GI fu Updating lab    mild anemia calcium  level Cont vit d  bp  at goal  Record review   interim hx  and fu.  Counsel plan record  32 minutes  Gi fu as  planned  Keep  neuro psych neuro fu  .  -Patient advised to return or notify health care team  if  new concerns arise. In interim  Patient Instructions   Updated lab today  BP looks good  today  Bp control. goal is average below 130/80 range  as possible.   Continue lifestyle intervention healthy eating and exercise .  Attendance .   Madeliene Tejera K. Tesla Keeler M.D.

## 2024-05-24 ENCOUNTER — Ambulatory Visit: Admitting: Gastroenterology

## 2024-05-24 LAB — CALCIUM, IONIZED: Calcium, Ion: 5.2 mg/dL (ref 4.7–5.5)

## 2024-05-27 ENCOUNTER — Ambulatory Visit: Payer: Self-pay | Admitting: Internal Medicine

## 2024-05-27 NOTE — Progress Notes (Signed)
 Anemia calcium  and chemistry now normal . All reassuring

## 2024-06-14 ENCOUNTER — Telehealth: Payer: Self-pay | Admitting: Neurology

## 2024-06-14 NOTE — Telephone Encounter (Signed)
 Received fax message: Patient consult with Dr. Maude Rattler has been scheduled on 11/13/24 at 8:30 am

## 2024-06-19 ENCOUNTER — Encounter: Payer: Self-pay | Admitting: Gastroenterology

## 2024-06-27 ENCOUNTER — Emergency Department (HOSPITAL_BASED_OUTPATIENT_CLINIC_OR_DEPARTMENT_OTHER)
Admission: EM | Admit: 2024-06-27 | Discharge: 2024-06-27 | Disposition: A | Discharge status: Home / Self Care | Source: Ambulatory Visit | Attending: Emergency Medicine | Admitting: Emergency Medicine

## 2024-06-27 ENCOUNTER — Other Ambulatory Visit: Payer: Self-pay

## 2024-06-27 ENCOUNTER — Encounter: Payer: Self-pay | Admitting: Gastroenterology

## 2024-06-27 ENCOUNTER — Encounter (HOSPITAL_BASED_OUTPATIENT_CLINIC_OR_DEPARTMENT_OTHER): Payer: Self-pay

## 2024-06-27 ENCOUNTER — Emergency Department (HOSPITAL_BASED_OUTPATIENT_CLINIC_OR_DEPARTMENT_OTHER)

## 2024-06-27 ENCOUNTER — Other Ambulatory Visit (HOSPITAL_COMMUNITY): Payer: Self-pay

## 2024-06-27 ENCOUNTER — Telehealth (HOSPITAL_COMMUNITY): Payer: Self-pay | Admitting: Pharmacy Technician

## 2024-06-27 ENCOUNTER — Ambulatory Visit: Admitting: Gastroenterology

## 2024-06-27 VITALS — BP 95/60 | HR 93 | Temp 97.3°F | Ht 64.0 in | Wt 136.0 lb

## 2024-06-27 DIAGNOSIS — Z79899 Other long term (current) drug therapy: Secondary | ICD-10-CM | POA: Diagnosis not present

## 2024-06-27 DIAGNOSIS — I4891 Unspecified atrial fibrillation: Secondary | ICD-10-CM | POA: Diagnosis not present

## 2024-06-27 DIAGNOSIS — I10 Essential (primary) hypertension: Secondary | ICD-10-CM | POA: Diagnosis not present

## 2024-06-27 DIAGNOSIS — R002 Palpitations: Secondary | ICD-10-CM | POA: Diagnosis present

## 2024-06-27 LAB — BASIC METABOLIC PANEL WITH GFR
Anion gap: 11 (ref 5–15)
BUN: 18 mg/dL (ref 8–23)
CO2: 24 mmol/L (ref 22–32)
Calcium: 9.7 mg/dL (ref 8.9–10.3)
Chloride: 107 mmol/L (ref 98–111)
Creatinine, Ser: 0.73 mg/dL (ref 0.44–1.00)
GFR, Estimated: >60 mL/min (ref >60)
Glucose, Bld: 121 mg/dL — ABNORMAL HIGH (ref 70–99)
Potassium: 4.4 mmol/L (ref 3.5–5.1)
Sodium: 142 mmol/L (ref 135–145)

## 2024-06-27 LAB — CBC
HCT: 42.3 % (ref 36.0–46.0)
Hemoglobin: 14.3 g/dL (ref 12.0–15.0)
MCH: 30.5 pg (ref 26.0–34.0)
MCHC: 33.8 g/dL (ref 30.0–36.0)
MCV: 90.2 fL (ref 80.0–100.0)
Platelets: 306 K/uL (ref 150–400)
RBC: 4.69 MIL/uL (ref 3.87–5.11)
RDW: 13.5 % (ref 11.5–15.5)
WBC: 6.3 K/uL (ref 4.0–10.5)
nRBC: 0 % (ref 0.0–0.2)

## 2024-06-27 LAB — TSH: TSH: 1.35 u[IU]/mL (ref 0.350–4.500)

## 2024-06-27 LAB — MAGNESIUM: Magnesium: 2.2 mg/dL (ref 1.7–2.4)

## 2024-06-27 MED ORDER — SODIUM CHLORIDE 0.9 % IV BOLUS
500.0000 mL | Freq: Once | INTRAVENOUS | Status: AC
  Administered 2024-06-27: 500 mL via INTRAVENOUS

## 2024-06-27 MED ORDER — METOPROLOL SUCCINATE ER 50 MG PO TB24: 50.0000 mg | ORAL_TABLET | Freq: Every day | ORAL | 1 refills | Status: DC

## 2024-06-27 MED ORDER — DILTIAZEM HCL 25 MG/5ML IV SOLN
10.0000 mg | Freq: Once | INTRAVENOUS | Status: AC
  Administered 2024-06-27: 10 mg via INTRAVENOUS
  Filled 2024-06-27: qty 5

## 2024-06-27 MED ORDER — SODIUM CHLORIDE 0.9 % IV SOLN: 500.0000 mL | Freq: Once | INTRAVENOUS | Status: DC

## 2024-06-27 MED ORDER — APIXABAN 2.5 MG PO TABS
5.0000 mg | ORAL_TABLET | Freq: Two times a day (BID) | ORAL | Status: DC
  Administered 2024-06-27: 5 mg via ORAL
  Filled 2024-06-27: qty 2

## 2024-06-27 MED ORDER — APIXABAN 5 MG PO TABS: 5.0000 mg | ORAL_TABLET | Freq: Two times a day (BID) | ORAL | 0 refills | Status: DC

## 2024-06-27 MED ORDER — METOPROLOL SUCCINATE ER 25 MG PO TB24
50.0000 mg | ORAL_TABLET | Freq: Once | ORAL | Status: AC
  Administered 2024-06-27: 50 mg via ORAL
  Filled 2024-06-27 (×2): qty 2

## 2024-06-27 NOTE — Discharge Instructions (Addendum)
 Your workup today did not show any serious problems, other than the atrial fibrillation.  The heart rate is better controlled after medications.  Please do not take the olmesartan unless you are told to restart by your doctor.  If you feel significantly lightheaded, especially with standing, please take 1/2 dose (25mg ) of metoprolol  xl.      Information on my medicine - ELIQUIS  (apixaban )  This medication education was reviewed with me or my healthcare representative as part of my discharge preparation.   Why was Eliquis  prescribed for you? Eliquis  was prescribed for you to reduce the risk of a blood clot forming that can cause a stroke if you have a medical condition called atrial fibrillation (a type of irregular heartbeat).  What do You need to know about Eliquis  ? Take your Eliquis  TWICE DAILY - one tablet in the morning and one tablet in the evening with or without food. If you have difficulty swallowing the tablet whole please discuss with your pharmacist how to take the medication safely.  Take Eliquis  exactly as prescribed by your doctor and DO NOT stop taking Eliquis  without talking to the doctor who prescribed the medication.  Stopping may increase your risk of developing a stroke.  Refill your prescription before you run out.  After discharge, you should have regular check-up appointments with your healthcare provider that is prescribing your Eliquis .  In the future your dose may need to be changed if your kidney function or weight changes by a significant amount or as you get older.  What do you do if you miss a dose? If you miss a dose, take it as soon as you remember on the same day and resume taking twice daily.  Do not take more than one dose of ELIQUIS  at the same time to make up a missed dose.  Important Safety Information A possible side effect of Eliquis  is bleeding. You should call your healthcare provider right away if you experience any of the  following: Bleeding from an injury or your nose that does not stop. Unusual colored urine (red or dark brown) or unusual colored stools (red or black). Unusual bruising for unknown reasons. A serious fall or if you hit your head (even if there is no bleeding).  Some medicines may interact with Eliquis  and might increase your risk of bleeding or clotting while on Eliquis . To help avoid this, consult your healthcare provider or pharmacist prior to using any new prescription or non-prescription medications, including herbals, vitamins, non-steroidal anti-inflammatory drugs (NSAIDs) and supplements.  This website has more information on Eliquis  (apixaban ): http://[email or web address] .com/eliquis dena

## 2024-06-27 NOTE — Telephone Encounter (Signed)
 Patient Product/process development scientist completed.    The patient is insured through [facility]. Patient has Medicare and is not eligible for a copay card, but may be able to apply for patient assistance or Medicare RX Payment Plan (Patient Must reach out to their plan, if eligible for payment plan), if available.    Ran test claim for Eliquis 5 mg and the current 30 day co-pay is $302.00 due to a $255.00 deductible.  Will be $47.00 once deductible is met.   This test claim was processed through [facility]- copay amounts may vary at other pharmacies due to pharmacy/plan contracts, or as the patient moves through the different stages of their insurance plan.     Roland Earl, CPHT Pharmacy Technician III Certified Patient Advocate [facility] Patient Advocate Team Direct Number: [phone]  Fax: [phone]

## 2024-06-27 NOTE — ED Provider Notes (Signed)
 [facility] EMERGENCY DEPARTMENT AT [facility] Provider Note   CSN: [id number] Arrival date & time: 06/27/24  1014     Patient presents with: Abnormal ECG   Leslie Reed is a 78 y.o. female.   Patient with history of hypertension on olmesartan, recent admission for GI inflammation --presents to the emergency department today for evaluation of atrial fibrillation with RVR.  This is a new diagnosis for the patient.  She was at the GI office this morning with plans to have an endoscopy after her recent hospitalization.  She was found to have elevated heart rate, irregular rate, 120-140.  Patient is essentially asymptomatic.  She is continued to be very active and has not had any fatigue, shortness of breath, or decreased exercise tolerance.  No lightheadedness or syncope.  No shortness of breath or leg swelling.  No chest pain.  Patient has been n.p.o. today.  She does endorse about 2 cups of coffee per day.  No current GI bleeding.  No history of diabetes.  No history of heart failure.       Prior to Admission medications   Medication Sig Start Date End Date Taking? Authorizing Provider  calcium  carbonate (TUMS - DOSED IN MG ELEMENTAL CALCIUM ) 500 MG chewable tablet Chew 2 tablets by mouth as needed for indigestion or heartburn.    [provider]  carboxymethylcellulose (REFRESH PLUS) 0.5 % SOLN Place 1 drop into both eyes as needed.    [provider]  Chlorphen-Pseudoephed-APAP (TYLENOL  ALLERGY SINUS PO) Take 1-2 tablets by mouth as needed.    [provider]  Cholecalciferol (VITAMIN D3) 50 MCG (2000 UT) capsule Take 2 capsules (4,000 Units total) by mouth daily. 02/07/24   Panosh, Wanda K, MD  cyanocobalamin  (VITAMIN B12) 1000 MCG tablet Take 1,000 mcg by mouth daily.    [provider]  Dihydroxyaluminum Sod Carb (ROLAIDS PO) Take 2 tablets by mouth as needed.    [provider]  melatonin 3 MG TABS tablet Take 3 mg by mouth at  bedtime.    [provider]  meloxicam  (MOBIC ) 15 MG tablet Take 1 tablet (15 mg total) by mouth daily. Take for 1 week, then as needed. Patient taking differently: Take 15 mg by mouth as needed. Take for 1 week, then as needed. 02/09/24   Harvey Seltzer, MD  Minoxidil (ROGAINE EX) Apply 1 Application topically as needed.    [provider]  olmesartan (BENICAR) 5 MG tablet Take 5 mg by mouth daily. 04/17/24   [provider]  Olopatadine-Mometasone (RYALTRIS ) 665-25 MCG/ACT SUSP Place 1-2 sprays into the nose in the morning and at bedtime. Patient not taking: Reported on 05/23/2024 04/18/24   Luke Orlan HERO, DO  omeprazole  (PRILOSEC) 40 MG capsule Take 1 capsule (40 mg total) by mouth daily. 05/15/24   Zehr, Jessica D, PA-C  ondansetron  (ZOFRAN -ODT) 4 MG disintegrating tablet Take 1 tablet (4 mg total) by mouth every 6 (six) hours as needed for nausea. Patient not taking: Reported on 05/23/2024 05/06/24   Belinda Cough, MD  Respiratory Therapy Supplies (FLUTTER) DEVI 1 each by Does not apply route 2 (two) times daily. 04/01/20   Gretta Leita SQUIBB, DO  traMADol  (ULTRAM ) 50 MG tablet Take 1 tablet (50 mg total) by mouth every 6 (six) hours as needed for moderate pain (pain score 4-6). Patient not taking: Reported on 05/23/2024 05/06/24   Belinda Cough, MD  UNABLE TO FIND Apply 1 Application topically as needed. Mentholatum Ointment  [provider]    Allergies: Patient has no known allergies.    Review of Systems  Updated Vital Signs BP 110/61 (BP Location: Right Arm)   Pulse 79   Temp 97.8 F (36.6 C) (Oral)   Resp 20   LMP 11/29/1986   SpO2 97%   Physical Exam Vitals and nursing note reviewed.  Constitutional:      General: She is not in acute distress.    Appearance: She is well-developed.  HENT:     Head: Normocephalic and atraumatic.     Right Ear: External ear normal.     Left Ear: External ear normal.     Nose: Nose normal.     Mouth/Throat:      Mouth: Mucous membranes are moist.  Eyes:     Conjunctiva/sclera: Conjunctivae normal.  Cardiovascular:     Rate and Rhythm: Tachycardia present. Rhythm regularly irregular.     Heart sounds: No murmur heard. Pulmonary:     Effort: No respiratory distress.     Breath sounds: No wheezing, rhonchi or rales.     Comments: Lungs are clear to auscultation bilaterally Abdominal:     Palpations: Abdomen is soft.     Tenderness: There is no abdominal tenderness. There is no guarding or rebound.  Musculoskeletal:     Cervical back: Normal range of motion and neck supple.     Right lower leg: No edema.     Left lower leg: No edema.  Skin:    General: Skin is warm and dry.     Findings: No rash.  Neurological:     General: No focal deficit present.     Mental Status: She is alert. Mental status is at baseline.     Motor: No weakness.  Psychiatric:        Mood and Affect: Mood normal.     (all labs ordered are listed, but only abnormal results are displayed) Labs Reviewed  BASIC METABOLIC PANEL WITH GFR - Abnormal; Notable for the following components:      Result Value   Glucose, Bld 121 (*)    All other components within normal limits  MAGNESIUM  CBC  TSH    EKG: EKG Interpretation Date/Time:  Wednesday June 27 2024 10:26:31 EDT Ventricular Rate:  127 PR Interval:    QRS Duration:  77 QT Interval:  329 QTC Calculation: 479 R Axis:   75  Text Interpretation: Atrial fibrillation Borderline repol abnormality, diffuse leads Confirmed by Lenor Hollering [id number]) on 06/27/2024 10:42:59 AM  Radiology: [facility] Chest Portable 1 View Result Date: 06/27/2024 CLINICAL DATA:  new afib rvr. EXAM: PORTABLE CHEST 1 VIEW COMPARISON:  05/01/2024. FINDINGS: Bilateral lung fields are clear. Bilateral costophrenic angles are clear. Normal cardio-mediastinal silhouette. No acute osseous abnormalities. The soft tissues are within normal limits. IMPRESSION: No active disease. Electronically Signed   By:  Ree Molt M.D.   On: 06/27/2024 11:01     Procedures   Medications Ordered in the ED  diltiazem  (CARDIZEM ) injection 10 mg (has no administration in time range)   ED Course  Patient seen and examined. History obtained directly from patient.   Labs/EKG: Ordered CBC, BMP, magnesium, TSH.  EKG personally reviewed and interpreted, consistent with atrial fibrillation with RVR.  Imaging: Ordered chest x-ray.  Medications/Fluids: Ordered: Fluid bolus, Cardizem  10 mg IV.   Most recent vital signs reviewed and are as follows: BP 110/61 (BP Location: Right Arm)   Pulse 79   Temp 97.8 F (36.6  C) (Oral)   Resp 20   LMP 11/29/1986   SpO2 97%   Initial impression: Asymptomatic atrial fibrillation with RVR, rates are close to goal.  Will see how she responds to IV Cardizem .  11:33 AM HR 80-110. Last BP 99/73. Discussed with Dr. Lenor. Will talk to cardiology about reccs. She can probably follow-up as outpatient.   Lab work personally reviewed and interpreted including CBC which was unremarkable; BMP with glucose 121 otherwise unremarkable; magnesium normal; TSH normal.  I discussed case with Dr. [place] of cardiology.  Recommend starting 50 mg Toprol -XL for rate control.  Agrees with anticoagulation, A-fib clinic follow-up.  Referral has been placed.  1:11 PM HR 90-100, BP 99/71.   Blood pressure has been soft, however patient has tolerated heart rate and blood pressure very well.  Metoprolol  was extended release.  Will go ahead and give this.  She has received 1 L IV fluid bolus.  Plan for discharge.  Had a discussion with patient and family regarding atrial fibrillation.  Stroke risk, anticoagulation.  Discussed bleeding risks, signs and symptoms to monitor for.  Also discussed need to take injury seriously.  CRITICAL CARE Performed by: Fonda Ruby PA-C Total critical care time: 35 minutes Critical care time was exclusive of separately billable procedures and treating other  patients. Critical care was necessary to treat or prevent imminent or life-threatening deterioration. Critical care was time spent personally by me on the following activities: development of treatment plan with patient and/or surrogate as well as nursing, discussions with consultants, evaluation of patient's response to treatment, examination of patient, obtaining history from patient or surrogate, ordering and performing treatments and interventions, ordering and review of laboratory studies, ordering and review of radiographic studies, pulse oximetry and re-evaluation of patient's condition.                                   Medical Decision Making Amount and/or Complexity of Data Reviewed Labs: ordered. Radiology: ordered.  Risk Prescription drug management.   Patient with new diagnosis of atrial fibrillation with RVR.  Patient was found to be in A-fib prior to her endoscopy today.  She tolerates it extremely well.  Blood pressures have been soft, treated with IV fluids, patient asymptomatic.  She did well with IV Cardizem  in regards to rate which is now controlled.  She will be started on oral metoprolol .  Will have patient follow-up with afib clinic.  Lab workup is reassuring.  She is reliable to follow-up as outpatient.  The patient's vital signs, pertinent lab work and imaging were reviewed and interpreted as discussed in the ED course. Hospitalization was considered for further testing, treatments, or serial exams/observation. However as patient is well-appearing, has a stable exam, and reassuring studies today, I do not feel that they warrant admission at this time. This plan was discussed with the patient who verbalizes agreement and comfort with this plan and seems reliable and able to return to the Emergency Department with worsening or changing symptoms.       Final diagnoses:  Atrial fibrillation with RVR [facility])    ED Discharge Orders          Ordered    Amb Referral to  AFIB Clinic        06/27/24 1130    apixaban  (ELIQUIS ) 5 MG TABS tablet  2 times daily        06/27/24 1329  metoprolol  succinate (TOPROL  XL) 50 MG 24 hr tablet  Daily        06/27/24 1329               Desiderio Chew, PA-C 06/27/24 1420    Lenor Hollering, MD 06/28/24 0710

## 2024-06-27 NOTE — ED Triage Notes (Signed)
 Patient was supposed to be getting an endoscopy when they did an EKG showing A-fib with RVR. Patient feels fine and has no symptoms. She reports no history of this.

## 2024-06-27 NOTE — Progress Notes (Signed)
 PHARMACY - ANTICOAGULATION CONSULT NOTE  Pharmacy Consult for apixaban  Indication: atrial fibrillation  No Known Allergies  Patient Measurements:    Vital Signs: Temp: 97.8 F (36.6 C) (07/30 1020) Temp Source: Oral (07/30 1020) BP: 107/90 (07/30 1100) Pulse Rate: 79 (07/30 1020)  Labs: Recent Labs    06/27/24 1051  HGB 14.3  HCT 42.3  PLT 306  CREATININE 0.73    Estimated Creatinine Clearance: 50.9 mL/min (by C-G formula based on SCr of 0.73 mg/dL).   Medical History: Past Medical History:  Diagnosis Date   Allergic rhinitis    Allergy    Arthritis    hands, lower back   Asthma    Bronchitis    uses inhaler prn   Cataract    just being monitored - no surgery   Colitis 8/04   Diverticulitis    GERD (gastroesophageal reflux disease)    diet controlled, no meds   History of migraine headaches    Hx of colonic polyp    Hx of varicella    Hyperlipidemia    Insomnia    Interstitial cystitis 10/90   Mitral valve prolapse    Osteopenia    Scoliosis 2005   Sleep apnea    Tick bite 2014   STARI-antibiotics x 5 weeks   Tracheobronchopathia-osteochondroplastica    Assessment: 39 yof presented to the ED with new afib. To start apixaban . Baseline CBC and renal function are WNL. She is not on anticoagulation PTA.  Goal of Therapy:  Stroke prevention Monitor platelets by anticoagulation protocol: Yes   Plan:  Apixaban  5mg  PO BID F/u S&S of bleeding   Mackinze Criado, Vernell Helling 06/27/2024,11:40 AM

## 2024-06-27 NOTE — ED Notes (Signed)
 Spoke with PA regarding po Metoprolol . Okay to give medication and monitor BP before dc.

## 2024-06-27 NOTE — ED Notes (Signed)
 Spoke with PA regarding metoprolol  and pts BP. Giving 500mL NS bolus then reassess BP.

## 2024-06-27 NOTE — Progress Notes (Unsigned)
 Notified by CMA about irregular HR fluctuating between 90's to 120's while getting vital signs. During recheck HR up to 140's. Patient denies any symptoms. EKG showed irregular rhythm with a-fib. Dr. Nandigam and [facility] CRNA notified. Patient instructed by MD to go to ER.

## 2024-07-02 ENCOUNTER — Ambulatory Visit (HOSPITAL_COMMUNITY)
Admission: RE | Admit: 2024-07-02 | Discharge: 2024-07-02 | Disposition: A | Discharge status: Home / Self Care | Source: Ambulatory Visit | Attending: Internal Medicine | Admitting: Internal Medicine

## 2024-07-02 VITALS — BP 134/70 | HR 58 | Ht 64.0 in | Wt 143.2 lb

## 2024-07-02 DIAGNOSIS — D6869 Other thrombophilia: Secondary | ICD-10-CM

## 2024-07-02 DIAGNOSIS — I4891 Unspecified atrial fibrillation: Secondary | ICD-10-CM | POA: Diagnosis not present

## 2024-07-02 DIAGNOSIS — I480 Paroxysmal atrial fibrillation: Secondary | ICD-10-CM | POA: Diagnosis not present

## 2024-07-02 MED ORDER — APIXABAN 5 MG PO TABS: 5.0000 mg | ORAL_TABLET | Freq: Two times a day (BID) | ORAL | 6 refills | Status: DC

## 2024-07-02 MED ORDER — METOPROLOL SUCCINATE ER 50 MG PO TB24: 50.0000 mg | ORAL_TABLET | Freq: Every day | ORAL | 6 refills | Status: DC

## 2024-07-02 NOTE — Patient Instructions (Signed)
 Get lab work in 1 month 08/02/24 at any [facility]. ( Attached lab slips )

## 2024-07-02 NOTE — Progress Notes (Signed)
 Primary Care Physician: Charlett Apolinar POUR, MD Primary Cardiologist: None Electrophysiologist: None     Referring Physician: ED     Leslie Reed is a 78 y.o. female with a history of HTN, HLD, OSA on CPAP, and atrial fibrillation who presents for consultation in the [facility]. ED visit on 06/27/24 for new onset Afib with RVR found at GI office appointment. Started on Toprol  50 mg daily. Patient is on Eliquis  5 mg BID for a CHADS2VASC score of 4.  On evaluation today, patient is currently in NSR. Patient notes to have been dehydrated the day before episode. She admits to not drinking lots of water. She wears CPAP every night for at least 4 hours. She did not have cardiac awareness. No alcohol.   Today, she denies symptoms of palpitations, chest pain, shortness of breath, orthopnea, PND, lower extremity edema, dizziness, presyncope, syncope, snoring, daytime somnolence, bleeding, or neurologic sequela. The patient is tolerating medications without difficulties and is otherwise without complaint today.    Atrial Fibrillation Risk Factors:  she does have symptoms or diagnosis of sleep apnea. she is compliant with CPAP therapy.   she has a BMI of Body mass index is 24.58 kg/m.SABRA Filed Weights   07/02/24 1347  Weight: 65 kg    Current Outpatient Medications  Medication Sig Dispense Refill   apixaban  (ELIQUIS ) 5 MG TABS tablet Take 1 tablet (5 mg total) by mouth 2 (two) times daily. 60 tablet 0   calcium  carbonate (TUMS - DOSED IN MG ELEMENTAL CALCIUM ) 500 MG chewable tablet Chew 2 tablets by mouth as needed for indigestion or heartburn.     carboxymethylcellulose (REFRESH PLUS) 0.5 % SOLN Place 1 drop into both eyes as needed.     Chlorphen-Pseudoephed-APAP (TYLENOL  ALLERGY SINUS PO) Take 1-2 tablets by mouth as needed.     Cholecalciferol (VITAMIN D3) 50 MCG (2000 UT) capsule Take 2 capsules (4,000 Units total) by mouth daily. 60 capsule 3    cyanocobalamin  (VITAMIN B12) 1000 MCG tablet Take 1,000 mcg by mouth daily.     Dihydroxyaluminum Sod Carb (ROLAIDS PO) Take 2 tablets by mouth as needed.     melatonin 3 MG TABS tablet Take 3 mg by mouth at bedtime. (Patient taking differently: Take 3 mg by mouth as needed.)     meloxicam  (MOBIC ) 15 MG tablet Take 1 tablet (15 mg total) by mouth daily. Take for 1 week, then as needed. (Patient taking differently: Take 15 mg by mouth as needed. Take for 1 week, then as needed.) 30 tablet 3   metoprolol  succinate (TOPROL  XL) 50 MG 24 hr tablet Take 1 tablet (50 mg total) by mouth daily. Take with or immediately following a meal. 30 tablet 1   Minoxidil (ROGAINE EX) Apply 1 Application topically as needed.     Olopatadine-Mometasone (RYALTRIS ) 665-25 MCG/ACT SUSP Place 1-2 sprays into the nose in the morning and at bedtime. 29 g 5   omeprazole  (PRILOSEC) 40 MG capsule Take 1 capsule (40 mg total) by mouth daily. 30 capsule 5   Respiratory Therapy Supplies (FLUTTER) DEVI 1 each by Does not apply route 2 (two) times daily. 1 each 0   UNABLE TO FIND Apply 1 Application topically as needed. Mentholatum Ointment     olmesartan (BENICAR) 5 MG tablet Take 5 mg by mouth daily. (Patient not taking: Reported on 07/02/2024)     Current Facility-Administered Medications  Medication Dose Route Frequency Provider Last Rate Last Admin  0.9 %  sodium chloride  infusion  500 mL Intravenous Continuous Nandigam, Kavitha V, MD       0.9 %  sodium chloride  infusion  500 mL Intravenous Once Nandigam, Kavitha V, MD        Atrial Fibrillation Management history:  Previous antiarrhythmic drugs: none Previous cardioversions: none Previous ablations: none Anticoagulation history: Eliquis    ROS- All systems are reviewed and negative except as per the HPI above.  Physical Exam: BP 134/70   Pulse (!) 58   Ht 5' 4 (1.626 m)   Wt 65 kg   LMP 11/29/1986   BMI 24.58 kg/m   GEN: Well nourished, well developed in no  acute distress NECK: No JVD; No carotid bruits CARDIAC: Regular rate and rhythm, no murmurs, rubs, gallops RESPIRATORY:  Clear to auscultation without rales, wheezing or rhonchi  ABDOMEN: Soft, non-tender, non-distended EXTREMITIES:  No edema; No deformity   EKG today demonstrates  Vent. rate 58 BPM PR interval 168 ms QRS duration 84 ms QT/QTcB 436/428 ms P-R-T axes 61 58 59 Sinus bradycardia Otherwise normal ECG When compared with ECG of 27-Jun-2024 11:06, PREVIOUS ECG IS PRESENT  Echo 03/18/20 demonstrated  1. Left ventricular ejection fraction, by estimation, is 60 to 65%. Left  ventricular ejection fraction by 3D volume is 61 %. The left ventricle has  normal function. The left ventricle has no regional wall motion  abnormalities. Left ventricular diastolic   parameters were normal.   2. Right ventricular systolic function is normal. The right ventricular  size is normal. There is normal pulmonary artery systolic pressure. The  estimated right ventricular systolic pressure is 19.8 mmHg.   3. The mitral valve is grossly normal. Mild mitral valve regurgitation.  No evidence of mitral stenosis.   4. The aortic valve is tricuspid. Aortic valve regurgitation is mild. No  aortic stenosis is present.   5. The inferior vena cava is normal in size with greater than 50%  respiratory variability, suggesting right atrial pressure of 3 mmHg.   ASSESSMENT & PLAN CHA2DS2-VASc Score = 4  The patient's score is based upon: CHF History: 0 HTN History: 1 Diabetes History: 0 Stroke History: 0 Vascular Disease History: 0 Age Score: 2 Gender Score: 1       ASSESSMENT AND PLAN: Paroxysmal Atrial Fibrillation (ICD10:  I48.0) The patient's CHA2DS2-VASc score is 4, indicating a 4.8% annual risk of stroke.    She is currently in NSR. Education provided about Afib. Discussion about triggers for Afib. After discussion, we will proceed with conservative observation at this time. Rhythm  monitoring device recommended. Continue Toprol  50 mg daily.   Secondary Hypercoagulable State (ICD10:  D68.69) The patient is at significant risk for stroke/thromboembolism based upon her CHA2DS2-VASc Score of 4.  Continue Apixaban  (Eliquis ).  Continue Eliquis  5 mg BID. Will give order to draw CBC in 1 month.  She is able to hold Eliquis  as soon as 1 month from ED visit for endoscopy.    Follow up 6 months Afib clinic.    Terra Pac, PA-C  Afib Clinic [facility] [address] [place], [place] [address] [phone]

## 2024-07-03 ENCOUNTER — Ambulatory Visit (INDEPENDENT_AMBULATORY_CARE_PROVIDER_SITE_OTHER): Admitting: Family Medicine

## 2024-07-03 ENCOUNTER — Encounter: Payer: Self-pay | Admitting: Family Medicine

## 2024-07-03 VITALS — BP 128/80 | HR 56 | Resp 16 | Ht 64.0 in | Wt 143.0 lb

## 2024-07-03 DIAGNOSIS — M25561 Pain in right knee: Secondary | ICD-10-CM | POA: Diagnosis not present

## 2024-07-03 DIAGNOSIS — G8929 Other chronic pain: Secondary | ICD-10-CM

## 2024-07-03 DIAGNOSIS — E569 Vitamin deficiency, unspecified: Secondary | ICD-10-CM

## 2024-07-03 DIAGNOSIS — I480 Paroxysmal atrial fibrillation: Secondary | ICD-10-CM | POA: Diagnosis not present

## 2024-07-03 DIAGNOSIS — K219 Gastro-esophageal reflux disease without esophagitis: Secondary | ICD-10-CM

## 2024-07-03 DIAGNOSIS — I10 Essential (primary) hypertension: Secondary | ICD-10-CM | POA: Diagnosis not present

## 2024-07-03 DIAGNOSIS — M25562 Pain in left knee: Secondary | ICD-10-CM

## 2024-07-03 NOTE — Progress Notes (Signed)
 HPI: [name] is a 78 y.o. female  with PMHx significant for HLD, IBS, reflux esophagitis, OSA on CPAP, asthma, among some, who is here today to follow on recent ED visit with new onset of A-fib with RVR.  She presented to the [facility] ED on 06/27/24 for evaluation of an abnormal ECG done at the GI office for scheduled endoscopy earlier that morning. Vitals, labs, and imaging reviewed at ED; all stable and reassuring. ECG consistent with A-fib with RVR. She was stable for discharge and prescribed Eliquis  5 mg BID and Toprol  50 mg daily. She was previously on Olmesartan 5 mg once daily, discontinued to start Toprol .  She is not monitoring BP at home, regularly. Has tolerated medications well, no side effects reported. She has a few questions today about new Dx and her other meds.  Denies any chest pain, dyspnea, palpitations.   Lab Results  Component Value Date   NA 142 06/27/2024   CL 107 06/27/2024   K 4.4 06/27/2024   CO2 24 06/27/2024   BUN 18 06/27/2024   CREATININE 0.73 06/27/2024   GFRNONAA >60 06/27/2024   CALCIUM  9.7 06/27/2024   ALBUMIN  4.4 05/23/2024   GLUCOSE 121 (H) 06/27/2024   Lab Results  Component Value Date   WBC 6.3 06/27/2024   HGB 14.3 06/27/2024   HCT 42.3 06/27/2024   MCV 90.2 06/27/2024   PLT 306 06/27/2024   She followed up with the A-fib clinic yesterday for further evaluation. Per OV note, pt will continue Toprol  50 mg daily with conservative observation.    Today, she is feeling overall well and is asymptomatic.  She reports being completely asymptomatic during her episode of A-fib.  She has purchased an Apple watch to alert her of future A-fib episodes.  She is inquiring about possible causes of atrial fib, wonders if stress and dehydration may have been contributing factors. No recent viral illness.  Has hx of OSA with CPAP therapy. Reports chronic stress attributed to her son being on the transplant list and being his caregiver. She  adds this has been ongoing for several years.   -Of note, she previously had an odd taste in her mouth several months ago prompting her to be evaluated by her dentist and GI to determine whether it was due to dental complications or not.  She was started on antibiotics and Omeprazole  20 mg since early June. Her omeprazole  was increased to 40 mg once daily at her recent ED visit.  The odd taste in her mouth has since resolved and states she has not had GI issues for about 1 month.  EGD was cancelled due to atrial fib and not sure about when it will be performed. Negative for abdominal pain,nausea,vomiting,or melena.  -Chronic knee pain: She was previously taking Meloxicam  15 mg sporadically, as needed.  Has tried Voltaren  gel, stating it has slightly helped with symptom relief in the past.  Pain does not interfered with her daily activities.  She is currently on Vitamin D  supplementation; taking 4,000 units once daily. She was on ergocalciferol  50,000 units weekly and inquiring about the need for follow-up.  Lab Results  Component Value Date   VD25OH 26.79 (L) 02/07/2024   Review of Systems  Constitutional:  Negative for activity change, appetite change, chills and fever.  HENT:  Negative for sore throat and trouble swallowing.   Respiratory:  Negative for cough and wheezing.   Cardiovascular:  Negative for chest pain and leg swelling.  Genitourinary:  Negative for decreased urine volume, dysuria and hematuria.  Musculoskeletal:  Positive for arthralgias. Negative for gait problem.  Skin:  Negative for rash.  Neurological:  Negative for syncope, weakness and headaches.  See other pertinent positives and negatives in HPI.  Current Outpatient Medications on File Prior to Visit  Medication Sig Dispense Refill   apixaban  (ELIQUIS ) 5 MG TABS tablet Take 1 tablet (5 mg total) by mouth 2 (two) times daily. 60 tablet 6   calcium  carbonate (TUMS - DOSED IN MG ELEMENTAL CALCIUM ) 500 MG chewable  tablet Chew 2 tablets by mouth as needed for indigestion or heartburn.     carboxymethylcellulose (REFRESH PLUS) 0.5 % SOLN Place 1 drop into both eyes as needed.     Chlorphen-Pseudoephed-APAP (TYLENOL  ALLERGY SINUS PO) Take 1-2 tablets by mouth as needed.     Cholecalciferol (VITAMIN D3) 50 MCG (2000 UT) capsule Take 2 capsules (4,000 Units total) by mouth daily. 60 capsule 3   cyanocobalamin  (VITAMIN B12) 1000 MCG tablet Take 1,000 mcg by mouth daily.     Dihydroxyaluminum Sod Carb (ROLAIDS PO) Take 2 tablets by mouth as needed.     melatonin 3 MG TABS tablet Take 3 mg by mouth at bedtime. (Patient taking differently: Take 3 mg by mouth as needed.)     metoprolol  succinate (TOPROL  XL) 50 MG 24 hr tablet Take 1 tablet (50 mg total) by mouth daily. Take with or immediately following a meal. 30 tablet 6   Minoxidil (ROGAINE EX) Apply 1 Application topically as needed.     Olopatadine-Mometasone (RYALTRIS ) 665-25 MCG/ACT SUSP Place 1-2 sprays into the nose in the morning and at bedtime. 29 g 5   omeprazole  (PRILOSEC) 40 MG capsule Take 1 capsule (40 mg total) by mouth daily. 30 capsule 5   Respiratory Therapy Supplies (FLUTTER) DEVI 1 each by Does not apply route 2 (two) times daily. 1 each 0   UNABLE TO FIND Apply 1 Application topically as needed. Mentholatum Ointment     Current Facility-Administered Medications on File Prior to Visit  Medication Dose Route Frequency Provider Last Rate Last Admin   0.9 %  sodium chloride  infusion  500 mL Intravenous Continuous Reed, Leslie V, MD       0.9 %  sodium chloride  infusion  500 mL Intravenous Once Reed, Leslie V, MD        Past Medical History:  Diagnosis Date   Allergic rhinitis    Allergy    Arthritis    hands, lower back   Asthma    Bronchitis    uses inhaler prn   Cataract    just being monitored - no surgery   Colitis 8/04   Diverticulitis    GERD (gastroesophageal reflux disease)    diet controlled, no meds   History of  migraine headaches    Hx of colonic polyp    Hx of varicella    Hyperlipidemia    Insomnia    Interstitial cystitis 10/90   Mitral valve prolapse    Osteopenia    Scoliosis 2005   Sleep apnea    Tick bite 2014   STARI-antibiotics x 5 weeks   Tracheobronchopathia-osteochondroplastica    No Known Allergies  Social History   Socioeconomic History   Marital status: Married    Spouse name: Not on file   Number of children: Not on file   Years of education: Not on file   Highest education level: Master's degree (e.g., MA, MS, MEng, MEd, MSW, MBA)  Occupational History   Not on file  Tobacco Use   Smoking status: Never    Passive exposure: Yes   Smokeless tobacco: Never  Vaping Use   Vaping status: Never Used  Substance and Sexual Activity   Alcohol use: Not Currently    Comment: occ   Drug use: No   Sexual activity: Not Currently    Partners: Male    Birth control/protection: Surgical, Post-menopausal    Comment: TAH  Other Topics Concern   Not on file  Social History Narrative   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   hhof 2   G6 p4    Pet cats    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    [address] 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   Neg td    Sleep 4-6 interrupted    Family is WISC father with alzheimers   Social Drivers of Health   Financial Resource Strain: Low Risk  (07/02/2024)   Overall Financial Resource Strain (CARDIA)    Difficulty of Paying Living Expenses: Not hard at all  Food Insecurity: No Food Insecurity (07/02/2024)   Hunger Vital Sign    Worried About Running Out of Food in the Last Year: Never true    Ran Out of Food in the Last Year: Never true  Transportation Needs: No Transportation Needs (07/02/2024)   PRAPARE - Administrator, Civil Service (Medical): No    Lack of Transportation (Non-Medical): No  Physical Activity: Sufficiently Active (07/02/2024)   Exercise Vital Sign    Days of Exercise per Week: 5 days    Minutes of Exercise per  Session: 60 min  Stress: Stress Concern Present (07/02/2024)   [employer] of Occupational Health - Occupational Stress Questionnaire    Feeling of Stress: To some extent  Social Connections: Moderately Isolated (07/02/2024)   Social Connection and Isolation Panel    Frequency of Communication with Friends and Family: More than three times a week    Frequency of Social Gatherings with Friends and Family: Three times a week    Attends Religious Services: Never    Active Member of Clubs or Organizations: No    Attends Banker Meetings: Not on file    Marital Status: Married    Vitals:   07/03/24 0950 07/03/24 1039  BP: 128/80   Pulse: 60 (!) 56  Resp: 16   SpO2: 98%    Body mass index is 24.55 kg/m.  Physical Exam Vitals and nursing note reviewed.  Constitutional:      General: She is not in acute distress.    Appearance: She is well-developed.  HENT:     Head: Normocephalic and atraumatic.     Mouth/Throat:     Mouth: Mucous membranes are dry.     Pharynx: Oropharynx is clear.  Eyes:     Conjunctiva/sclera: Conjunctivae normal.  Cardiovascular:     Rate and Rhythm: Regular rhythm. Bradycardia present.     Pulses:          Dorsalis pedis pulses are 2+ on the right side and 2+ on the left side.     Heart sounds: No murmur heard. Pulmonary:     Effort: Pulmonary effort is normal. No respiratory distress.     Breath sounds: Normal breath sounds.  Abdominal:     Palpations: Abdomen is soft. There is no hepatomegaly or mass.     Tenderness: There is no abdominal tenderness.  Musculoskeletal:     Right lower leg: No edema.  Left lower leg: No edema.  Lymphadenopathy:     Cervical: No cervical adenopathy.  Skin:    General: Skin is warm.     Findings: No erythema or rash.  Neurological:     General: No focal deficit present.     Mental Status: She is alert and oriented to person, place, and time.     Cranial Nerves: No cranial nerve deficit.      Gait: Gait normal.  Psychiatric:        Mood and Affect: Mood and affect normal.   ASSESSMENT AND PLAN: [name] was seen today for follow up on recent ED visit and new onset of A-fib.  Chronic pain of both knees  Recommend stopping meloxicam . She can try Tylenol  500 mg 3-4 times per day and Voltaren  gel 4 times daily as needed.  PAF (paroxysmal atrial fibrillation) (HCC) New diagnosis. Today rhythm and rate control, mildly bradycardic, asymptomatic. We discussed diagnosis, prognosis, and treatment. Currently on Eliquis  5 mg twice daily and metoprolol  succinate 50 mg daily. We discussed some side effects. Samples of Eliquis  given today. Continue monitoring HR regularly. Following with atrial fibrillation clinic.  Vitamin deficiency, unspecified Continue OTC vitamin D  4000 units daily. 25 OH vitamin D  can be added to next blood work.  Essential (primary) hypertension BP adequately controlled, recently antihypertensive treatment was changed from Olmesartan to metoprolol  succinate 50 mg daily due to new diagnosis of atrial fibrillation. Recommend monitoring BP daily for the next 2 weeks, if persistently < 140/90, she can continue monitoring BP weekly. Continue low-salt diet. Follow-up with PCP in 5 months.  Gastroesophageal reflux disease, unspecified whether esophagitis present Currently asymptomatic, she would like to try to stop omeprazole . Recommend weaning off omeprazole , decreased from 40 mg to 20 mg for 2 weeks then every other day for 2 weeks, and then every third day for 2 weeks then discontinue. If any symptoms recur, instructed to resume medication and take it daily 30 minutes before breakfast. Continue GERD precautions. Following with GI.  -Stressed the importance of adequate hydration.  I spent a total of 43 minutes in both face to face and non face to face activities for this visit on the date of this encounter. During this time history was obtained and  documented, examination was performed, prior labs/imaging reviewed, and assessment/plan discussed.  Return in about 5 months (around 12/03/2024) for chronic problems with PCP.  I, Vernell Forest, acting as a scribe for Shaneen Reeser [place], MD., have documented all relevant documentation on the behalf of Lovina Zuver [place], MD, as directed by   while in the presence of Rilea Arutyunyan [place], MD.  I, Bradley Handyside [place], MD, have reviewed all documentation for this visit. The documentation on 07/03/24 for the exam, diagnosis, procedures, and orders are all accurate and complete.  Damione Robideau G. [place], MD  [facility]

## 2024-07-03 NOTE — Patient Instructions (Addendum)
 A few things to remember from today's visit:  Vitamin deficiency, unspecified  PAF (paroxysmal atrial fibrillation) (HCC)  Essential (primary) hypertension  Gastroesophageal reflux disease, unspecified whether esophagitis present  You can try to wean off Omeprazole , 20 mg daily 30 min before breakfast for 2 weeks, then every other day for 2 weeks then every 3rd day. If any symptom comes back resume med daily. Cheat heart rate regularly, should be under 100/min, today it was mildly low. Monitor blood pressure, should be at least under 140/90. Continue vit D same dose. Arrange follow up appt with PCP for follow.  If you need refills for medications you take chronically, please call your pharmacy. Do not use My Chart to request refills or for acute issues that need immediate attention. If you send a my chart message, it may take a few days to be addressed, specially if I am not in the office.  Please be sure medication list is accurate. If a new problem present, please set up appointment sooner than planned today.

## 2024-07-18 ENCOUNTER — Telehealth: Payer: Self-pay | Admitting: Gastroenterology

## 2024-07-18 NOTE — Telephone Encounter (Signed)
 Inbound call from patient stating she would like to know if she can go ahead and reschedule her EGD. Patient was last scheduled on 06/27/24 and was sent to ER after admitting checked her in and realized patient was a-fib. Patient would like to know if she needs to send some type of paperwork clearance to be scheduled   Requesting a call back  Please advise  Thank you

## 2024-07-18 NOTE — Telephone Encounter (Signed)
 Okay to schedule procedure, will need clearance on hold for Eliquis  1 day prior to procedure from cardiology office.  Thank you

## 2024-07-18 NOTE — Telephone Encounter (Signed)
 Cardiology note indicates she will be able to hold her Eliquis  for her GI procedure 1 month from ER visit (06/28/24) Please advise on scheduling. thanks

## 2024-07-19 NOTE — Telephone Encounter (Signed)
 Okay to schedule her procedure in the [facility] after 07/29/24. Sometime in September is okay.  She will need a pre-visit if the [facility] date is past September. To summarize, EGD in [facility] the month or September. If scheduled after September it will be Pre-visit and [facility] EGD appointment. Let me know the date and I will reach out to her cardiology team.  Thank you for helping with her getting scheduled.

## 2024-07-20 NOTE — Telephone Encounter (Signed)
 She actively uses her My Chart. I have sent her a message asking she call us  about scheduling.

## 2024-07-20 NOTE — Telephone Encounter (Signed)
 Called patient to schedule EGD, was unable to leave voicemail due to mailbox being full. Will call patient again   Please advise  Thank you

## 2024-07-26 ENCOUNTER — Encounter: Payer: Self-pay | Admitting: Gastroenterology

## 2024-08-02 ENCOUNTER — Other Ambulatory Visit (HOSPITAL_COMMUNITY): Payer: Self-pay | Admitting: Internal Medicine

## 2024-08-03 LAB — CBC
Hematocrit: 40.1 % (ref 34.0–46.6)
Hemoglobin: 13.1 g/dL (ref 11.1–15.9)
MCH: 30.5 pg (ref 26.6–33.0)
MCHC: 32.7 g/dL (ref 31.5–35.7)
MCV: 93 fL (ref 79–97)
Platelets: 329 x10E3/uL (ref 150–450)
RBC: 4.3 x10E6/uL (ref 3.77–5.28)
RDW: 12.6 % (ref 11.7–15.4)
WBC: 5.7 x10E3/uL (ref 3.4–10.8)

## 2024-08-20 ENCOUNTER — Telehealth (HOSPITAL_COMMUNITY): Payer: Self-pay | Admitting: *Deleted

## 2024-08-20 ENCOUNTER — Encounter (HOSPITAL_BASED_OUTPATIENT_CLINIC_OR_DEPARTMENT_OTHER): Payer: Self-pay | Admitting: Obstetrics & Gynecology

## 2024-08-20 ENCOUNTER — Ambulatory Visit (INDEPENDENT_AMBULATORY_CARE_PROVIDER_SITE_OTHER): Payer: Self-pay | Admitting: Obstetrics & Gynecology

## 2024-08-20 VITALS — BP 134/81 | HR 57 | Ht 64.0 in | Wt 141.0 lb

## 2024-08-20 DIAGNOSIS — Z01419 Encounter for gynecological examination (general) (routine) without abnormal findings: Secondary | ICD-10-CM

## 2024-08-20 DIAGNOSIS — E559 Vitamin D deficiency, unspecified: Secondary | ICD-10-CM | POA: Diagnosis not present

## 2024-08-20 DIAGNOSIS — M85851 Other specified disorders of bone density and structure, right thigh: Secondary | ICD-10-CM

## 2024-08-20 DIAGNOSIS — M85852 Other specified disorders of bone density and structure, left thigh: Secondary | ICD-10-CM

## 2024-08-20 DIAGNOSIS — Z90710 Acquired absence of both cervix and uterus: Secondary | ICD-10-CM

## 2024-08-20 MED ORDER — METOPROLOL SUCCINATE ER 25 MG PO TB24: 25.0000 mg | ORAL_TABLET | Freq: Every day | ORAL | 2 refills | Status: DC

## 2024-08-20 NOTE — Patient Instructions (Addendum)
 Joane Lory, NP.

## 2024-08-20 NOTE — Progress Notes (Signed)
 Breast and Pelvic Exam Patient name: Leslie Reed MRN [id number]  Date of birth: 17-Aug-1946 Chief Complaint:   Annual Exam (Pt has no complaints today)  History of Present Illness:   Leslie Reed is a 78 y.o. [phone] Caucasian female being seen today for breast and pelvic exam.  Feels like she's fallen apart this year.  She has diagnostic laparoscopy for what was thought to be a perforated ulcer.  This was not present.  Drain was placed.  She is having an endoscopy at the end of October for follow up from the surgery.    Then she was diagnosed with afib this summer as well.  She is not on metoprolol  and eliquis .  Will need to have some dental work this year.    Denies vaginal bleeding.    Saw neurologist and has appt with neuropsychologist in December.    Patient's last menstrual period was 11/29/1986.   Last pap Hysterectomy. H/O abnormal pap: no Last mammogram: 09/09/2023. Results were: normal. Patient is scheduled for one in October 2025. Family h/o breast cancer: yes. Pt aunts had breast cancer.  Last colonoscopy: 12/07/2021. Results were: normal. Family h/o colorectal cancer: yes maternal grandfather. DEXA:  07/2023.  Stable osteopenia.       08/20/2024    9:52 AM 01/19/2024    6:22 PM 08/09/2023    1:16 PM 02/22/2023   10:12 AM 11/11/2022    9:36 AM  Depression screen PHQ 2/9  Decreased Interest 0 0 0 0 0  Down, Depressed, Hopeless 0 0 0 0 0  PHQ - 2 Score 0 0 0 0 0  Altered sleeping    1 1  Tired, decreased energy    1 1  Change in appetite    0 0  Feeling bad or failure about yourself     0 0  Trouble concentrating    0 0  Moving slowly or fidgety/restless    0 0  Suicidal thoughts    0 0  PHQ-9 Score    2 2  Difficult doing work/chores     Not difficult at all        02/22/2023   10:12 AM  GAD 7 : Generalized Anxiety Score  Nervous, Anxious, on Edge 1  Control/stop worrying 0  Worry too much - different things 0  Trouble relaxing 0  Restless 0  Easily  annoyed or irritable 1  Afraid - awful might happen 0  Total GAD 7 Score 2  Anxiety Difficulty Not difficult at all     Review of Systems:   Pertinent items are noted in HPI Denies any urinary or bowel changes.  Denies pelvic pain.   Pertinent History Reviewed:  Reviewed past medical,surgical, social and family history.  Reviewed problem list, medications and allergies. Physical Assessment:   Vitals:   08/20/24 0943 08/20/24 0953  BP: (!) 156/80 134/81  Pulse: (!) 57   SpO2: 99%   Weight: 141 lb (64 kg)   Height: 5' 4 (1.626 m)   Body mass index is 24.2 kg/m.        Physical Examination:   General appearance - well appearing, and in no distress  Mental status - alert, oriented to person, place, and time  Psych:  She has a normal mood and affect  Skin - warm and dry, normal color, no suspicious lesions noted  Chest - effort normal, all lung fields clear to auscultation bilaterally  Heart - normal rate and regular rhythm  Neck:  midline trachea, no thyromegaly or nodules  Breasts - breasts appear normal, no suspicious masses, no skin or nipple changes or  axillary nodes  Abdomen - soft, nontender, nondistended, no masses or organomegaly  Pelvic - VULVA: normal appearing vulva with no masses, tenderness or lesions   VAGINA: normal appearing vagina with normal color and discharge, no lesions   CERVIX: surgically absent  Thin prep pap is not indicated today  UTERUS: surgically absent  ADNEXA: No adnexal masses or tenderness noted.  Rectal - normal rectal, good sphincter tone, no masses felt.   Extremities:  No swelling or varicosities noted  Chaperone present for exam  No results found for this or any previous visit (from the past 24 hours).  Assessment & Plan:  1. Encntr for gyn exam (general) (routine) w/o abn findings (Primary) - Pap smear not indicated - Mammogram 09/09/2023 - Colonoscopy 12/07/2021 - Bone mineral density done 2024 - lab work done with PCP, Dr.  Charlett - vaccines reviewed/updated  2. Osteopenia of both hips - taking OTC Vit D.  Stopped taking prescription.  Dosage reviewed.  Reviewed Dexa from last year.  Will consider repeating in 2 yeras  3. Vitamin D  deficiency - had level checked 01/2024.    4. H/O: hysterectomy   No orders of the defined types were placed in this encounter.   Meds: No orders of the defined types were placed in this encounter.   Follow-up: No follow-ups on file.  Ronal GORMAN Pinal, MD 08/20/2024 10:21 AM

## 2024-08-20 NOTE — Telephone Encounter (Signed)
 Spoke with pt on the phone about her concerns about feeling tired with metoprolol . After speaking with J.Suarez PA. He wants to decrease dose to 25 mg at bedtime. Reviewed plan with pt she verbalized understanding and agrees. Pt will also monitor HR and BP and call  us  next week for update.

## 2024-08-23 ENCOUNTER — Telehealth: Payer: Self-pay

## 2024-08-23 NOTE — Telephone Encounter (Signed)
 Would you look at the office visit with Cardiology and make sure it is sufficient for documentation purposes for clearance?  If it is not, I will send something to Cardiology.

## 2024-08-23 NOTE — Telephone Encounter (Signed)
 [facility] Pre-operative Risk Assessment     Request for surgical clearance:     Endoscopy Procedure  What type of surgery is being performed?     endoscopy  When is this surgery scheduled?     09/27/24  What type of clearance is required ?   Pharmacy  Are there any medications that need to be held prior to surgery and how long? Eliquis  to be held 2 days  Practice name and name of physician performing surgery?      [facility] with Dr Shila  What is your office phone and fax number?      Phone- [phone]  Fax- [phone]  Anesthesia type (None, local, MAC, general) ?       MAC   Please route your response to Almarie Goo, LPN

## 2024-08-23 NOTE — Telephone Encounter (Signed)
 I do see where it was said she can hold Eliquis  one month after her ED visit. I guess I'm just wondering if it would be 1 or 2 days?

## 2024-08-23 NOTE — Telephone Encounter (Signed)
 I have sent a clearance request to Cardiology.

## 2024-08-23 NOTE — Telephone Encounter (Signed)
 Leslie Reed,   I see this patient got r/s for her procedures. I just want to make sure cardiac clearance is obtained. I do not see anything on my end but I could be over looking it.   Thank you

## 2024-08-28 NOTE — Telephone Encounter (Signed)
 Pt called with update of reduce dose of metoprolol  BP 121/70,132/76, 169/83 with HR 47-58. She feels about the same of feeling tired but in NSR per fit bit. Discussed J.Terra P.A. he does not want to make any more changes to her medication. Pt instructed to also follow up with PCP for further work up.

## 2024-08-29 NOTE — Telephone Encounter (Signed)
 Patient with diagnosis of Afib on Eliquis  for anticoagulation.    What type of surgery is being performed?     endoscopy  When is this surgery scheduled?     09/27/24  CHA2DS2-VASc Score = 4   This indicates a 4.8% annual risk of stroke. The patient's score is based upon: CHF History: 0 HTN History: 1 Diabetes History: 0 Stroke History: 0 Vascular Disease History: 0 Age Score: 2 Gender Score: 1  CrCl 56 ml/min Platelet count 329 K  Patient has not had an Afib/aflutter ablation within the last 3 months or DCCV within the last 30 days  Per office protocol, patient can hold Eliquis  for 2 days prior to procedure.   Patient will not need bridging with Lovenox  (enoxaparin ) around procedure.  **This guidance is not considered finalized until pre-operative APP has relayed final recommendations.**

## 2024-08-29 NOTE — Telephone Encounter (Signed)
   Primary Cardiologist: None  Chart reviewed as part of pre-operative protocol coverage. Given past medical history and time since last visit, based on ACC/AHA guidelines, Leslie Reed would be at acceptable risk for the planned procedure without further cardiovascular testing.   Patient should contact our office if she is having new symptoms that are concerning from a cardiac perspective to arrange a follow-up appointment.    Per office protocol, she may hold Eliquis  for 2 days prior to procedure and should resume as soon as hemodynamically stable postoperatively.  I will route this recommendation to the requesting party via Epic fax function and remove from pre-op pool.  Please call with questions.  Rosaline EMERSON Bane, NP-C 08/29/2024, 3:35 PM [address] [place], [place] [address] Office [phone] Fax [phone]

## 2024-09-13 ENCOUNTER — Ambulatory Visit (AMBULATORY_SURGERY_CENTER)

## 2024-09-13 VITALS — Ht 64.0 in | Wt 140.0 lb

## 2024-09-13 DIAGNOSIS — R933 Abnormal findings on diagnostic imaging of other parts of digestive tract: Secondary | ICD-10-CM

## 2024-09-13 DIAGNOSIS — R1013 Epigastric pain: Secondary | ICD-10-CM

## 2024-09-13 NOTE — Progress Notes (Signed)
 No egg or soy allergy known to patient  No issues known to pt with past sedation with any surgeries or procedures Patient denies ever being told they had issues or difficulty with intubation  No FH of Malignant Hyperthermia Pt is not on diet pills Pt is not on  home 02  Pt is on Eliquis   Hx A fib  Have any cardiac testing pending-- no  LOA: independent    Patient's chart reviewed by Norleen Schillings CNRA prior to previsit and patient appropriate for the [facility].  Previsit completed and red dot placed by patient's name on their procedure day (on provider's schedule).     PV completed with patient. Prep instructions sent via mychart and home address.

## 2024-09-20 ENCOUNTER — Encounter: Payer: Self-pay | Admitting: Gastroenterology

## 2024-09-21 LAB — HM MAMMOGRAPHY

## 2024-09-24 ENCOUNTER — Ambulatory Visit: Admitting: Adult Health

## 2024-09-24 ENCOUNTER — Encounter: Payer: Self-pay | Admitting: Adult Health

## 2024-09-24 ENCOUNTER — Encounter (HOSPITAL_BASED_OUTPATIENT_CLINIC_OR_DEPARTMENT_OTHER): Payer: Self-pay | Admitting: Obstetrics & Gynecology

## 2024-09-24 VITALS — BP 111/68 | HR 65 | Ht 64.0 in | Wt 148.0 lb

## 2024-09-24 DIAGNOSIS — G4733 Obstructive sleep apnea (adult) (pediatric): Secondary | ICD-10-CM

## 2024-09-24 DIAGNOSIS — I4891 Unspecified atrial fibrillation: Secondary | ICD-10-CM | POA: Insufficient documentation

## 2024-09-24 NOTE — Progress Notes (Signed)
 Community message has been sent to [facility] for supplies on 09/24/24. DD

## 2024-09-24 NOTE — Patient Instructions (Addendum)
 Your Plan:  Continue using CPAP nightly and greater than 4 hours each night If your symptoms worsen or you develop new symptoms please let us  know.       Thank you for coming to see us  at [facility]. I hope we have been able to provide you high quality care today.  You may receive a patient satisfaction survey over the next few weeks. We would appreciate your feedback and comments so that we may continue to improve ourselves and the health of our patients.

## 2024-09-24 NOTE — Progress Notes (Signed)
 PATIENT: Leslie Reed DOB: 11-26-1946  REASON FOR VISIT: follow up HISTORY FROM: patient PRIMARY NEUROLOGIST: Dr. Chalice  Chief Complaint  Patient presents with   RM 4    Patient is here alone for sleep - no issues with her machine or mask. The head straps slips over the top of her head  ESS - 4      HISTORY OF PRESENT ILLNESS: Today 09/24/24:  Leslie Reed is a 78 y.o. female with a history of OSA on CPAP. Returns today for follow-up.  Reports that CPAP is working well for her now that she has a new machine.  She states that she is now using it longer.  She currently has the nasal mask.  Does states she needs new supplies.  Her download is below       REVIEW OF SYSTEMS: Out of a complete 14 system review of symptoms, the patient complains only of the following symptoms, and all other reviewed systems are negative.   ESS 4  ALLERGIES: No Known Allergies  HOME MEDICATIONS: Outpatient Medications Prior to Visit  Medication Sig Dispense Refill   apixaban  (ELIQUIS ) 5 MG TABS tablet Take 1 tablet (5 mg total) by mouth 2 (two) times daily. 60 tablet 6   calcium  carbonate (TUMS - DOSED IN MG ELEMENTAL CALCIUM ) 500 MG chewable tablet Chew 2 tablets by mouth as needed for indigestion or heartburn.     carboxymethylcellulose (REFRESH PLUS) 0.5 % SOLN Place 1 drop into both eyes as needed.     Chlorphen-Pseudoephed-APAP (TYLENOL  ALLERGY SINUS PO) Take 1-2 tablets by mouth as needed.     Cholecalciferol (VITAMIN D3) 50 MCG (2000 UT) capsule Take 2 capsules (4,000 Units total) by mouth daily. 60 capsule 3   cyanocobalamin  (VITAMIN B12) 1000 MCG tablet Take 1,000 mcg by mouth daily.     Dihydroxyaluminum Sod Carb (ROLAIDS PO) Take 2 tablets by mouth as needed.     melatonin 3 MG TABS tablet Take 3 mg by mouth at bedtime.     metoprolol  succinate (TOPROL  XL) 25 MG 24 hr tablet Take 1 tablet (25 mg total) by mouth at bedtime. Take with or immediately following a meal. 30  tablet 2   Olopatadine-Mometasone (RYALTRIS ) 665-25 MCG/ACT SUSP Place 1-2 sprays into the nose in the morning and at bedtime. 29 g 5   Respiratory Therapy Supplies (FLUTTER) DEVI 1 each by Does not apply route 2 (two) times daily. 1 each 0   UNABLE TO FIND Apply 1 Application topically as needed. Mentholatum Ointment     diclofenac  (VOLTAREN ) 75 MG EC tablet Take 75 mg by mouth as needed. (Patient not taking: Reported on 09/24/2024)     Minoxidil (ROGAINE EX) Apply 1 Application topically as needed. (Patient not taking: Reported on 09/24/2024)     Facility-Administered Medications Prior to Visit  Medication Dose Route Frequency Provider Last Rate Last Admin   0.9 %  sodium chloride  infusion  500 mL Intravenous Continuous Nandigam, Kavitha V, MD       0.9 %  sodium chloride  infusion  500 mL Intravenous Once Nandigam, Kavitha V, MD        PAST MEDICAL HISTORY: Past Medical History:  Diagnosis Date   Allergic rhinitis    Allergy    Arthritis    hands, lower back   Asthma    Atrial fib/flutter, transient (HCC)    Bronchitis    uses inhaler prn   Cataract    just being monitored -  no surgery   Colitis 06/2003   Diverticulitis    GERD (gastroesophageal reflux disease)    diet controlled, no meds   History of migraine headaches    Hx of colonic polyp    Hx of varicella    Hyperlipidemia    Insomnia    Interstitial cystitis 08/1989   Mitral valve prolapse    Osteopenia    Scoliosis 2005   Sleep apnea    Tick bite 2014   STARI-antibiotics x 5 weeks   Tracheobronchopathia-osteochondroplastica     PAST SURGICAL HISTORY: Past Surgical History:  Procedure Laterality Date   ABDOMINAL HYSTERECTOMY     partial  for pain no cancer     ADENOIDECTOMY     APPENDECTOMY     BREAST BIOPSY Right    x 2    BRONCHIAL WASHINGS  04/22/2020   Procedure: BRONCHIAL lavage;  Surgeon: Gretta Leita SQUIBB, DO;  Location: [facility];  Service: Endoscopy;;   CESAREAN SECTION     x4    CHOLECYSTECTOMY     colon polyps     COLONOSCOPY  09/09/2016   Dr.Nandigam   DILATION AND CURETTAGE OF UTERUS     x 2   LAPAROSCOPY  05/01/2024   Procedure: LAPAROSCOPY, DIAGNOSTIC;  Surgeon: Rubin Calamity, MD;  Location: [facility] OR;  Service: General;;   TONSILLECTOMY     VIDEO BRONCHOSCOPY N/A 04/22/2020   Procedure: VIDEO BRONCHOSCOPY WITHOUT FLUORO;  Surgeon: Gretta Leita SQUIBB, DO;  Location: [facility];  Service: Endoscopy;  Laterality: N/A;   WISDOM TOOTH EXTRACTION      FAMILY HISTORY: Family History  Problem Relation Age of Onset   Diabetes Mother    Pulmonary fibrosis Mother        3 deceased   Hypertension Mother    Hypertension Father    Alzheimer's disease Father    Kidney failure Father    Diabetes Sister    Hypertension Brother    Sleep apnea Brother    Diabetes Maternal Grandmother    Colon cancer Maternal Grandfather    Colon polyps Daughter    Thyroid  disease Daughter    Psoriasis Daughter    Thyroid  disease Daughter    Diabetes Son    Allergy (severe) Son    Depression Son    Esophageal cancer Neg Hx    Rectal cancer Neg Hx    Stomach cancer Neg Hx     SOCIAL HISTORY: Social History   Socioeconomic History   Marital status: Married    Spouse name: Not on file   Number of children: Not on file   Years of education: Not on file   Highest education level: Master's degree (e.g., MA, MS, MEng, MEd, MSW, MBA)  Occupational History   Not on file  Tobacco Use   Smoking status: Never    Passive exposure: Yes   Smokeless tobacco: Never  Vaping Use   Vaping status: Never Used  Substance and Sexual Activity   Alcohol use: Not Currently    Comment: occ   Drug use: No   Sexual activity: Not Currently    Partners: Male    Birth control/protection: Surgical, Post-menopausal    Comment: TAH  Other Topics Concern   Not on file  Social History Narrative   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   hhof 2   G6 p4    Pet cats    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    [address] 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   Neg td  Sleep 4-6 interrupted    Family is WISC father with alzheimer's      3 cups of caffeine  coke and coffee    Social Drivers of Corporate Investment Banker Strain: Low Risk  (07/02/2024)   Overall Financial Resource Strain (CARDIA)    Difficulty of Paying Living Expenses: Not hard at all  Food Insecurity: No Food Insecurity (07/02/2024)   Hunger Vital Sign    Worried About Running Out of Food in the Last Year: Never true    Ran Out of Food in the Last Year: Never true  Transportation Needs: No Transportation Needs (07/02/2024)   PRAPARE - Administrator, Civil Service (Medical): No    Lack of Transportation (Non-Medical): No  Physical Activity: Sufficiently Active (07/02/2024)   Exercise Vital Sign    Days of Exercise per Week: 5 days    Minutes of Exercise per Session: 60 min  Stress: Stress Concern Present (07/02/2024)   [employer] of Occupational Health - Occupational Stress Questionnaire    Feeling of Stress: To some extent  Social Connections: Moderately Isolated (07/02/2024)   Social Connection and Isolation Panel    Frequency of Communication with Friends and Family: More than three times a week    Frequency of Social Gatherings with Friends and Family: Three times a week    Attends Religious Services: Never    Active Member of Clubs or Organizations: No    Attends Banker Meetings: Not on file    Marital Status: Married  Intimate Partner Violence: Not At Risk (05/01/2024)   Humiliation, Afraid, Rape, and Kick questionnaire    Fear of Current or Ex-Partner: No    Emotionally Abused: No    Physically Abused: No    Sexually Abused: No      PHYSICAL EXAM  Vitals:   09/24/24 1300  BP: 111/68  Pulse: 65  SpO2: 96%  Weight: 148 lb (67.1 kg)  Height: 5' 4 (1.626 m)   Body mass index is 25.4 kg/m.  Generalized: Well developed, in no acute distress  Chest: Lungs clear to  auscultation bilaterally  Neurological examination  Mentation: Alert oriented to time, place, history taking. Follows all commands speech and language fluent Cranial nerve II-XII: Extraocular movements were full, visual field were full on confrontational test Head turning and shoulder shrug  were normal and symmetric. Motor: The motor testing reveals 5 over 5 strength of all 4 extremities. Good symmetric motor tone is noted throughout.  Sensory: Sensory testing is intact to soft touch on all 4 extremities. No evidence of extinction is noted.  Gait and station: Gait is normal.    DIAGNOSTIC DATA (LABS, IMAGING, TESTING) - I reviewed patient records, labs, notes, testing and imaging myself where available.  Lab Results  Component Value Date   WBC 5.7 08/02/2024   HGB 13.1 08/02/2024   HCT 40.1 08/02/2024   MCV 93 08/02/2024   PLT 329 08/02/2024      Component Value Date/Time   NA 142 06/27/2024 1051   NA 143 04/26/2024 1609   K 4.4 06/27/2024 1051   CL 107 06/27/2024 1051   CO2 24 06/27/2024 1051   GLUCOSE 121 (H) 06/27/2024 1051   BUN 18 06/27/2024 1051   BUN 21 04/26/2024 1609   CREATININE 0.73 06/27/2024 1051   CREATININE 0.66 07/01/2015 1415   CALCIUM  9.7 06/27/2024 1051   PROT 6.6 05/23/2024 1151   PROT 6.7 04/26/2024 1609   ALBUMIN  4.4 05/23/2024 1151  ALBUMIN  4.8 04/26/2024 1609   AST 17 05/23/2024 1151   ALT 14 05/23/2024 1151   ALKPHOS 63 05/23/2024 1151   BILITOT 0.4 05/23/2024 1151   BILITOT 0.3 04/26/2024 1609   GFRNONAA >60 06/27/2024 1051   Lab Results  Component Value Date   CHOL 200 02/07/2024   HDL 64.00 02/07/2024   LDLCALC 119 (H) 02/07/2024   LDLDIRECT 138.0 10/13/2011   TRIG 85.0 02/07/2024   CHOLHDL 3 02/07/2024   Lab Results  Component Value Date   HGBA1C 5.7 02/07/2024   Lab Results  Component Value Date   VITAMINB12 1,010 04/26/2024   Lab Results  Component Value Date   TSH 1.350 06/27/2024      ASSESSMENT AND PLAN 78 y.o.  year old female  has a past medical history of Allergic rhinitis, Allergy, Arthritis, Asthma, Atrial fib/flutter, transient (HCC), Bronchitis, Cataract, Colitis (06/2003), Diverticulitis, GERD (gastroesophageal reflux disease), History of migraine headaches, colonic polyp, varicella, Hyperlipidemia, Insomnia, Interstitial cystitis (08/1989), Mitral valve prolapse, Osteopenia, Scoliosis (2005), Sleep apnea, Tick bite (2014), and Tracheobronchopathia-osteochondroplastica. here with:  OSA on CPAP  - CPAP compliance excellent - Good treatment of AHI  - Encourage patient to use CPAP nightly and > 4 hours each night - Order sent for new supplies - F/U in 1 year or sooner if needed  Orders Placed This Encounter  Procedures   For home use only DME continuous positive airway pressure (CPAP)    Needs new Supplies    Length of Need:   12 Months    Patient has OSA or probable OSA:   Yes    Is the patient currently using CPAP in the home:   Yes    Settings:   Other see comments    CPAP supplies needed:   Mask, headgear, cushions, filters, heated tubing and water chamber     Duwaine Russell, MSN, NP-C 09/24/2024, 1:25 PM [facility] [address], [address] [place], [place] [address] [phone]  The patient's condition requires frequent monitoring and adjustments in the treatment plan, reflecting the ongoing complexity of care.  This provider is the continuing focal point for all needed services for this condition.

## 2024-09-25 ENCOUNTER — Ambulatory Visit: Admitting: Internal Medicine

## 2024-09-25 ENCOUNTER — Encounter: Payer: Self-pay | Admitting: Internal Medicine

## 2024-09-25 VITALS — BP 130/66 | HR 63 | Temp 97.5°F | Ht 64.0 in | Wt 145.0 lb

## 2024-09-25 DIAGNOSIS — R739 Hyperglycemia, unspecified: Secondary | ICD-10-CM

## 2024-09-25 DIAGNOSIS — R8290 Unspecified abnormal findings in urine: Secondary | ICD-10-CM | POA: Diagnosis not present

## 2024-09-25 DIAGNOSIS — I10 Essential (primary) hypertension: Secondary | ICD-10-CM

## 2024-09-25 DIAGNOSIS — M545 Low back pain, unspecified: Secondary | ICD-10-CM | POA: Diagnosis not present

## 2024-09-25 DIAGNOSIS — Z79899 Other long term (current) drug therapy: Secondary | ICD-10-CM

## 2024-09-25 DIAGNOSIS — R413 Other amnesia: Secondary | ICD-10-CM

## 2024-09-25 DIAGNOSIS — G4733 Obstructive sleep apnea (adult) (pediatric): Secondary | ICD-10-CM

## 2024-09-25 LAB — POCT URINALYSIS DIPSTICK
Bilirubin, UA: NEGATIVE
Blood, UA: NEGATIVE
Glucose, UA: NEGATIVE
Ketones, UA: NEGATIVE
Leukocytes, UA: NEGATIVE
Nitrite, UA: NEGATIVE
Protein, UA: POSITIVE — AB
Spec Grav, UA: 1.015 (ref 1.010–1.025)
Urobilinogen, UA: 0.2 U/dL
pH, UA: 6.5 (ref 5.0–8.0)

## 2024-09-25 LAB — POCT GLYCOSYLATED HEMOGLOBIN (HGB A1C): Hemoglobin A1C: 5.6 % (ref 4.0–5.6)

## 2024-09-25 NOTE — Patient Instructions (Addendum)
 Good to see you today .  Will do a more throrough review  The back pain sees mechanical .  Near the SI joit  Although not sure if related to lower pelvic  abd pain . Consider seeing  Sports medicine   about this consider more back imaging  Ok to take plain tylenol  500 twice a day as needed for pain  Urine screen is negative for infection today .   Exam is good  today  .  Uncertain if would be helpful to do a short term heart monitor to assess a fib uren and do a fu  echo exam .   I agree the  screening test by insurance may not be accurate  since you have no sx  We could consider doing a formal circulation test   with ABI in future but  will delay that for now. . Would avoid  decongestants as possible  to not trigger  a fib    Blood sugar  A1c today is   Plan fu appt  April or as indicated

## 2024-09-25 NOTE — Progress Notes (Signed)
 Chief Complaint  Patient presents with   Medical Management of Chronic Issues    Pt reports A fib back in July during endoscopy procedure. . GI sx getting worse again. Endoscopy this Thursday. 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    Back Pain    Pt reports back pain with urine odor. Would like check urinalysis    HPI: Leslie Reed 78 y.o. come in for fu  multiple conditions anc concerns  many issues .   Endoscopy this Thursday  Check urine  low back pain recently and maybe has odor  no dysuria no fever  Hx of a fib recently noted  planning to get during procedure  sent to hosp and  a fib clinic  no updated  echo  Saw dr Jordan for knee pain  in August  Bilateral lbp points to si area  paon comes and goes  uncertain if refers to lower abd font pelvis  no assoc sx  Memory seems  typical for age but to have neuro psych .  To evaluated more  per neuro  Insurance house calls says r  foot had poor circulation  screen  she has no sx and no claudications feet do feel cold at night  but no pain color changes  Had r thumb laceration  healing after stiches removed   She is very active physically  ROS: See pertinent positives and negatives per HPI. No current cp sob syncope   Past Medical History:  Diagnosis Date   Allergic rhinitis    Allergy    Arthritis    hands, lower back   Asthma    Atrial fib/flutter, transient (HCC)    Bronchitis    uses inhaler prn   Cataract    just being monitored - no surgery   Colitis 06/2003   Diverticulitis    GERD (gastroesophageal reflux disease)    diet controlled, no meds   History of migraine headaches    Hx of colonic polyp    Hx of varicella    Hyperlipidemia    Insomnia    Interstitial cystitis 08/1989   Mitral valve prolapse    Osteopenia    Scoliosis 2005   Sleep apnea    Tick bite 2014   STARI-antibiotics x 5 weeks   Tracheobronchopathia-osteochondroplastica     Family History  Problem Relation Age of Onset   Diabetes Mother     Pulmonary fibrosis Mother        62 deceased   Hypertension Mother    Hypertension Father    Alzheimer's disease Father    Kidney failure Father    Diabetes Sister    Hypertension Brother    Sleep apnea Brother    Diabetes Maternal Grandmother    Colon cancer Maternal Grandfather    Colon polyps Daughter    Thyroid  disease Daughter    Psoriasis Daughter    Thyroid  disease Daughter    Diabetes Son    Allergy (severe) Son    Depression Son    Esophageal cancer Neg Hx    Rectal cancer Neg Hx    Stomach cancer Neg Hx     Social History   Socioeconomic History   Marital status: Married    Spouse name: Not on file   Number of children: Not on file   Years of education: Not on file   Highest education level: Master's degree (e.g., MA, MS, MEng, MEd, MSW, MBA)  Occupational History   Not on file  Tobacco  Use   Smoking status: Never    Passive exposure: Yes   Smokeless tobacco: Never  Vaping Use   Vaping status: Never Used  Substance and Sexual Activity   Alcohol use: Not Currently    Comment: occ   Drug use: No   Sexual activity: Not Currently    Partners: Male    Birth control/protection: Surgical, Post-menopausal    Comment: TAH  Other Topics Concern   Not on file  Social History Narrative   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   hhof 2   G6 p4    Pet cats    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    [address] 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   Neg td    Sleep 4-6 interrupted    Family is WISC father with alzheimer's      3 cups of caffeine  coke and coffee    Social Drivers of Corporate Investment Banker Strain: Low Risk  (07/02/2024)   Overall Financial Resource Strain (CARDIA)    Difficulty of Paying Living Expenses: Not hard at all  Food Insecurity: No Food Insecurity (07/02/2024)   Hunger Vital Sign    Worried About Running Out of Food in the Last Year: Never true    Ran Out of Food in the Last Year: Never true  Transportation Needs: No Transportation Needs  (07/02/2024)   PRAPARE - Administrator, Civil Service (Medical): No    Lack of Transportation (Non-Medical): No  Physical Activity: Sufficiently Active (07/02/2024)   Exercise Vital Sign    Days of Exercise per Week: 5 days    Minutes of Exercise per Session: 60 min  Stress: Stress Concern Present (07/02/2024)   [employer] of Occupational Health - Occupational Stress Questionnaire    Feeling of Stress: To some extent  Social Connections: Moderately Isolated (07/02/2024)   Social Connection and Isolation Panel    Frequency of Communication with Friends and Family: More than three times a week    Frequency of Social Gatherings with Friends and Family: Three times a week    Attends Religious Services: Never    Active Member of Clubs or Organizations: No    Attends Engineer, Structural: Not on file    Marital Status: Married    Outpatient Medications Prior to Visit  Medication Sig Dispense Refill   calcium  carbonate (TUMS - DOSED IN MG ELEMENTAL CALCIUM ) 500 MG chewable tablet Chew 2 tablets by mouth as needed for indigestion or heartburn.     carboxymethylcellulose (REFRESH PLUS) 0.5 % SOLN Place 1 drop into both eyes as needed.     Chlorphen-Pseudoephed-APAP (TYLENOL  ALLERGY SINUS PO) Take 1-2 tablets by mouth as needed.     Cholecalciferol (VITAMIN D3) 50 MCG (2000 UT) capsule Take 2 capsules (4,000 Units total) by mouth daily. 60 capsule 3   cyanocobalamin  (VITAMIN B12) 1000 MCG tablet Take 1,000 mcg by mouth daily.     Dihydroxyaluminum Sod Carb (ROLAIDS PO) Take 2 tablets by mouth as needed.     melatonin 3 MG TABS tablet Take 3 mg by mouth at bedtime.     metoprolol  succinate (TOPROL  XL) 25 MG 24 hr tablet Take 1 tablet (25 mg total) by mouth at bedtime. Take with or immediately following a meal. 30 tablet 2   Olopatadine-Mometasone (RYALTRIS ) 665-25 MCG/ACT SUSP Place 1-2 sprays into the nose in the morning and at bedtime. 29 g 5   UNABLE TO FIND Apply 1  Application topically as needed.  Mentholatum Ointment     apixaban  (ELIQUIS ) 5 MG TABS tablet Take 1 tablet (5 mg total) by mouth 2 (two) times daily. (Patient not taking: Reported on 09/25/2024) 60 tablet 6   diclofenac  (VOLTAREN ) 75 MG EC tablet Take 75 mg by mouth as needed. (Patient not taking: Reported on 09/24/2024)     Minoxidil (ROGAINE EX) Apply 1 Application topically as needed. (Patient not taking: Reported on 09/24/2024)     Respiratory Therapy Supplies (FLUTTER) DEVI 1 each by Does not apply route 2 (two) times daily. (Patient not taking: Reported on 09/25/2024) 1 each 0   Facility-Administered Medications Prior to Visit  Medication Dose Route Frequency Provider Last Rate Last Admin   0.9 %  sodium chloride  infusion  500 mL Intravenous Continuous Nandigam, Kavitha V, MD       0.9 %  sodium chloride  infusion  500 mL Intravenous Once Nandigam, Kavitha V, MD         EXAM:  BP 130/66 (BP Location: Left Arm, Patient Position: Sitting, Cuff Size: Normal)   Pulse 63   Temp (!) 97.5 F (36.4 C) (Oral)   Ht 5' 4 (1.626 m)   Wt 145 lb (65.8 kg)   LMP 11/29/1986   SpO2 97%   BMI 24.89 kg/m   Body mass index is 24.89 kg/m.  GENERAL: vitals reviewed and listed above, alert, oriented, appears well hydrated and in no acute distress HEENT: atraumatic, conjunctiva  clear, no obvious abnormalities on inspection of external nose and ears  NECK: no obvious masses on inspection palpation  LUNGS: clear to auscultation bilaterally, no wheezes, rales or rhonchi, good air movement CV: HRRR, no clubbing cyanosis or  peripheral edema nl cap refill  MS: moves all extremities without noticeable focal  abnormality r thumb with steri strips  PSYCH: pleasant and cooperative, no obvious depression or anxiety Lab Results  Component Value Date   WBC 5.7 08/02/2024   HGB 13.1 08/02/2024   HCT 40.1 08/02/2024   PLT 329 08/02/2024   GLUCOSE 121 (H) 06/27/2024   CHOL 200 02/07/2024   TRIG 85.0  02/07/2024   HDL 64.00 02/07/2024   LDLDIRECT 138.0 10/13/2011   LDLCALC 119 (H) 02/07/2024   ALT 14 05/23/2024   AST 17 05/23/2024   NA 142 06/27/2024   K 4.4 06/27/2024   CL 107 06/27/2024   CREATININE 0.73 06/27/2024   BUN 18 06/27/2024   CO2 24 06/27/2024   TSH 1.350 06/27/2024   HGBA1C 5.6 09/25/2024   BP Readings from Last 3 Encounters:  09/25/24 130/66  09/24/24 111/68  08/20/24 134/81    ASSESSMENT AND PLAN:  Discussed the following assessment and plan:  Essential (primary) hypertension  Medication management  Memory difficulty  OSA on CPAP  Midline low back pain without sciatica, unspecified chronicity - Plan: POC Urinalysis Dipstick  Abnormal urine odor - Plan: POC Urinalysis Dipstick  Hyperglycemia - Plan: POC HgB A1c Hx of a fib  asymptomatic   Apparently had no sx of a fib and was noted when presented for procedure .   No monitor  or echo updated  but was placed on  anticoagulation and  b blocker  . Had fatigue but better on lower dose  .  Had EKG watch if needed but  uncertain burden  Advised no decongestants  with tylenol  sinus  and try INCS for at least 2 weeks to see if helps.  I would  like her to get cv assessment  Multiple issues addressed today  40 minutes   si lbp seems most recent   Agree to progress with memory  assessment  . ? Could she had add adhd  as active moptor and  active mine and thought.   -Patient advised to return or notify health care team  if  new concerns arise.  Patient Instructions  Good to see you today .  Will do a more throrough review  The back pain sees mechanical .  Near the SI joit  Although not sure if related to lower pelvic  abd pain . Consider seeing  Sports medicine   about this consider more back imaging  Ok to take plain tylenol  500 twice a day as needed for pain  Urine screen is negative for infection today .   Exam is good  today  .  Uncertain if would be helpful to do a short term heart monitor to assess  a fib uren and do a fu  echo exam .   I agree the  screening test by insurance may not be accurate  since you have no sx  We could consider doing a formal circulation test   with ABI in future but  will delay that for now. . Would avoid  decongestants as possible  to not trigger  a fib    Blood sugar  A1c today is   Plan fu appt  April or as indicated   Lorene Samaan K. Dinora Hemm M.D.  Record review   has fu appt a fib clinic in January     consider  monitoring for a fib burden and ?  updated   ? Echo.  Glad she is doing well but has had fatigue from meds .   W

## 2024-09-26 ENCOUNTER — Ambulatory Visit (HOSPITAL_COMMUNITY)
Admission: RE | Admit: 2024-09-26 | Discharge: 2024-09-26 | Disposition: A | Discharge status: Home / Self Care | Source: Ambulatory Visit | Attending: Internal Medicine | Admitting: Internal Medicine

## 2024-09-26 ENCOUNTER — Other Ambulatory Visit (HOSPITAL_COMMUNITY): Payer: Self-pay | Admitting: *Deleted

## 2024-09-26 DIAGNOSIS — I4891 Unspecified atrial fibrillation: Secondary | ICD-10-CM

## 2024-09-27 ENCOUNTER — Encounter: Payer: Self-pay | Admitting: Gastroenterology

## 2024-09-27 ENCOUNTER — Ambulatory Visit (AMBULATORY_SURGERY_CENTER): Admitting: Gastroenterology

## 2024-09-27 ENCOUNTER — Other Ambulatory Visit (HOSPITAL_COMMUNITY): Payer: Self-pay

## 2024-09-27 VITALS — BP 91/49 | HR 53 | Temp 97.6°F | Resp 16 | Ht 64.0 in | Wt 140.0 lb

## 2024-09-27 DIAGNOSIS — K2950 Unspecified chronic gastritis without bleeding: Secondary | ICD-10-CM | POA: Diagnosis not present

## 2024-09-27 DIAGNOSIS — K2210 Ulcer of esophagus without bleeding: Secondary | ICD-10-CM

## 2024-09-27 DIAGNOSIS — K209 Esophagitis, unspecified without bleeding: Secondary | ICD-10-CM

## 2024-09-27 DIAGNOSIS — I351 Nonrheumatic aortic (valve) insufficiency: Secondary | ICD-10-CM

## 2024-09-27 DIAGNOSIS — R933 Abnormal findings on diagnostic imaging of other parts of digestive tract: Secondary | ICD-10-CM

## 2024-09-27 DIAGNOSIS — K449 Diaphragmatic hernia without obstruction or gangrene: Secondary | ICD-10-CM

## 2024-09-27 DIAGNOSIS — R1013 Epigastric pain: Secondary | ICD-10-CM

## 2024-09-27 DIAGNOSIS — K2289 Other specified disease of esophagus: Secondary | ICD-10-CM | POA: Diagnosis not present

## 2024-09-27 MED ORDER — PANTOPRAZOLE SODIUM 40 MG PO TBEC: 40.0000 mg | DELAYED_RELEASE_TABLET | Freq: Two times a day (BID) | ORAL | 3 refills | Status: DC

## 2024-09-27 MED ORDER — SODIUM CHLORIDE 0.9 % IV SOLN: 500.0000 mL | Freq: Once | INTRAVENOUS | Status: DC

## 2024-09-27 NOTE — Progress Notes (Signed)
 [facility] History and Physical   Primary Care Physician:  Charlett Apolinar POUR, MD   Reason for Procedure:  Epigastric pain, h/o duodenal diverticula  Plan:    EGD  with possible interventions as needed     HPI: Leslie Reed is a very pleasant 78 y.o. female here for EGD for epigastric pain and evaluation of abnormal CT with duodenal diverticula.  Please refer to office visit note by Jessica Zehr for details  The risks and benefits as well as alternatives of endoscopic procedure(s) have been discussed and reviewed.  The patient was provided an opportunity to ask questions and all were answered. The patient agreed with the plan and demonstrated an understanding of the instructions.   Past Medical History:  Diagnosis Date   Allergic rhinitis    Allergy    Arthritis    hands, lower back   Asthma    Atrial fib/flutter, transient (HCC)    Bronchitis    uses inhaler prn   Cataract    just being monitored - no surgery   Colitis 06/2003   Diverticulitis    GERD (gastroesophageal reflux disease)    diet controlled, no meds   History of migraine headaches    Hx of colonic polyp    Hx of varicella    Hyperlipidemia    Insomnia    Interstitial cystitis 08/1989   Mitral valve prolapse    Osteopenia    Scoliosis 2005   Sleep apnea    Tick bite 2014   STARI-antibiotics x 5 weeks   Tracheobronchopathia-osteochondroplastica     Past Surgical History:  Procedure Laterality Date   ABDOMINAL HYSTERECTOMY     partial  for pain no cancer     ADENOIDECTOMY     APPENDECTOMY     BREAST BIOPSY Right    x 2    BRONCHIAL WASHINGS  04/22/2020   Procedure: BRONCHIAL lavage;  Surgeon: Gretta Leita SQUIBB, DO;  Location: [facility];  Service: Endoscopy;;   CESAREAN SECTION     x4   CHOLECYSTECTOMY     colon polyps     COLONOSCOPY  09/09/2016   Dr.Mortimer Bair   DILATION AND CURETTAGE OF UTERUS     x 2   LAPAROSCOPY  05/01/2024   Procedure: LAPAROSCOPY, DIAGNOSTIC;  Surgeon:  Rubin Calamity, MD;  [facility]: [facility] OR;  Service: General;;   TONSILLECTOMY     VIDEO BRONCHOSCOPY N/A 04/22/2020   Procedure: VIDEO BRONCHOSCOPY WITHOUT FLUORO;  Surgeon: Gretta Leita SQUIBB, DO;  Location: [facility];  Service: Endoscopy;  Laterality: N/A;   WISDOM TOOTH EXTRACTION      Prior to Admission medications   Medication Sig Start Date End Date Taking? Authorizing Provider  apixaban  (ELIQUIS ) 5 MG TABS tablet Take 1 tablet (5 mg total) by mouth 2 (two) times daily. 07/02/24  Yes Terra Fairy PARAS, PA-C  Cholecalciferol (VITAMIN D3) 50 MCG (2000 UT) capsule Take 2 capsules (4,000 Units total) by mouth daily. 02/07/24  Yes Panosh, Apolinar POUR, MD  cyanocobalamin  (VITAMIN B12) 1000 MCG tablet Take 1,000 mcg by mouth daily.   Yes [provider]  metoprolol  succinate (TOPROL  XL) 25 MG 24 hr tablet Take 1 tablet (25 mg total) by mouth at bedtime. Take with or immediately following a meal. 08/20/24  Yes Terra Fairy PARAS, PA-C  UNABLE TO FIND Apply 1 Application topically as needed. Mentholatum Ointment   Yes [provider]  calcium  carbonate (TUMS - DOSED IN MG ELEMENTAL CALCIUM ) 500 MG chewable tablet Chew  2 tablets by mouth as needed for indigestion or heartburn.    [provider]  carboxymethylcellulose (REFRESH PLUS) 0.5 % SOLN Place 1 drop into both eyes as needed.    [provider]  Chlorphen-Pseudoephed-APAP (TYLENOL  ALLERGY SINUS PO) Take 1-2 tablets by mouth as needed.    [provider]  Dihydroxyaluminum Sod Carb (ROLAIDS PO) Take 2 tablets by mouth as needed.    [provider]  melatonin 3 MG TABS tablet Take 3 mg by mouth at bedtime.    [provider]  Minoxidil (ROGAINE EX) Apply 1 Application topically as needed. Patient not taking: No sig reported    [provider]  Olopatadine-Mometasone (RYALTRIS ) 665-25 MCG/ACT SUSP Place 1-2 sprays into the nose in the morning and at bedtime. 04/18/24   Luke Orlan HERO, DO   Respiratory Therapy Supplies (FLUTTER) DEVI 1 each by Does not apply route 2 (two) times daily. Patient not taking: No sig reported 04/01/20   Gretta Leita SQUIBB, DO    Current Outpatient Medications  Medication Sig Dispense Refill   apixaban  (ELIQUIS ) 5 MG TABS tablet Take 1 tablet (5 mg total) by mouth 2 (two) times daily. 60 tablet 6   Cholecalciferol (VITAMIN D3) 50 MCG (2000 UT) capsule Take 2 capsules (4,000 Units total) by mouth daily. 60 capsule 3   cyanocobalamin  (VITAMIN B12) 1000 MCG tablet Take 1,000 mcg by mouth daily.     metoprolol  succinate (TOPROL  XL) 25 MG 24 hr tablet Take 1 tablet (25 mg total) by mouth at bedtime. Take with or immediately following a meal. 30 tablet 2   UNABLE TO FIND Apply 1 Application topically as needed. Mentholatum Ointment     calcium  carbonate (TUMS - DOSED IN MG ELEMENTAL CALCIUM ) 500 MG chewable tablet Chew 2 tablets by mouth as needed for indigestion or heartburn.     carboxymethylcellulose (REFRESH PLUS) 0.5 % SOLN Place 1 drop into both eyes as needed.     Chlorphen-Pseudoephed-APAP (TYLENOL  ALLERGY SINUS PO) Take 1-2 tablets by mouth as needed.     Dihydroxyaluminum Sod Carb (ROLAIDS PO) Take 2 tablets by mouth as needed.     melatonin 3 MG TABS tablet Take 3 mg by mouth at bedtime.     Minoxidil (ROGAINE EX) Apply 1 Application topically as needed. (Patient not taking: No sig reported)     Olopatadine-Mometasone (RYALTRIS ) 665-25 MCG/ACT SUSP Place 1-2 sprays into the nose in the morning and at bedtime. 29 g 5   Respiratory Therapy Supplies (FLUTTER) DEVI 1 each by Does not apply route 2 (two) times daily. (Patient not taking: No sig reported) 1 each 0   Current Facility-Administered Medications  Medication Dose Route Frequency Provider Last Rate Last Admin   0.9 %  sodium chloride  infusion  500 mL Intravenous Once Nariyah Osias V, MD        Allergies as of 09/27/2024   (No Known Allergies)    Family History  Problem Relation Age of  Onset   Diabetes Mother    Pulmonary fibrosis Mother        24 deceased   Hypertension Mother    Hypertension Father    Alzheimer's disease Father    Kidney failure Father    Diabetes Sister    Hypertension Brother    Sleep apnea Brother    Diabetes Maternal Grandmother    Colon cancer Maternal Grandfather    Colon polyps Daughter    Thyroid  disease Daughter    Psoriasis Daughter  Thyroid  disease Daughter    Diabetes Son    Allergy (severe) Son    Depression Son    Esophageal cancer Neg Hx    Rectal cancer Neg Hx    Stomach cancer Neg Hx     Social History   Socioeconomic History   Marital status: Married    Spouse name: Not on file   Number of children: Not on file   Years of education: Not on file   Highest education level: Master's degree (e.g., MA, MS, MEng, MEd, MSW, MBA)  Occupational History   Not on file  Tobacco Use   Smoking status: Never    Passive exposure: Yes   Smokeless tobacco: Never  Vaping Use   Vaping status: Never Used  Substance and Sexual Activity   Alcohol use: Not Currently    Comment: occ   Drug use: No   Sexual activity: Not Currently    Partners: Male    Birth control/protection: Surgical, Post-menopausal    Comment: TAH  Other Topics Concern   Not on file  Social History Narrative   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   hhof 2   G6 p4    Pet cats    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    [address] 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   Neg td    Sleep 4-6 interrupted    Family is WISC father with alzheimer's      3 cups of caffeine  coke and coffee    Social Drivers of Corporate Investment Banker Strain: Low Risk  (07/02/2024)   Overall Financial Resource Strain (CARDIA)    Difficulty of Paying Living Expenses: Not hard at all  Food Insecurity: No Food Insecurity (07/02/2024)   Hunger Vital Sign    Worried About Running Out of Food in the Last Year: Never true    Ran Out of Food in the Last Year: Never true  Transportation Needs:  No Transportation Needs (07/02/2024)   PRAPARE - Administrator, Civil Service (Medical): No    Lack of Transportation (Non-Medical): No  Physical Activity: Sufficiently Active (07/02/2024)   Exercise Vital Sign    Days of Exercise per Week: 5 days    Minutes of Exercise per Session: 60 min  Stress: Stress Concern Present (07/02/2024)   [employer] of Occupational Health - Occupational Stress Questionnaire    Feeling of Stress: To some extent  Social Connections: Moderately Isolated (07/02/2024)   Social Connection and Isolation Panel    Frequency of Communication with Friends and Family: More than three times a week    Frequency of Social Gatherings with Friends and Family: Three times a week    Attends Religious Services: Never    Active Member of Clubs or Organizations: No    Attends Banker Meetings: Not on file    Marital Status: Married  Catering Manager Violence: Not At Risk (05/01/2024)   Humiliation, Afraid, Rape, and Kick questionnaire    Fear of Current or Ex-Partner: No    Emotionally Abused: No    Physically Abused: No    Sexually Abused: No    Review of Systems:  All other review of systems negative except as mentioned in the HPI.  Physical Exam: Vital signs in last 24 hours: Temp 97.6 F (36.4 C) (Temporal)   Ht 5' 4 (1.626 m)   Wt 140 lb (63.5 kg)   LMP 11/29/1986   BMI 24.03 kg/m  General:   Alert, NAD Lungs:  Clear .   Heart:  Regular rate and rhythm Abdomen:  Soft, nontender and nondistended. Neuro/Psych:  Alert and cooperative. Normal mood and affect. A and O x 3  Reviewed labs, radiology imaging, old records and pertinent past GI work up  Patient is appropriate for planned procedure(s) and anesthesia in an ambulatory setting   K. Veena Ravi Tuccillo , MD [phone]

## 2024-09-27 NOTE — Patient Instructions (Addendum)
 Educational handout provided to patient related to GASTRITIS, ESOPHAGITIS, HIATAL HERNIA  Resume previous diet  Continue present medications  Awaiting pathology results   YOU HAD AN ENDOSCOPIC PROCEDURE TODAY AT THE [facility]:   Refer to the procedure report that was given to you for any specific questions about what was found during the examination.  If the procedure report does not answer your questions, please call your gastroenterologist to clarify.  If you requested that your care partner not be given the details of your procedure findings, then the procedure report has been included in a sealed envelope for you to review at your convenience later.  YOU SHOULD EXPECT: Some feelings of bloating in the abdomen. Passage of more gas than usual.  Walking can help get rid of the air that was put into your GI tract during the procedure and reduce the bloating. If you had a lower endoscopy (such as a colonoscopy or flexible sigmoidoscopy) you may notice spotting of blood in your stool or on the toilet paper. If you underwent a bowel prep for your procedure, you may not have a normal bowel movement for a few days.  Please Note:  You might notice some irritation and congestion in your nose or some drainage.  This is from the oxygen used during your procedure.  There is no need for concern and it should clear up in a day or so.  SYMPTOMS TO REPORT IMMEDIATELY:  Following upper endoscopy (EGD)  Vomiting of blood or coffee ground material  New chest pain or pain under the shoulder blades  Painful or persistently difficult swallowing  New shortness of breath  Fever of 100F or higher  Black, tarry-looking stools  For urgent or emergent issues, a gastroenterologist can be reached at any hour by calling [phone]. Do not use MyChart messaging for urgent concerns.    DIET:  We do recommend a small meal at first, but then you may proceed to your regular diet.  Drink plenty of  fluids but you should avoid alcoholic beverages for 24 hours.  ACTIVITY:  You should plan to take it easy for the rest of today and you should NOT DRIVE or use heavy machinery until tomorrow (because of the sedation medicines used during the test).    FOLLOW UP: Our staff will call the number listed on your records the next business day following your procedure.  We will call around 7:15- 8:00 am to check on you and address any questions or concerns that you may have regarding the information given to you following your procedure. If we do not reach you, we will leave a message.     If any biopsies were taken you will be contacted by phone or by letter within the next 1-3 weeks.  Please call us  at [phone] if you have not heard about the biopsies in 3 weeks.    SIGNATURES/CONFIDENTIALITY: You and/or your care partner have signed paperwork which will be entered into your electronic medical record.  These signatures attest to the fact that that the information above on your After Visit Summary has been reviewed and is understood.  Full responsibility of the confidentiality of this discharge information lies with you and/or your care-partner.

## 2024-09-27 NOTE — Op Note (Addendum)
 [facility] Patient Name: Leslie Reed Procedure Date: 09/27/2024 10:39 AM MRN: [id number] Endoscopist: Gustav ALONSO Mcgee , MD, [id number] Age: 78 Referring MD:  Date of Birth: Apr 28, 1946 Gender: Female Account #: [id number] Procedure:                Upper GI endoscopy Indications:              Epigastric abdominal pain, Abnormal CT of the GI                            tract Medicines:                Monitored Anesthesia Care Procedure:                Pre-Anesthesia Assessment:                           - Prior to the procedure, a History and Physical                            was performed, and patient medications and                            allergies were reviewed. The patient's tolerance of                            previous anesthesia was also reviewed. The risks                            and benefits of the procedure and the sedation                            options and risks were discussed with the patient.                            All questions were answered, and informed consent                            was obtained. Prior Anticoagulants: The patient                            last took Eliquis  (apixaban ) 2 days prior to the                            procedure. ASA Grade Assessment: III - A patient                            with severe systemic disease. After reviewing the                            risks and benefits, the patient was deemed in                            satisfactory condition to undergo the procedure.  After obtaining informed consent, the endoscope was                            passed under direct vision. Throughout the                            procedure, the patient's blood pressure, pulse, and                            oxygen saturations were monitored continuously. The                            Olympus Scope SN [id number] was introduced through the                            mouth, and advanced to the  second part of duodenum.                            The upper GI endoscopy was accomplished without                            difficulty. The patient tolerated the procedure                            well. Scope In: Scope Out: Findings:                 LA Grade C (one or more mucosal breaks continuous                            between tops of 2 or more mucosal folds, less than                            75% circumference) esophagitis with no bleeding was                            found 36 to 38 cm from the incisors.                           Two superficial esophageal ulcers with no bleeding                            and no stigmata of recent bleeding were found 37 to                            38 cm from the incisors. The largest lesion was 2                            mm in largest dimension. Biopsies were taken with a                            cold forceps for histology.  A 3 cm hiatal hernia was present.                           Patchy mild inflammation characterized by                            congestion (edema), erythema and friability was                            found in the entire examined stomach. Biopsies were                            taken with a cold forceps for Helicobacter pylori                            testing.                           The cardia and gastric fundus were normal on                            retroflexion.                           The examined duodenum was normal. Complications:            No immediate complications. Estimated Blood Loss:     Estimated blood loss was minimal. Impression:               - LA Grade C erosive esophagitis with no bleeding.                           - Esophageal ulcers with no bleeding and no                            stigmata of recent bleeding. Biopsied.                           - 3 cm hiatal hernia.                           - Gastritis. Biopsied.                           - Normal  examined duodenum. Recommendation:           - Resume previous diet.                           - Continue present medications.                           - Await pathology results.                           - Follow an antireflux regimen.                           -  Use Protonix  (pantoprazole ) 40 mg PO BID. Rx for                            90 days with 3 refills                           - Return to GI office at the next available                            appointment with APP in 1-2 months.                           - Resume Eliquis  (apixaban ) at prior dose tomorrow.                            Refer to managing physician for further adjustment                            of therapy. Murline Weigel V. Nyisha Clippard, MD 09/27/2024 11:02:58 AM This report has been signed electronically.

## 2024-09-27 NOTE — Progress Notes (Signed)
 Called to room to assist during endoscopic procedure.  Patient ID and intended procedure confirmed with present staff. Received instructions for my participation in the procedure from the performing physician.

## 2024-09-27 NOTE — Progress Notes (Signed)
 Vss nad trans to pacu

## 2024-09-27 NOTE — Progress Notes (Signed)
 Pt's states no medical or surgical changes since previsit or office visit.

## 2024-09-28 ENCOUNTER — Telehealth: Payer: Self-pay | Admitting: *Deleted

## 2024-09-28 NOTE — Telephone Encounter (Signed)
 No answer post call. Unable to leave a message. Mailbox full.

## 2024-10-01 ENCOUNTER — Encounter: Payer: Self-pay | Admitting: Internal Medicine

## 2024-10-01 DIAGNOSIS — R809 Proteinuria, unspecified: Secondary | ICD-10-CM

## 2024-10-01 LAB — SURGICAL PATHOLOGY

## 2024-10-02 NOTE — Telephone Encounter (Signed)
 I don't think related  but  we can  order a urine microalbumin test in urine ' \ I would have  you get back with the gi team as to their  opinion about the abd cramping  and then go from there   IE ask the gi team what they think about your pain   And next step of any  evaluation  or intervention.   I ordered a fu urine for protein  in system

## 2024-10-05 ENCOUNTER — Telehealth: Payer: Self-pay

## 2024-10-05 ENCOUNTER — Other Ambulatory Visit (INDEPENDENT_AMBULATORY_CARE_PROVIDER_SITE_OTHER)

## 2024-10-05 DIAGNOSIS — R809 Proteinuria, unspecified: Secondary | ICD-10-CM

## 2024-10-05 NOTE — Telephone Encounter (Signed)
 Spoke with patient. She calls to reschedule her appointment of 10/16/24 due to dental surgery. Rescheduled to 11/06/24.

## 2024-10-05 NOTE — Telephone Encounter (Signed)
 Attempted to reach pt.   Left a detail message to proceed to nearest UC if sx persistent or worse as our office is closing soon no available appointment. Will forward her mychart message to her GI team.

## 2024-10-08 ENCOUNTER — Other Ambulatory Visit: Payer: Self-pay

## 2024-10-08 ENCOUNTER — Other Ambulatory Visit (INDEPENDENT_AMBULATORY_CARE_PROVIDER_SITE_OTHER)

## 2024-10-08 ENCOUNTER — Other Ambulatory Visit

## 2024-10-08 ENCOUNTER — Ambulatory Visit: Payer: Self-pay | Admitting: Gastroenterology

## 2024-10-08 DIAGNOSIS — R197 Diarrhea, unspecified: Secondary | ICD-10-CM | POA: Diagnosis not present

## 2024-10-08 LAB — BASIC METABOLIC PANEL WITH GFR
BUN: 14 mg/dL (ref 6–23)
CO2: 23 meq/L (ref 19–32)
Calcium: 8.8 mg/dL (ref 8.4–10.5)
Chloride: 107 meq/L (ref 96–112)
Creatinine, Ser: 0.76 mg/dL (ref 0.40–1.20)
GFR: 75.52 mL/min (ref >60.00)
Glucose, Bld: 98 mg/dL (ref 70–99)
Potassium: 3.7 meq/L (ref 3.5–5.1)
Sodium: 140 meq/L (ref 135–145)

## 2024-10-08 LAB — MICROALBUMIN / CREATININE URINE RATIO
Creatinine,U: 177.6 mg/dL
Microalb Creat Ratio: 4.5 mg/g (ref 0.0–30.0)
Microalb, Ur: 0.8 mg/dL (ref 0.0–1.9)

## 2024-10-08 NOTE — Telephone Encounter (Signed)
 Please check C.diff and BMP stat. Increase fluid intake, please advise her to come to urgent care if symptoms worsen

## 2024-10-08 NOTE — Telephone Encounter (Signed)
 Patient has developed urgent explosive diarrhea. This is day 3 of her symptoms. Low abdominal cramping. Afebrile. Symptoms are worse at night. Stools 8 to 10 times in a night. No antibiotics in 6+ months. No vomiting.

## 2024-10-08 NOTE — Telephone Encounter (Signed)
 Patient advised and agrees to the plan of care. She will increase fluids. Urgent care if she acutely worsens.

## 2024-10-08 NOTE — Telephone Encounter (Signed)
 Patient called stating she has been having significant diarrhea over the weekend. States she had diarrhea 10 times Friday night. States there has been mucous in her stool along with a yellow/orange color. Patient is requesting to know if she is able to take pepto bismol. Please advise, thank you.

## 2024-10-09 ENCOUNTER — Ambulatory Visit (HOSPITAL_COMMUNITY)

## 2024-10-09 LAB — CLOSTRIDIUM DIFFICILE BY PCR: Toxigenic C. Difficile by PCR: NEGATIVE

## 2024-10-10 ENCOUNTER — Ambulatory Visit: Payer: Self-pay | Admitting: Gastroenterology

## 2024-10-10 ENCOUNTER — Ambulatory Visit: Payer: Self-pay | Admitting: Internal Medicine

## 2024-10-10 NOTE — Progress Notes (Signed)
 No excess protein   in urine .  Favorable result.

## 2024-10-10 NOTE — Telephone Encounter (Signed)
 Spoke with patient .  Provided results and recommendations. Patient asking how long a virus lasts. Advised up to a week sometimes longer.  Currently symptoms are improving.  Patient will let us  know if she is still having issues next week.  Patient will also focus on hydration.

## 2024-10-15 ENCOUNTER — Ambulatory Visit: Admitting: Neurology

## 2024-10-15 ENCOUNTER — Encounter: Payer: Self-pay | Admitting: Neurology

## 2024-10-15 ENCOUNTER — Telehealth: Payer: Self-pay | Admitting: Adult Health

## 2024-10-15 VITALS — BP 130/78 | HR 71 | Ht 64.0 in | Wt 141.0 lb

## 2024-10-15 DIAGNOSIS — G4733 Obstructive sleep apnea (adult) (pediatric): Secondary | ICD-10-CM

## 2024-10-15 DIAGNOSIS — R413 Other amnesia: Secondary | ICD-10-CM | POA: Diagnosis not present

## 2024-10-15 DIAGNOSIS — D801 Nonfamilial hypogammaglobulinemia: Secondary | ICD-10-CM

## 2024-10-15 DIAGNOSIS — J479 Bronchiectasis, uncomplicated: Secondary | ICD-10-CM | POA: Diagnosis not present

## 2024-10-15 NOTE — Telephone Encounter (Addendum)
 Spoke to patient made her aware order was signed by  provider and faxed over to [facility] this afternoon

## 2024-10-15 NOTE — Progress Notes (Signed)
 Provider:  Dedra Gores, MD  Primary Care Physician:  Leslie Apolinar POUR, MD [address] [place] [address]     Referring Provider: Charlett Apolinar POUR, Md [address] [place],  [place] [address]          Chief Complaint according to patient   Patient presents with:          Patient is here alone for memory - has an appointment mid December with a Neuropsychologist in [facility] . Struggling with dental surgery  With IBS.  With physical fatigue.  Hospitalized in June , currently wearing a heart monitor.       HISTORY OF PRESENT ILLNESS:  Leslie Reed is a 78 y.o. female patient who is here for revisit 10/15/2024 for  follow up on memory impairment,  Foggy minded, trouble to focus, off balance, trouble retrieving names and words.  Possibly  Vertigo-  she has more of it when she wears her glasses. ( Eye correction) has tinnitus,  Feeling sea- legged.    It has been a long time sine I felt focussed and clear headed. My new COAP doesn't give AHI info and I feel less stressed about my apnea this way .  Synapse.  ResMed machine was renewed last year.  I stay home with my son , who is on a transplant list.     09/24/24:MM   Leslie Reed is a 78 y.o. female with a history of OSA on CPAP. Returns today for follow-up.  Reports that CPAP is working well for her now that she has a new machine.  She states that she is now using it longer.  She currently has the nasal mask.  Does states she needs new supplies.  Her download is below.  Leslie Reed is a 78 y.o. female. Patient with history of hypertension on olmesartan, recent admission for GI inflammation --presents to the emergency department today for evaluation of atrial fibrillation with RVR.  This is a new diagnosis for the patient.  She was at the GI office this morning with plans to have an endoscopy after her recent hospitalization.  She was found to have elevated heart rate, irregular rate,  120-140.  Patient is essentially asymptomatic.  She is continued to be very active and has not had any fatigue, shortness of breath, or decreased exercise tolerance.  No lightheadedness or syncope.  No shortness of breath or leg swelling.  No chest pain.  Patient has been n.p.o. today.  She does endorse about 2 cups of coffee per day.  No current GI bleeding.  No history of diabetes.  No history of heart failure.      Leslie Reed is a 78 y.o. female and seen here upon referral from Dr. Charlett for a Consultation/ Evaluation of memory loss.    This patient reports onset of memory loss over a period of 7 years. Just last week she started BP medications, she has had extraordinary challenges and stressors, her son Leslie Reed is now on a pancreas transplant list.   She already started on an antibiotic last week as well and she has been on antiallergy medicine and antiacids.  She has obstructive sleep apnea patient here also uses CPAP compliantly 100% also she declares she does not like it.  It is an auto titration CPAP machine between 5 and 15 cm water with 3 cm EPR and her residual AHI is 2.9 and her  95th percentile pressure is close to 12 cm of water.  Here I see no reason to do any changes okay.  The Epworth sleepiness score is endorsed at 2 points fatigue severity at 15 points geriatric depression scored 4 out of 15 points.  Coming back to her cognitive status she endorsed all activities of daily living as independent so transportation managing finances communication.  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  She has 4 adult children.   Today's Montreal cognitive assessment test she scored 24 out of 30 points.  The  clock, trail making and cube were fine,  naming the animals correctly,  doing serial 7th , but only remembered 1 out of 5 words.    Fam Hx: Father had dementia, mother died too young idiopathic  pulmonary fibrosis.            Leslie Reed is a 78 y.o. female , seen here as in a referral from Dr. Charlett. She had seen me  or  years ago for memory. Had MRI - negative.  I have also followed Leslie Reed son Leslie Reed, here in the clinic.  Her concern today is not so much directed toward sleep, she is concerned about her progressive memory deficits.  He has mostly insomnia concerns.  She reports weight gain, daytime fatigue chest pain back pain, hearing loss and ringing in her ears, memory loss, headaches insomnia and restless legs the feeling of not getting enough sleep or not enough quality sleep.  She has a history of cholecystectomy 2 breast tumors surgically removed appendectomy, polypectomy during colonoscopy, she had 4 C-sections, and she is status post partial hysterectomy.  Her only medication at this time is vitamin D  50,000 units.    Chief complaint according to patient : Leslie Reed is concerned about memory deficits, attention deficits, and still has poor sleep and insomnia issues. She has a feeling that she cannot access her vocabulary, she also gave anecdotal evidence.  She met with friends all of them had watch the same movie but she could not recall the content of the story line.  She has difficulties remembering names of sports, she has difficulties recalling names of colleagues from 30 years ago that she would otherwise felt easy to recall.  She was usually putting herself on her photographic memory but she feels now that if she does not repeat certain information over and over or writes it down it would not be accessible a week later.  Immediate recall is not her problem - it is the intermediate recall.       Review of Systems: Out of a complete 14 system review, the patient complains of only the following symptoms, and all other reviewed systems are negative.:       No data to display              10/15/2024    3:49 PM 04/26/2024    3:00 PM 12/07/2018    9:25 AM  11/17/2017   11:21 AM  Montreal Cognitive Assessment   Visuospatial/ Executive (0/5) 5 4 5 5   Naming (0/3) 3 3 3 3   Attention: Read list of digits (0/2) 1 1 2 2   Attention: Read list of letters (0/1) 1 1 1 1   Attention: Serial 7 subtraction starting at 100 (0/3) 3 3 3 3   Language: Repeat phrase (0/2) 2 2 2 2   Language : Fluency (  0/1) 1 1 1 1   Abstraction (0/2) 2 2 2 2   Delayed Recall (0/5) 3 1 3 2   Orientation (0/6) 5 5 6 6   Total 25 23 28 27   Adjusted Score (based on education) 26             Social History   Socioeconomic History   Marital status: Married    Spouse name: Not on file   Number of children: Not on file   Years of education: Not on file   Highest education level: Master's degree (e.g., MA, MS, MEng, MEd, MSW, MBA)  Occupational History   Not on file  Tobacco Use   Smoking status: Never    Passive exposure: Yes   Smokeless tobacco: Never  Vaping Use   Vaping status: Never Used  Substance and Sexual Activity   Alcohol use: Not Currently    Comment: occ   Drug use: No   Sexual activity: Not Currently    Partners: Male    Birth control/protection: Surgical, Post-menopausal    Comment: TAH  Other Topics Concern   Not on file  Social History Narrative   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   hhof 2   G6 p4    Pet cats    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    [address] 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   Neg td    Sleep 4-6 interrupted    Family is WISC father with alzheimer's      1-2 cups of caffeine  coke and coffee - cutting back    Social Drivers of Corporate Investment Banker Strain: Low Risk  (07/02/2024)   Overall Financial Resource Strain (CARDIA)    Difficulty of Paying Living Expenses: Not hard at all  Food Insecurity: No Food Insecurity (07/02/2024)   Hunger Vital Sign    Worried About Running Out of Food in the Last Year: Never true    Ran Out of Food in the Last Year: Never true  Transportation Needs: No Transportation Needs (07/02/2024)    PRAPARE - Administrator, Civil Service (Medical): No    Lack of Transportation (Non-Medical): No  Physical Activity: Sufficiently Active (07/02/2024)   Exercise Vital Sign    Days of Exercise per Week: 5 days    Minutes of Exercise per Session: 60 min  Stress: Stress Concern Present (07/02/2024)   [employer] of Occupational Health - Occupational Stress Questionnaire    Feeling of Stress: To some extent  Social Connections: Moderately Isolated (07/02/2024)   Social Connection and Isolation Panel    Frequency of Communication with Friends and Family: More than three times a week    Frequency of Social Gatherings with Friends and Family: Three times a week    Attends Religious Services: Never    Active Member of Clubs or Organizations: No    Attends Engineer, Structural: Not on file    Marital Status: Married    Family History  Problem Relation Age of Onset   Diabetes Mother    Pulmonary fibrosis Mother        43 deceased   Hypertension Mother    Hypertension Father    Alzheimer's disease Father    Kidney failure Father    Diabetes Sister    Hypertension Brother    Sleep apnea Brother    Diabetes Maternal Grandmother    Colon cancer Maternal Grandfather    Colon polyps Daughter    Thyroid  disease Daughter    Psoriasis  Daughter    Thyroid  disease Daughter    Diabetes Son    Allergy (severe) Son    Depression Son    Esophageal cancer Neg Hx    Rectal cancer Neg Hx    Stomach cancer Neg Hx    Migraines Neg Hx    Seizures Neg Hx    Stroke Neg Hx     Past Medical History:  Diagnosis Date   Allergic rhinitis    Allergy    Arthritis    hands, lower back   Asthma    Atrial fib/flutter, transient (HCC)    Bronchitis    uses inhaler prn   Cataract    just being monitored - no surgery   Colitis 06/2003   Diverticulitis    GERD (gastroesophageal reflux disease)    diet controlled, no meds   History of migraine headaches    Hx of colonic  polyp    Hx of varicella    Hyperlipidemia    Insomnia    Interstitial cystitis 08/1989   Mitral valve prolapse    Osteopenia    Scoliosis 2005   Sleep apnea    Tick bite 2014   STARI-antibiotics x 5 weeks   Tracheobronchopathia-osteochondroplastica     Past Surgical History:  Procedure Laterality Date   ABDOMINAL HYSTERECTOMY     partial  for pain no cancer     ADENOIDECTOMY     APPENDECTOMY     BREAST BIOPSY Right    x 2    BRONCHIAL WASHINGS  04/22/2020   Procedure: BRONCHIAL lavage;  Surgeon: Gretta Leita SQUIBB, DO;  Location: [facility];  Service: Endoscopy;;   CESAREAN SECTION     x4   CHOLECYSTECTOMY     colon polyps     COLONOSCOPY  09/09/2016   Dr.Nandigam   DILATION AND CURETTAGE OF UTERUS     x 2   LAPAROSCOPY  05/01/2024   Procedure: LAPAROSCOPY, DIAGNOSTIC;  Surgeon: Rubin Calamity, MD;  Location: [facility] OR;  Service: General;;   TONSILLECTOMY     VIDEO BRONCHOSCOPY N/A 04/22/2020   Procedure: VIDEO BRONCHOSCOPY WITHOUT FLUORO;  Surgeon: Gretta Leita SQUIBB, DO;  Location: [facility];  Service: Endoscopy;  Laterality: N/A;   WISDOM TOOTH EXTRACTION       Current Outpatient Medications on File Prior to Visit  Medication Sig Dispense Refill   apixaban  (ELIQUIS ) 5 MG TABS tablet Take 1 tablet (5 mg total) by mouth 2 (two) times daily. 60 tablet 6   calcium  carbonate (TUMS - DOSED IN MG ELEMENTAL CALCIUM ) 500 MG chewable tablet Chew 2 tablets by mouth as needed for indigestion or heartburn.     carboxymethylcellulose (REFRESH PLUS) 0.5 % SOLN Place 1 drop into both eyes as needed.     Chlorphen-Pseudoephed-APAP (TYLENOL  ALLERGY SINUS PO) Take 1-2 tablets by mouth as needed.     Cholecalciferol (VITAMIN D3) 50 MCG (2000 UT) capsule Take 2 capsules (4,000 Units total) by mouth daily. 60 capsule 3   cyanocobalamin  (VITAMIN B12) 1000 MCG tablet Take 1,000 mcg by mouth daily.     Dihydroxyaluminum Sod Carb (ROLAIDS PO) Take 2 tablets by mouth as needed.     melatonin 3 MG  TABS tablet Take 3 mg by mouth at bedtime.     metoprolol  succinate (TOPROL  XL) 25 MG 24 hr tablet Take 1 tablet (25 mg total) by mouth at bedtime. Take with or immediately following a meal. 30 tablet 2   Olopatadine-Mometasone (RYALTRIS ) 665-25 MCG/ACT SUSP Place 1-2 sprays  into the nose in the morning and at bedtime. 29 g 5   pantoprazole  (PROTONIX ) 40 MG tablet Take 1 tablet (40 mg total) by mouth 2 (two) times daily. 180 tablet 3   UNABLE TO FIND Apply 1 Application topically as needed. Mentholatum Ointment     Minoxidil (ROGAINE EX) Apply 1 Application topically as needed. (Patient not taking: Reported on 10/15/2024)     Respiratory Therapy Supplies (FLUTTER) DEVI 1 each by Does not apply route 2 (two) times daily. (Patient not taking: Reported on 10/15/2024) 1 each 0   No current facility-administered medications on file prior to visit.    No Known Allergies   DIAGNOSTIC DATA (LABS, IMAGING, TESTING) - I reviewed patient records, labs, notes, testing and imaging myself where available.  Lab Results  Component Value Date   WBC 5.7 08/02/2024   HGB 13.1 08/02/2024   HCT 40.1 08/02/2024   MCV 93 08/02/2024   PLT 329 08/02/2024      Component Value Date/Time   NA 140 10/08/2024 1414   NA 143 04/26/2024 1609   K 3.7 10/08/2024 1414   CL 107 10/08/2024 1414   CO2 23 10/08/2024 1414   GLUCOSE 98 10/08/2024 1414   BUN 14 10/08/2024 1414   BUN 21 04/26/2024 1609   CREATININE 0.76 10/08/2024 1414   CREATININE 0.66 07/01/2015 1415   CALCIUM  8.8 10/08/2024 1414   PROT 6.6 05/23/2024 1151   PROT 6.7 04/26/2024 1609   ALBUMIN  4.4 05/23/2024 1151   ALBUMIN  4.8 04/26/2024 1609   AST 17 05/23/2024 1151   ALT 14 05/23/2024 1151   ALKPHOS 63 05/23/2024 1151   BILITOT 0.4 05/23/2024 1151   BILITOT 0.3 04/26/2024 1609   GFRNONAA >60 06/27/2024 1051   Lab Results  Component Value Date   CHOL 200 02/07/2024   HDL 64.00 02/07/2024   LDLCALC 119 (H) 02/07/2024   LDLDIRECT 138.0  10/13/2011   TRIG 85.0 02/07/2024   CHOLHDL 3 02/07/2024   Lab Results  Component Value Date   HGBA1C 5.6 09/25/2024   Lab Results  Component Value Date   VITAMINB12 1,010 04/26/2024   Lab Results  Component Value Date   TSH 1.350 06/27/2024    PHYSICAL EXAM:  Vitals:   10/15/24 1539  BP: 130/78  Pulse: 71  SpO2: 97%   No data found. Body mass index is 24.2 kg/m.   Wt Readings from Last 3 Encounters:  10/15/24 141 lb (64 kg)  09/27/24 140 lb (63.5 kg)  09/25/24 145 lb (65.8 kg)     Ht Readings from Last 3 Encounters:  10/15/24 5' 4 (1.626 m)  09/27/24 5' 4 (1.626 m)  09/25/24 5' 4 (1.626 m)      General: The patient is awake, alert and appears not in acute distress and groomed. Head: Normocephalic, atraumatic.  Neck is supple. Mallampati 2,  neck circumference:14 inches .   Nasal airflow is not fully  patent.   Overbite Dwan is  seen.  Dental status: biological  Cardiovascular:  Regular rate and cardiac rhythm by pulse, without distended neck veins. Respiratory: no shortness of breath  Skin:  Without evidence of ankle edema, or rash. Trunk: BMI is 24    NEUROLOGIC EXAM: The patient is awake and alert, oriented to place and time.   Memory subjective described as intact.  Attention span & concentration ability appears normal.   Speech is fluent,  without  dysarthria, dysphonia or aphasia.  Mood and affect are appropriate.   Neurological  Examination: Mental Status: Intact. Language and speech are normal. No cognitive deficits noticed in  interview or examination, aside from [facility] 26/ 30  Cranial Nerves II-XII: Intact. PERL. EOMI. VFF. No nystagmus.  No facial droop.  No ptosis.  Hearing  loss, mild- moderate = low frequency.  Meniere  Tinnitus, fullness feeling in the ears    The tongue is normal and midline. Motor: Strengths are 5/5 throughout. Muscle bulk and tone are normal. No tremors.  Coordination: No ataxia or dysmetria.   Sensory: Grossly intact throughout to all modalities. Reflexes: Normal and symmetric throughout. No ankle clonus. Babinski's sign is absent bilaterally. Hoffman's sign is absent bilaterally. Gait and Station: Normal.  Romberg's sign is positive, swaying backwards.   ASSESSMENT AND PLAN :   78 y.o. year old female  here with:  Fatigue and lightheadedness,   head congestion some symptoms reminiscent of  vertigo/ vestibular dysfunction.     1)  Meniere's disease?   Audiology testing recommended.   2)  More dizziness in the last 2-3 weeks, since she started minoxidil. ! Even topical application can cause this.   3)  positive Romberg - unclear origin . Make sure you have no Vit B deficiency - you are not anemic, TSH normal , electrolytes were normal,  supplement Vit D.   4) dizziness is orthostatic / she feels better when having a cup of coffee and a glass of water. Keep hydrating.!       I would like to thank  Panosh, Wanda K, Md [address] [place],  [place] [address] for allowing me to meet with this pleasant patient.   Sleep Clinic Patients are generally offered input on sleep hygiene, life style changes and how to improve compliance with medical treatment where applicable. Review and reiteration of good sleep hygiene measures is offered to any sleep clinic patient, be it in the first consultation or with any follow up visits. Any patient with sleepiness should be cautioned not to drive, work at heights, or operate dangerous or heavy equipment when feeling tired or sleepy.  The patient will be seen in follow-up in the sleep clinic at [facility] for discussion of test results, sleep related symptoms and treatment compliance review, further management strategies, etc.   The referring provider will be notified of the test results.   The patient's condition requires frequent monitoring and adjustments in the treatment plan, reflecting the ongoing complexity of care.  This provider is  the continuing focal point for all needed services for this condition.  After spending a total time of  45  minutes face to face and time for  history taking, physical and neurologic examination, review of laboratory studies,  personal review of imaging studies, reports and results of other testing and review of referral information / records as far as provided in visit,   Electronically signed by: Leslie Gores, MD 10/15/2024 4:11 PM  [facility] Board certified by The [employer] of Sleep Medicine and Diplomate of the [employer] of Sleep Medicine. Board certified In Neurology through the ABPN, Fellow of the [employer] of Neurology.

## 2024-10-15 NOTE — Telephone Encounter (Signed)
 Pt in office for appt, states she needs her CPAP prescription signed and sent to [facility] Fax: [phone] phone: [phone]. States she spoke with [facility] and they will not renew her supplies bc of her insurance being united [employer] medicare.

## 2024-10-15 NOTE — Patient Instructions (Signed)
 ASSESSMENT AND PLAN :    78 y.o. year old female  here with:   Fatigue and lightheadedness,   head congestion some symptoms reminiscent of  vertigo/ vestibular dysfunction.      1)  Meniere's disease?   Audiology testing recommended. Please have your hearing checked.    2)  More dizziness in the last 2-3 weeks, since she started minoxidil. ! Even topical application can cause this.    3)  positive Romberg - unclear origin . Make sure you have no Vit B deficiency - you are not anemic, TSH normal , electrolytes were normal,  supplement Vit D.     4) dizziness is orthostatic / she feels better when having a cup of coffee and a glass of water. Keep hydrating.!

## 2024-10-16 ENCOUNTER — Ambulatory Visit: Admitting: Gastroenterology

## 2024-10-17 ENCOUNTER — Encounter: Payer: Self-pay | Admitting: Allergy

## 2024-10-17 ENCOUNTER — Ambulatory Visit: Payer: PRIVATE HEALTH INSURANCE | Admitting: Allergy

## 2024-10-17 VITALS — BP 104/86 | HR 68 | Ht 64.0 in | Wt 139.0 lb

## 2024-10-17 DIAGNOSIS — J479 Bronchiectasis, uncomplicated: Secondary | ICD-10-CM | POA: Diagnosis not present

## 2024-10-17 DIAGNOSIS — D801 Nonfamilial hypogammaglobulinemia: Secondary | ICD-10-CM

## 2024-10-17 DIAGNOSIS — R42 Dizziness and giddiness: Secondary | ICD-10-CM | POA: Diagnosis not present

## 2024-10-17 DIAGNOSIS — J310 Chronic rhinitis: Secondary | ICD-10-CM | POA: Diagnosis not present

## 2024-10-17 DIAGNOSIS — R198 Other specified symptoms and signs involving the digestive system and abdomen: Secondary | ICD-10-CM

## 2024-10-17 DIAGNOSIS — K219 Gastro-esophageal reflux disease without esophagitis: Secondary | ICD-10-CM

## 2024-10-17 MED ORDER — RYALTRIS 665-25 MCG/ACT NA SUSP: 1.0000 | Freq: Two times a day (BID) | NASAL | 5 refills | Status: AC

## 2024-10-17 NOTE — Progress Notes (Signed)
 Follow Up Note  RE: Leslie Reed MRN: [id number] DOB: 1945-12-14 Date of Office Visit: 10/17/2024  Referring provider: Panosh, Wanda K, MD Primary care provider: Charlett Apolinar POUR, MD  Chief Complaint: Follow-up  History of Present Illness: I had the pleasure of seeing Leslie Reed for a follow up visit at the [facility] on 10/17/2024. She is a 78 y.o. female, who is being followed for hypogammaglobulinemia, bronchiectasis, chronic rhinitis. Her previous allergy office visit was on 04/18/2024 with Dr. Luke. Today is a regular follow up visit.  Discussed the use of AI scribe software for clinical note transcription with the patient, who gave verbal consent to proceed.    Over the past six months, she has experienced a series of health challenges. Initially, she had a bad taste in her mouth and drainage, treated with antibiotics for a suspected sinus infection. This was followed by severe gastrointestinal symptoms, leading to an ER visit and a six-day hospital stay where diverticulitis in the upper intestine were discovered. An endoscopy later revealed Barrett's esophagus.  Two months after the hospital stay, she was diagnosed with atrial fibrillation during a pre-endoscopy evaluation, which delayed the procedure. She was treated and stabilized with medication. She is on blood pressure medication and Eliquis  for her AFib.  Recently, she experienced five days of persistent diarrhea, which resolved temporarily with Pepto Bismol but recurred after resuming a regular diet. She managed her symptoms with dietary adjustments and Pepto Bismol to prepare for dental surgery. She started amoxicillin  last night. Severe abdominal pain and cramping preceded the diarrhea, with pain located on both sides. A stool sample and urinalysis showed no significant findings.  She reports a recent injury to her finger, resulting in nerve damage and numbness, and a fall that caused chest pain, which  she suspects might be a rib injury. She is managing the pain with Tylenol .  She mentions experiencing vertigo, which she attributes to her current medications. She is also using minoxidil for hair loss, which she recently learned can affect blood pressure. She describes a sensation of fluid in her ears and occasional vertigo. She experiences a 'heavy head' and occasional dizziness, which she attributes to her medications and possibly her eyeglasses. She has not been taking her nasal sprays daily.  She mentions a history of memory issues and has seen a neurologist for evaluation. She is concerned about her insurance coverage and the financial implications of her medical care. She is currently managing a holiday representative business, which impacts her financial situation and stress levels.  No excessive coughing but occasional drainage-related cough, especially in the morning. Sensation of fluid in her ears and occasional vertigo.     Assessment and Plan: Leslie Reed is a 78 y.o. female with: Hypogammaglobulinemia (HCC) Past history - Patient referred by pulmonology for low IgG and IgM. History of frequent respiratory infections, mild central bronchiectasis, mother who passed away from IPF. Up to date with COVID-19 vaccine, flu, pneumovax and prevnar.  2021 bloodwork diptheria, tetanus and s. Pneumonia titers adequate. Alpha 1 level and total complement level normal. No history of meningitis or blood clots. Arthralgias and headaches improved with Cuvitru 20g every 4 weeks. Last Cuvitru infusion in February 2023. Shingles in 2023. 2024 IgG 611. Interim history - finished Augmentin  May and patient was hospitalized for a perforate duodenal ulcer.  Keep track of infections and antibiotics use.  Get repeat labs.    Bronchiectasis without complication [facility]) Past history - 2022 spirometry normal. 2023 CT  chest.  Interim history - some coughing.  Monitor symptoms.    Chronic rhinitis/dizziness Past history -  Perennial rhinitis symptoms and using Vick's vapor rub. 2021 skin testing negative to environmental allergies.  Interim history - PND and ear fullness/dizziness but only uses nasal sprays prn.  Use Ryaltris  (olopatadine + mometasone nasal spray combination) 1-2 sprays per nostril twice a day.  This replaces your other nasal sprays. If this works well for you, then have pharmacy ship the medication to your home - prescription already sent in.  Nasal saline spray (i.e., Simply Saline) or nasal saline lavage (i.e., NeilMed) is recommended as needed and prior to medicated nasal sprays. If the dizziness is not better - then may need to see ENT as well.   Reflux/GI symptoms Continue to follow up with GI. Continue lifestyle and dietary modifications. Protonix  40mg  once day - nothing to eat or drink for 20-30 minutes afterwards.    Return in about 6 months (around 04/16/2025).  Meds ordered this encounter  Medications   Olopatadine-Mometasone (RYALTRIS ) 665-25 MCG/ACT SUSP    Sig: Place 1-2 sprays into the nose in the morning and at bedtime.    Dispense:  29 g    Refill:  5   Lab Orders         Strep pneumoniae 23 Serotypes IgG         IgG, IgA, IgM      Diagnostics: None.   Medication List:  Current Outpatient Medications  Medication Sig Dispense Refill   apixaban  (ELIQUIS ) 5 MG TABS tablet Take 1 tablet (5 mg total) by mouth 2 (two) times daily. 60 tablet 6   calcium  carbonate (TUMS - DOSED IN MG ELEMENTAL CALCIUM ) 500 MG chewable tablet Chew 2 tablets by mouth as needed for indigestion or heartburn.     carboxymethylcellulose (REFRESH PLUS) 0.5 % SOLN Place 1 drop into both eyes as needed.     Chlorphen-Pseudoephed-APAP (TYLENOL  ALLERGY SINUS PO) Take 1-2 tablets by mouth as needed.     Cholecalciferol (VITAMIN D3) 50 MCG (2000 UT) capsule Take 2 capsules (4,000 Units total) by mouth daily. 60 capsule 3   cyanocobalamin  (VITAMIN B12) 1000 MCG tablet Take 1,000 mcg by mouth daily.      Dihydroxyaluminum Sod Carb (ROLAIDS PO) Take 2 tablets by mouth as needed.     melatonin 3 MG TABS tablet Take 3 mg by mouth at bedtime.     metoprolol  succinate (TOPROL  XL) 25 MG 24 hr tablet Take 1 tablet (25 mg total) by mouth at bedtime. Take with or immediately following a meal. 30 tablet 2   Minoxidil (ROGAINE EX) Apply 1 Application topically as needed.     pantoprazole  (PROTONIX ) 40 MG tablet Take 1 tablet (40 mg total) by mouth 2 (two) times daily. 180 tablet 3   Respiratory Therapy Supplies (FLUTTER) DEVI 1 each by Does not apply route 2 (two) times daily. 1 each 0   UNABLE TO FIND Apply 1 Application topically as needed. Mentholatum Ointment     Olopatadine-Mometasone (RYALTRIS ) 665-25 MCG/ACT SUSP Place 1-2 sprays into the nose in the morning and at bedtime. 29 g 5   No current facility-administered medications for this visit.   Allergies: No Known Allergies I reviewed her past medical history, social history, family history, and environmental history and no significant changes have been reported from her previous visit.  Review of Systems  Constitutional:  Negative for appetite change, chills, fever and unexpected weight change.  HENT:  Positive for  postnasal drip. Negative for congestion and rhinorrhea.   Eyes:  Negative for itching.  Respiratory:  Positive for cough. Negative for chest tightness, shortness of breath and wheezing.   Cardiovascular:  Negative for chest pain.  Gastrointestinal:  Negative for abdominal pain.  Genitourinary:  Negative for difficulty urinating.  Allergic/Immunologic: Negative for environmental allergies and food allergies.    Objective: BP 104/86   Pulse 68   Ht 5' 4 (1.626 m)   Wt 139 lb (63 kg)   LMP 11/29/1986   SpO2 98%   BMI 23.86 kg/m  Body mass index is 23.86 kg/m. Physical Exam Vitals and nursing note reviewed.  Constitutional:      Appearance: Normal appearance. She is well-developed.  HENT:     Head: Normocephalic and  atraumatic.     Right Ear: Tympanic membrane and external ear normal.     Left Ear: Tympanic membrane and external ear normal.     Nose: Nose normal. No congestion or rhinorrhea.     Mouth/Throat:     Mouth: Mucous membranes are moist.     Pharynx: Oropharynx is clear.  Eyes:     Conjunctiva/sclera: Conjunctivae normal.  Cardiovascular:     Rate and Rhythm: Normal rate and regular rhythm.     Heart sounds: Normal heart sounds. No murmur heard.    No friction rub. No gallop.  Pulmonary:     Effort: Pulmonary effort is normal.     Breath sounds: Normal breath sounds. No wheezing, rhonchi or rales.  Musculoskeletal:     Cervical back: Neck supple.  Skin:    General: Skin is warm.  Neurological:     Mental Status: She is alert and oriented to person, place, and time.  Psychiatric:        Mood and Affect: Mood normal.        Behavior: Behavior normal.    Previous notes and tests were reviewed. The plan was reviewed with the patient/family, and all questions/concerned were addressed.  It was my pleasure to see Leslie Reed today and participate in her care. Please feel free to contact me with any questions or concerns.  Sincerely,  Orlan Cramp, DO Allergy & Immunology  [facility]  [place] office: [phone] [facility] office: [phone]

## 2024-10-17 NOTE — Patient Instructions (Addendum)
 Low IgG:  Keep track of infections and antibiotics use.  Get bloodwork We are ordering labs, so please allow 1-2 weeks for the results to come back. With the newly implemented Cures Act, the labs might be visible to you at the same time that they become visible to me. However, I will not address the results until all of the results are back, so please be patient.  In the meantime, continue recommendations in your patient instructions, including avoidance measures (if applicable), until you hear from me.  Rhinitis/dizziness Use Ryaltris  (olopatadine + mometasone nasal spray combination) 1-2 sprays per nostril twice a day.  This replaces your other nasal sprays. If this works well for you, then have pharmacy ship the medication to your home - prescription already sent in.  Nasal saline spray (i.e., Simply Saline) or nasal saline lavage (i.e., NeilMed) is recommended as needed and prior to medicated nasal sprays. If the dizziness is not better - then may need to see ENT as well.   Coughing Monitor symptoms.   Reflux/GI symptoms Continue to follow up with GI. Continue lifestyle and dietary modifications. Protonix  40mg  once day - nothing to eat or drink for 20-30 minutes afterwards.   Follow up in 6 months or sooner if needed.  Follow up with PCP and all other specialists.

## 2024-10-18 ENCOUNTER — Encounter: Payer: Self-pay | Admitting: Sports Medicine

## 2024-10-18 ENCOUNTER — Ambulatory Visit: Admitting: Sports Medicine

## 2024-10-18 ENCOUNTER — Other Ambulatory Visit: Payer: Self-pay

## 2024-10-18 VITALS — BP 130/82 | Ht 64.0 in | Wt 138.0 lb

## 2024-10-18 DIAGNOSIS — S20211A Contusion of right front wall of thorax, initial encounter: Secondary | ICD-10-CM

## 2024-10-18 DIAGNOSIS — S2231XA Fracture of one rib, right side, initial encounter for closed fracture: Secondary | ICD-10-CM | POA: Diagnosis not present

## 2024-10-18 DIAGNOSIS — R0789 Other chest pain: Secondary | ICD-10-CM | POA: Diagnosis not present

## 2024-10-18 NOTE — Progress Notes (Signed)
 Discussed the use of AI scribe software for clinical note transcription with the patient, who gave verbal consent to proceed.  History of Present Illness Leslie Reed is a 78 year old female who presents with chest pain following a fall.  Chest wall pain following trauma - Chest pain began after a fall on Sunday night, with direct impact to the chest against the corner of an ottoman. - Pain is localized to the area of impact and worsens with deep inspiration. - Pain is most severe in the morning and improves with movement throughout the day. - Concern for possible rib fracture or soft tissue injury. - Recent increased upper extremity activity due to cleaning and painting two apartments, involving significant arm work. She cannot do this now secondary to pain Note the patient is on an anticoagulant medication  Thumb laceration sequelae - Sustained a laceration to the dorsal aspect of the thumb approximately six weeks ago, requiring five stitches. - Following suture removal, developed persistent numbness and dryness at the site. - Swelling and pain have decreased, but numbness remains. She is using some Vaseline to keep the skin moist and  Physical Exam  Pleasant female in no acute distress. She cannot take a deep breath without wincing in some pain BP 130/82   Ht 5' 4 (1.626 m)   Wt 138 lb (62.6 kg)   LMP 11/29/1986   BMI 23.69 kg/m   CHEST: Lungs clear to auscultation.  There is a normal respiratory rate  tenderness over the fifth rib near the bony cartilage interface at the middle of the right breast. CARDIOVASCULAR: Heart regular rate and rhythm. BREAST: Breast without bruising. No lumps in the breast. She has a point of maximal tenderness on the fifth rib that is exquisitely painful to palpation or deep breath Right thumb shows a well-healed laceration There is no sensation on the medial distal side There is significant dry skin over the thumb  Ultrasound of the  chest The ultrasound was directed over an area of maximal tenderness which was along the fifth rib Scanning near the sternum did not show any hypoechoic change There is no step-off noted in the bone At the point of maximal tenderness there is a 2 x 2 cm area of hypoechoic change that looks like a hematoma Just below this there is a slight irregularity in the bone There is all so increased Doppler flow to this area Note lung movement was normal throughout the ultrasound  Impression: Ultrasound this appears to be a small nondisplaced fracture of the fifth rib with a resulting hematoma extending into the soft tissue  Ultrasound and interpretation by Helene B. Gustav Knueppel, MD  Additional moderate issue: I suggested she keep carefully hydrating that area of the thumb because I think she cut through the digital nerve and the skin breakdown will persistently she keeps it hydrated by using Vaseline regularly and keeping the skin from drying out

## 2024-10-18 NOTE — Assessment & Plan Note (Signed)
 We showed her the hematoma and suggested more regular icing over the area of maximal tenderness She will try to use a large Ace wrap to stabilize her chest somewhat and take away some of the pain that she gets with movement She understands this will take 3 to 6 weeks to heal and it is unlikely that she should be trying to do a lot of upper extremity work while this is healing

## 2024-10-18 NOTE — Assessment & Plan Note (Signed)
 Patient again mentioned this pain today and says has been present for years It sounds like a radicular pattern possibly extending from the upper thoracic vertebral areas If this worsens we will follow-up with thoracic imaging

## 2024-10-23 NOTE — Addendum Note (Signed)
 Encounter addended by: Franchot Glade RAMAN, RN on: 10/23/2024 3:25 PM  Actions taken: Imaging Exam ended

## 2024-10-24 ENCOUNTER — Ambulatory Visit: Payer: Self-pay | Admitting: Allergy

## 2024-10-24 ENCOUNTER — Ambulatory Visit (HOSPITAL_COMMUNITY): Payer: Self-pay | Admitting: Internal Medicine

## 2024-10-24 LAB — STREP PNEUMONIAE 23 SEROTYPES IGG
Pneumo Ab Type 1*: 2 ug/mL (ref >1.3)
Pneumo Ab Type 12 (12F)*: 1.6 ug/mL (ref >1.3)
Pneumo Ab Type 14*: 7.3 ug/mL (ref >1.3)
Pneumo Ab Type 17 (17F)*: 1.7 ug/mL (ref >1.3)
Pneumo Ab Type 19 (19F)*: 4.8 ug/mL (ref >1.3)
Pneumo Ab Type 2*: 3 ug/mL (ref >1.3)
Pneumo Ab Type 20*: 2.2 ug/mL (ref >1.3)
Pneumo Ab Type 22 (22F)*: 1 ug/mL — ABNORMAL LOW (ref >1.3)
Pneumo Ab Type 23 (23F)*: 8.5 ug/mL (ref >1.3)
Pneumo Ab Type 26 (6B)*: 6 ug/mL (ref >1.3)
Pneumo Ab Type 3*: 0.5 ug/mL — ABNORMAL LOW (ref >1.3)
Pneumo Ab Type 34 (10A)*: 0.6 ug/mL — ABNORMAL LOW (ref >1.3)
Pneumo Ab Type 4*: 0.4 ug/mL — ABNORMAL LOW (ref >1.3)
Pneumo Ab Type 43 (11A)*: 3.6 ug/mL (ref >1.3)
Pneumo Ab Type 5*: 3.1 ug/mL (ref >1.3)
Pneumo Ab Type 51 (7F)*: 4 ug/mL (ref >1.3)
Pneumo Ab Type 54 (15B)*: 11.5 ug/mL (ref >1.3)
Pneumo Ab Type 56 (18C)*: 1.5 ug/mL (ref >1.3)
Pneumo Ab Type 57 (19A)*: 2.7 ug/mL (ref >1.3)
Pneumo Ab Type 68 (9V)*: 1.3 ug/mL — ABNORMAL LOW (ref >1.3)
Pneumo Ab Type 70 (33F)*: 3.8 ug/mL (ref >1.3)
Pneumo Ab Type 8*: 4.8 ug/mL (ref >1.3)
Pneumo Ab Type 9 (9N)*: 1.2 ug/mL — ABNORMAL LOW (ref >1.3)

## 2024-10-24 LAB — IGG, IGA, IGM
IgG (Immunoglobin G), Serum: 631 mg/dL (ref 586–1602)
IgM (Immunoglobulin M), Srm: 27 mg/dL (ref 26–217)
Immunoglobulin A, (IgA) QN, Serum: 67 mg/dL (ref 64–422)

## 2024-10-26 ENCOUNTER — Encounter (HOSPITAL_COMMUNITY): Payer: Self-pay

## 2024-10-26 ENCOUNTER — Ambulatory Visit (HOSPITAL_COMMUNITY): Admission: RE | Admit: 2024-10-26 | Source: Ambulatory Visit

## 2024-10-29 ENCOUNTER — Ambulatory Visit (HOSPITAL_COMMUNITY)
Admission: RE | Admit: 2024-10-29 | Discharge: 2024-10-29 | Disposition: A | Source: Ambulatory Visit | Attending: Internal Medicine | Admitting: Internal Medicine

## 2024-10-29 ENCOUNTER — Ambulatory Visit (HOSPITAL_COMMUNITY): Payer: Self-pay | Admitting: Internal Medicine

## 2024-10-29 DIAGNOSIS — I351 Nonrheumatic aortic (valve) insufficiency: Secondary | ICD-10-CM | POA: Diagnosis present

## 2024-10-29 LAB — ECHOCARDIOGRAM COMPLETE
Area-P 1/2: 3.23 cm2
MV M vel: 4.94 m/s
MV Peak grad: 97.6 mmHg
P 1/2 time: 576 ms
S' Lateral: 2.9 cm

## 2024-11-06 ENCOUNTER — Ambulatory Visit (INDEPENDENT_AMBULATORY_CARE_PROVIDER_SITE_OTHER): Admitting: Gastroenterology

## 2024-11-06 ENCOUNTER — Encounter: Payer: Self-pay | Admitting: Gastroenterology

## 2024-11-06 VITALS — BP 144/70 | HR 64 | Ht 64.0 in | Wt 143.2 lb

## 2024-11-06 DIAGNOSIS — K589 Irritable bowel syndrome without diarrhea: Secondary | ICD-10-CM

## 2024-11-06 DIAGNOSIS — K2210 Ulcer of esophagus without bleeding: Secondary | ICD-10-CM | POA: Insufficient documentation

## 2024-11-06 DIAGNOSIS — K219 Gastro-esophageal reflux disease without esophagitis: Secondary | ICD-10-CM | POA: Insufficient documentation

## 2024-11-06 DIAGNOSIS — K2270 Barrett's esophagus without dysplasia: Secondary | ICD-10-CM | POA: Insufficient documentation

## 2024-11-06 DIAGNOSIS — R109 Unspecified abdominal pain: Secondary | ICD-10-CM | POA: Insufficient documentation

## 2024-11-06 DIAGNOSIS — Z7901 Long term (current) use of anticoagulants: Secondary | ICD-10-CM | POA: Insufficient documentation

## 2024-11-06 MED ORDER — HYOSCYAMINE SULFATE 0.125 MG SL SUBL: 0.1250 mg | SUBLINGUAL_TABLET | Freq: Four times a day (QID) | SUBLINGUAL | 1 refills | Status: AC | PRN

## 2024-11-06 NOTE — Progress Notes (Signed)
 11/06/2024 Leslie Reed [id number] [birth year]   Discussed the use of AI scribe software for clinical note transcription with the patient, who gave verbal consent to proceed.  History of Present Illness Leslie Reed is a 78 year old female with Barrett's esophagus and atrial fibrillation on Eliquis  who presents for follow-up after endoscopy.  She is a patient of Dr. Trenna.  She underwent an endoscopy in October, which was delayed from June due to a diagnosis of atrial fibrillation that required hospitalization. During the endoscopy, Barrett's esophagus and esophageal ulcers were identified, and a biopsy was performed. She needs to have a repeat endoscopy at the end of January or beginning of February, 3 month follow up to confirm healing. She is currently taking pantoprazole  twice daily.  She has a history of irritable bowel syndrome with chronic soft stools. Recently, she experienced a period of bright yellow diarrhea, described as 'pouring the stool into the specimen cup and it's bright yellow.' This episode lasted a couple of weeks but has since improved, with stools becoming more solid and back to her baseline. She has no gallbladder, which was removed years ago.  She experiences sharp pains across the lower part of her abdomen and back, more noticeable in the morning, which are sporadic and sometimes absent. She has a history of multiple cesarean sections, but she considers this pain a new issue.  She has been on Eliquis  since her atrial fibrillation diagnosis and has had no symptoms of atrial fibrillation. She was on a heart monitor and feels well, with no symptoms of atrial fibrillation.   EGD 08/2024:  - LA Grade C erosive esophagitis with no bleeding. - Esophageal ulcers with no bleeding and no stigmata of recent bleeding. Biopsied. - 3 cm hiatal hernia.- Gastritis. Biopsied. - Normal examined duodenum.  FINAL DIAGNOSIS        1. Surgical [P], gastric antrum and  gastric body :       GASTRIC ANTRAL / OXYNTIC MUCOSA WITH CHRONIC INACTIVE GASTRITIS.       IMMUNOHISTOCHEMICAL STAIN FOR H. PYLORI WILL BE REPORTED IN AN ADDENDUM.       NEGATIVE FOR INTESTINAL METAPLASIA OR DYSPLASIA.        2. Surgical [P], GE junction :       SQUAMOCOLUMNAR MUCOSA WITH INTESTINAL METAPLASIA.       NEGATIVE FOR DYSPLASIA.       SEE COMMENT.  Colonoscopy 11/2021: - Severe diverticulosis in the sigmoid colon, in the descending colon, in the transverse colon and in the ascending colon. - Non- bleeding external and internal hemorrhoids. - The examination was otherwise normal. - No specimens collected.    Past Medical History:  Diagnosis Date   Allergic rhinitis    Allergy    Arthritis    hands, lower back   Asthma    Atrial fib/flutter, transient (HCC)    Barrett's esophagus    Bronchitis    uses inhaler prn   Cataract    just being monitored - no surgery   Colitis 06/2003   Diverticulitis    GERD (gastroesophageal reflux disease)    diet controlled, no meds   History of migraine headaches    Hx of colonic polyp    Hx of varicella    Hyperlipidemia    Insomnia    Interstitial cystitis 08/1989   Mitral valve prolapse    Osteopenia    Scoliosis 2005   Sleep apnea    Tick bite 2014  STARI-antibiotics x 5 weeks   Tracheobronchopathia-osteochondroplastica    Past Surgical History:  Procedure Laterality Date   ABDOMINAL HYSTERECTOMY     partial  for pain no cancer     ADENOIDECTOMY     APPENDECTOMY     BREAST BIOPSY Right    x 2    BRONCHIAL WASHINGS  04/22/2020   Procedure: BRONCHIAL lavage;  Surgeon: Gretta Leita SQUIBB, DO;  Location: [facility];  Service: Endoscopy;;   CESAREAN SECTION     x4   CHOLECYSTECTOMY     colon polyps     COLONOSCOPY  09/09/2016   Dr.Nandigam   DILATION AND CURETTAGE OF UTERUS     x 2   LAPAROSCOPY  05/01/2024   Procedure: LAPAROSCOPY, DIAGNOSTIC;  Surgeon: Rubin Calamity, MD;  Location: [facility] OR;  Service: General;;    TONSILLECTOMY     VIDEO BRONCHOSCOPY N/A 04/22/2020   Procedure: VIDEO BRONCHOSCOPY WITHOUT FLUORO;  Surgeon: Gretta Leita SQUIBB, DO;  Location: [facility];  Service: Endoscopy;  Laterality: N/A;   WISDOM TOOTH EXTRACTION      reports that she has never smoked. She has been exposed to tobacco smoke. She has never used smokeless tobacco. She reports that she does not currently use alcohol. She reports that she does not use drugs. family history includes Allergy (severe) in her son; Alzheimer's disease in her father; Colon cancer in her maternal grandfather; Colon polyps in her daughter; Depression in her son; Diabetes in her maternal grandmother, mother, sister, and son; Hypertension in her brother, father, and mother; Kidney failure in her father; Psoriasis in her daughter; Pulmonary fibrosis in her mother; Sleep apnea in her brother; Thyroid  disease in her daughter and daughter. No Known Allergies    Outpatient Encounter Medications as of 11/06/2024  Medication Sig   apixaban  (ELIQUIS ) 5 MG TABS tablet Take 1 tablet (5 mg total) by mouth 2 (two) times daily.   calcium  carbonate (TUMS - DOSED IN MG ELEMENTAL CALCIUM ) 500 MG chewable tablet Chew 2 tablets by mouth as needed for indigestion or heartburn.   carboxymethylcellulose (REFRESH PLUS) 0.5 % SOLN Place 1 drop into both eyes as needed.   Chlorphen-Pseudoephed-APAP (TYLENOL  ALLERGY SINUS PO) Take 1-2 tablets by mouth as needed.   Cholecalciferol (VITAMIN D3) 50 MCG (2000 UT) capsule Take 2 capsules (4,000 Units total) by mouth daily.   cyanocobalamin  (VITAMIN B12) 1000 MCG tablet Take 1,000 mcg by mouth daily.   Dihydroxyaluminum Sod Carb (ROLAIDS PO) Take 2 tablets by mouth as needed.   melatonin 3 MG TABS tablet Take 3 mg by mouth at bedtime.   metoprolol  succinate (TOPROL  XL) 25 MG 24 hr tablet Take 1 tablet (25 mg total) by mouth at bedtime. Take with or immediately following a meal.   Minoxidil (ROGAINE EX) Apply 1 Application topically  as needed.   Olopatadine-Mometasone (RYALTRIS ) 665-25 MCG/ACT SUSP Place 1-2 sprays into the nose in the morning and at bedtime.   pantoprazole  (PROTONIX ) 40 MG tablet Take 1 tablet (40 mg total) by mouth 2 (two) times daily.   Respiratory Therapy Supplies (FLUTTER) DEVI 1 each by Does not apply route 2 (two) times daily.   UNABLE TO FIND Apply 1 Application topically as needed. Mentholatum Ointment   No facility-administered encounter medications on file as of 11/06/2024.    REVIEW OF SYSTEMS  : All other systems reviewed and negative except where noted in the History of Present Illness.   PHYSICAL EXAM: BP (!) 144/70   Pulse 64  Ht 5' 4 (1.626 m)   Wt 143 lb 3.2 oz (65 kg)   LMP 11/29/1986   BMI 24.58 kg/m  General: Well developed white female in no acute distress Head: Normocephalic and atraumatic Eyes:  Sclerae anicteric, conjunctiva pink. Ears: Normal auditory acuity Lungs: Clear throughout to auscultation; no W/R/R. Heart: Regular rate and rhythm; no M/R/G. Abdomen: Soft, non-distended.  BS present.  Mild lower abdominal TTP. Musculoskeletal: Symmetrical with no gross deformities  Skin: No lesions on visible extremities Extremities: No edema  Neurological: Alert oriented x 4, grossly non-focal Psychological:  Alert and cooperative. Normal mood and affect  Assessment & Plan Barrett's esophagus/esophageal ulcers/GERD Confirmed by biopsy with esophageal irritation. No classic reflux symptoms reported. - Schedule repeat endoscopy for end of January or beginning of February for 3 month follow-up. - Continue pantoprazole  twice daily.  Irritable bowel syndrome with lower abdominal cramping Recent episode of diarrhea with bright yellow stools, possibly related to postcholecystectomy bile salt diarrhea. Symptoms improving. Differential also includes infectious gastroenteritis or IBS flare. - Prescribed hyoscyamine  (Levsin ) for cramping as needed.  Diarrhea Recent bright  yellow stools possibly due to bile salt diarrhea post-cholecystectomy. Symptoms improving.  Differential also includes infectious gastroenteritis or IBS flare.  Soft/loose stools not unusual with her history of IBS, but this was different.  Cdiff negative. - Will consider Questran (cholestyramine) if symptoms persist.   Chronic anticoagulation due to atrial fibrillation, on Eliquis  Will hold Eliquis  2 days prior to endoscopic procedures - will instruct when and how to resume after procedure. Benefits and risks of procedure explained including risks of bleeding, perforation, infection, missed lesions, reactions to medications and possible need for hospitalization and surgery for complications. Additional rare but real risk of stroke or other vascular clotting events off of Eliquis  also explained and need to seek urgent help if any signs of these problems occur. Will communicate by phone or EMR with patient's prescribing provider to confirm that holding Eliquis  is reasonable in this case.     CC:  Panosh, Apolinar POUR, MD

## 2024-11-06 NOTE — Patient Instructions (Signed)
 We have sent the following medications to your pharmacy for you to pick up at your convenience: Levsin  0.125 every 6-8 hours as needed for abdominal cramping.   You have been scheduled for an endoscopy. Please follow written instructions given to you at your visit today.  If you use inhalers (even only as needed), please bring them with you on the day of your procedure.  If you take any of the following medications, they will need to be adjusted prior to your procedure:   DO NOT TAKE 7 DAYS PRIOR TO TEST- Trulicity (dulaglutide) Ozempic, Wegovy (semaglutide) Mounjaro, Zepbound (tirzepatide) Bydureon Bcise (exanatide extended release)  DO NOT TAKE 1 DAY PRIOR TO YOUR TEST Rybelsus (semaglutide) Adlyxin (lixisenatide) Victoza (liraglutide) Byetta (exanatide) _____________________________________________________________________

## 2024-11-13 ENCOUNTER — Telehealth: Payer: Medicare Other | Admitting: Adult Health

## 2024-11-13 ENCOUNTER — Ambulatory Visit: Payer: Medicare Other | Admitting: Adult Health

## 2024-11-19 ENCOUNTER — Ambulatory Visit: Admitting: Gastroenterology

## 2024-12-05 ENCOUNTER — Telehealth: Payer: Self-pay | Admitting: *Deleted

## 2024-12-05 NOTE — Telephone Encounter (Signed)
 [facility] Pre-operative Risk Assessment     Request for surgical clearance:     Endoscopy Procedure  What type of surgery is being performed?     EGD  When is this surgery scheduled?     12/25/24  What type of clearance is required ?   Pharmacy  Are there any medications that need to be held prior to surgery and how long? Eliquis  2 days  Practice name and name of physician performing surgery?      [facility]  What is your office phone and fax number?      Phone- [phone]  Fax- [phone]  Anesthesia type (None, local, MAC, general) ?       MAC   Please route your response to Powell Misty, CMA

## 2024-12-06 NOTE — Telephone Encounter (Signed)
 Patient with diagnosis of afib on Eliquis  for anticoagulation.    Procedure: EGD  Date of procedure: 12/25/24   CHA2DS2-VASc Score = 4   This indicates a 4.8% annual risk of stroke. The patient's score is based upon: CHF History: 0 HTN History: 1 Diabetes History: 0 Stroke History: 0 Vascular Disease History: 0 Age Score: 2 Gender Score: 1      CrCl 63 ml/min Platelet count 329  Patient has not had an Afib/aflutter ablation in the last 3 months, DCCV within the last 4 weeks or a watchman implanted in the last 45 days   Per office protocol, patient can hold Eliquis  for 2 days prior to procedure.    **This guidance is not considered finalized until pre-operative APP has relayed final recommendations.**

## 2024-12-06 NOTE — Telephone Encounter (Signed)
"  ° °  Patient Name: Leslie Reed  DOB: 1946/01/10 MRN: [id number]  Primary Cardiologist: None  Clinical pharmacists have reviewed the patient's past medical history, labs, and current medications as part of preoperative protocol coverage. The following recommendations have been made:  Patient with diagnosis of afib on Eliquis  for anticoagulation.     Procedure: EGD  Date of procedure: 12/25/24     CHA2DS2-VASc Score = 4   This indicates a 4.8% annual risk of stroke. The patient's score is based upon: CHF History: 0 HTN History: 1 Diabetes History: 0 Stroke History: 0 Vascular Disease History: 0 Age Score: 2 Gender Score: 1       CrCl 63 ml/min Platelet count 329   Patient has not had an Afib/aflutter ablation in the last 3 months, DCCV within the last 4 weeks or a watchman implanted in the last 45 days    Per office protocol, patient can hold Eliquis  for 2 days prior to procedure.  Please resume Eliquis  as soon as possible postprocedure, at the discretion of the surgeon.    I will route this recommendation to the requesting party via Epic fax function and remove from pre-op pool.  Please call with questions.  Damien JAYSON Braver, NP 12/06/2024, 12:38 PM   "

## 2024-12-07 NOTE — Telephone Encounter (Signed)
 Left message for patient to call office.

## 2024-12-13 ENCOUNTER — Encounter (HOSPITAL_COMMUNITY): Payer: Self-pay | Admitting: Internal Medicine

## 2024-12-13 ENCOUNTER — Ambulatory Visit (HOSPITAL_COMMUNITY)
Admission: RE | Admit: 2024-12-13 | Discharge: 2024-12-13 | Disposition: A | Discharge status: Home / Self Care | Source: Ambulatory Visit | Attending: Internal Medicine | Admitting: Internal Medicine

## 2024-12-13 VITALS — BP 142/78 | HR 68 | Ht 64.0 in | Wt 148.0 lb

## 2024-12-13 DIAGNOSIS — D6869 Other thrombophilia: Secondary | ICD-10-CM

## 2024-12-13 DIAGNOSIS — I480 Paroxysmal atrial fibrillation: Secondary | ICD-10-CM | POA: Diagnosis not present

## 2024-12-13 MED ORDER — METOPROLOL SUCCINATE ER 25 MG PO TB24: 25.0000 mg | ORAL_TABLET | Freq: Every day | ORAL | 3 refills | Status: DC

## 2024-12-13 MED ORDER — APIXABAN 5 MG PO TABS: 5.0000 mg | ORAL_TABLET | Freq: Two times a day (BID) | ORAL | 3 refills | Status: AC

## 2024-12-13 NOTE — Progress Notes (Signed)
 "   Primary Care Physician: Charlett Apolinar POUR, MD Primary Cardiologist: None Electrophysiologist: None     Referring Physician: ED     Leslie Reed is a 79 y.o. female with a history of HTN, HLD, OSA on CPAP, and atrial fibrillation who presents for consultation in the [facility]. ED visit on 06/27/24 for new onset Afib with RVR found at GI office appointment. Started on Toprol  50 mg daily. Patient is on Eliquis  5 mg BID for a CHADS2VASC score of 4.  On evaluation today, patient is currently in NSR. Patient notes to have been dehydrated the day before episode. She admits to not drinking lots of water. She wears CPAP every night for at least 4 hours. She did not have cardiac awareness. No alcohol.   Follow-up 12/13/2024.  Patient is currently in NSR.  Monitor worn in [place] showed 1% A-fib burden.  She is taking Toprol  25 mg daily; dose was previously lowered due to patient noting fatigue.  Patient has a significant amount of stress related to son being on transplant list and she is managing multiple properties with several responsibilities. No bleeding issues on Eliquis .  Today, she denies symptoms of palpitations, chest pain, shortness of breath, orthopnea, PND, lower extremity edema, dizziness, presyncope, syncope, snoring, daytime somnolence, bleeding, or neurologic sequela. The patient is tolerating medications without difficulties and is otherwise without complaint today.    Atrial Fibrillation Risk Factors:  she does have symptoms or diagnosis of sleep apnea. she is compliant with CPAP therapy.   she has a BMI of Body mass index is 25.4 kg/m.SABRA Filed Weights   12/13/24 1431  Weight: 67.1 kg     Current Outpatient Medications  Medication Sig Dispense Refill   calcium  carbonate (TUMS - DOSED IN MG ELEMENTAL CALCIUM ) 500 MG chewable tablet Chew 2 tablets by mouth as needed for indigestion or heartburn.     carboxymethylcellulose (REFRESH  PLUS) 0.5 % SOLN Place 1 drop into both eyes as needed.     Chlorphen-Pseudoephed-APAP (TYLENOL  ALLERGY SINUS PO) Take 1-2 tablets by mouth as needed.     Cholecalciferol (VITAMIN D3) 50 MCG (2000 UT) capsule Take 2 capsules (4,000 Units total) by mouth daily. 60 capsule 3   cyanocobalamin  (VITAMIN B12) 1000 MCG tablet Take 1,000 mcg by mouth daily.     Dihydroxyaluminum Sod Carb (ROLAIDS PO) Take 2 tablets by mouth as needed.     hyoscyamine  (LEVSIN  SL) 0.125 MG SL tablet Place 1 tablet (0.125 mg total) under the tongue every 6 (six) hours as needed. 30 tablet 1   melatonin 3 MG TABS tablet Take 3 mg by mouth at bedtime.     Minoxidil (ROGAINE EX) Apply 1 Application topically as needed.     Olopatadine-Mometasone (RYALTRIS ) 665-25 MCG/ACT SUSP Place 1-2 sprays into the nose in the morning and at bedtime. 29 g 5   pantoprazole  (PROTONIX ) 40 MG tablet Take 1 tablet (40 mg total) by mouth 2 (two) times daily. 180 tablet 3   Respiratory Therapy Supplies (FLUTTER) DEVI 1 each by Does not apply route 2 (two) times daily. 1 each 0   UNABLE TO FIND Apply 1 Application topically as needed. Mentholatum Ointment     apixaban  (ELIQUIS ) 5 MG TABS tablet Take 1 tablet (5 mg total) by mouth 2 (two) times daily. 180 tablet 3   metoprolol  succinate (TOPROL  XL) 25 MG 24 hr tablet Take 1 tablet (25 mg total) by mouth at bedtime. Take with or immediately  following a meal. 90 tablet 3   No current facility-administered medications for this encounter.    Atrial Fibrillation Management history:  Previous antiarrhythmic drugs: none Previous cardioversions: none Previous ablations: none Anticoagulation history: Eliquis    ROS- All systems are reviewed and negative except as per the HPI above.  Physical Exam: BP (!) 142/78   Pulse 68   Ht 5' 4 (1.626 m)   Wt 67.1 kg   LMP 11/29/1986   BMI 25.40 kg/m   GEN- The patient is well appearing, alert and oriented x 3 today.   Neck - no JVD or carotid bruit  noted Lungs- Clear to ausculation bilaterally, normal work of breathing Heart- Regular rate and rhythm, no murmurs, rubs or gallops, PMI not laterally displaced Extremities- no clubbing, cyanosis, or edema Skin - no rash or ecchymosis noted   EKG today demonstrates  EKG Interpretation Date/Time:  Thursday December 13 2024 14:33:56 EST Ventricular Rate:  68 PR Interval:  164 QRS Duration:  80 QT Interval:  442 QTC Calculation: 469 R Axis:   65  Text Interpretation: Normal sinus rhythm Nonspecific ST abnormality Abnormal ECG When compared with ECG of 02-Jul-2024 13:58, No significant change was found Confirmed by Terra Pac [phone]) on 12/13/2024 2:36:06 PM    Echo 10/29/24:  1. Left ventricular ejection fraction, by estimation, is 55 to 60%. Left  ventricular ejection fraction by 3D volume is 57 %. The left ventricle has  normal function. The left ventricle has no regional wall motion  abnormalities. Left ventricular diastolic   parameters are consistent with Grade I diastolic dysfunction (impaired  relaxation). The average left ventricular global longitudinal strain is  -22.1 %. The global longitudinal strain is normal.   2. Right ventricular systolic function is normal. The right ventricular  size is normal.   3. Left atrial size was mildly dilated.   4. The mitral valve is abnormal. Mild mitral valve regurgitation. No  evidence of mitral stenosis. 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   5. 2D VC 3.8 mm, PHT 576 ms; SABRA The aortic valve is tricuspid. Aortic  valve regurgitation is mild to moderate.   6. The inferior vena cava is normal in size with greater than 50%  respiratory variability, suggesting right atrial pressure of 3 mmHg.   ASSESSMENT & PLAN CHA2DS2-VASc Score = 4  The patient's score is based upon: CHF History: 0 HTN History: 1 Diabetes History: 0 Stroke History: 0 Vascular Disease History:  0 Age Score: 2 Gender Score: 1       ASSESSMENT AND PLAN: Paroxysmal Atrial Fibrillation (ICD10:  I48.0) The patient's CHA2DS2-VASc score is 4, indicating a 4.8% annual risk of stroke.    Patient is currently in NSR.  She has had overall low A-fib burden noted on monitor. We will continue with conservative observation at this time.  Continue Toprol  25 mg daily.  Secondary Hypercoagulable State (ICD10:  D68.69) The patient is at significant risk for stroke/thromboembolism based upon her CHA2DS2-VASc Score of 4.  Continue Apixaban  (Eliquis ).  Continue Eliquis  5 mg twice daily.    Follow up 6 months Afib clinic.   Terra Pac, PA-C  Afib Clinic [facility] [address] [place], [place] [address] [phone]   "

## 2024-12-14 NOTE — Telephone Encounter (Signed)
 Left message for patient to call office.

## 2024-12-17 ENCOUNTER — Other Ambulatory Visit (HOSPITAL_COMMUNITY): Payer: Self-pay | Admitting: *Deleted

## 2024-12-17 MED ORDER — METOPROLOL SUCCINATE ER 25 MG PO TB24: 25.0000 mg | ORAL_TABLET | Freq: Every day | ORAL | 3 refills | Status: AC

## 2024-12-17 NOTE — Telephone Encounter (Signed)
 Left message for patient to call office.

## 2024-12-18 ENCOUNTER — Encounter: Payer: Self-pay | Admitting: Gastroenterology

## 2024-12-21 ENCOUNTER — Telehealth: Payer: Self-pay | Admitting: Gastroenterology

## 2024-12-21 NOTE — Telephone Encounter (Signed)
 Calling to reschedule due to weather

## 2024-12-25 ENCOUNTER — Encounter: Payer: Self-pay | Admitting: Gastroenterology

## 2024-12-25 ENCOUNTER — Ambulatory Visit: Admitting: Gastroenterology

## 2024-12-25 VITALS — BP 121/68 | HR 73 | Temp 97.3°F | Resp 16 | Ht 64.0 in | Wt 143.0 lb

## 2024-12-25 DIAGNOSIS — K449 Diaphragmatic hernia without obstruction or gangrene: Secondary | ICD-10-CM | POA: Diagnosis not present

## 2024-12-25 DIAGNOSIS — K209 Esophagitis, unspecified without bleeding: Secondary | ICD-10-CM | POA: Diagnosis not present

## 2024-12-25 DIAGNOSIS — K2270 Barrett's esophagus without dysplasia: Secondary | ICD-10-CM

## 2024-12-25 DIAGNOSIS — K2210 Ulcer of esophagus without bleeding: Secondary | ICD-10-CM

## 2024-12-25 DIAGNOSIS — K219 Gastro-esophageal reflux disease without esophagitis: Secondary | ICD-10-CM

## 2024-12-25 MED ORDER — PANTOPRAZOLE SODIUM 40 MG PO TBEC: 40.0000 mg | DELAYED_RELEASE_TABLET | Freq: Every day | ORAL | 3 refills | Status: AC

## 2024-12-25 MED ORDER — SODIUM CHLORIDE 0.9 % IV SOLN: 500.0000 mL | INTRAVENOUS | Status: DC

## 2024-12-25 NOTE — Patient Instructions (Addendum)
 Please read handouts provided. Continue present medications. Resume Eliquis  ( apixaban  ) at prior dose tomorrow. Follow an antireflux regimen. Await pathology results.  YOU HAD AN ENDOSCOPIC PROCEDURE TODAY AT THE [facility]:   Refer to the procedure report that was given to you for any specific questions about what was found during the examination.  If the procedure report does not answer your questions, please call your gastroenterologist to clarify.  If you requested that your care partner not be given the details of your procedure findings, then the procedure report has been included in a sealed envelope for you to review at your convenience later.  Please Note:  You might notice some irritation and congestion in your nose or some drainage.  This is from the oxygen used during your procedure.  There is no need for concern and it should clear up in a day or   For urgent or emergent issues, a gastroenterologist can be reached at any hour by calling [phone]. Do not use MyChart messaging for urgent concerns.    DIET:  We do recommend a small meal at first, but then you may proceed to your regular diet.  Drink plenty of fluids but you should avoid alcoholic beverages for 24 hours.  ACTIVITY:  You should plan to take it easy for the rest of today and you should NOT DRIVE or use heavy machinery until tomorrow (because of the sedation medicines used during the test).    FOLLOW UP: Our staff will call the number listed on your records the next business day following your procedure.  We will call around 7:15- 8:00 am to check on you and address any questions or concerns that you may have regarding the information given to you following your procedure. If we do not reach you, we will leave a message.     If any biopsies were taken you will be contacted by phone or by letter within the next 1-3 weeks.  Please call us  at [phone] if you have not heard about the biopsies in 3  weeks.    SIGNATURES/CONFIDENTIALITY: You and/or your care partner have signed paperwork which will be entered into your electronic medical record.  These signatures attest to the fact that that the information above on your After Visit Summary has been reviewed and is understood.  Full responsibility of the confidentiality of this discharge information lies with you and/or your care-partner.YOU HAD AN ENDOSCOPIC PROCEDURE TODAY AT THE [facility]:   Refer to the procedure report that was given to you for any specific questions about what was found during the examination.  If the procedure report does not answer your questions, please call your gastroenterologist to clarify.  If you requested that your care partner not be given the details of your procedure findings, then the procedure report has been included in a sealed envelope for you to review at your convenience later.  YOU SHOULD EXPECT: Some feelings of bloating in the abdomen. Passage of more gas than usual.  Walking can help get rid of the air that was put into your GI tract during the procedure and reduce the bloating. If you had a lower endoscopy (such as a colonoscopy or flexible sigmoidoscopy) you may notice spotting of blood in your stool or on the toilet paper. If you underwent a bowel prep for your procedure, you may not have a normal bowel movement for a few days.  Please Note:  You might notice some irritation and  congestion in your nose or some drainage.  This is from the oxygen used during your procedure.  There is no need for concern and it should clear up in a day or so.  SYMPTOMS TO REPORT IMMEDIATELY:  Following upper endoscopy (EGD)  Vomiting of blood or coffee ground material  New chest pain or pain under the shoulder blades  Painful or persistently difficult swallowing  New shortness of breath  Fever of 100F or higher  Black, tarry-looking stools  For urgent or emergent issues, a gastroenterologist can be  reached at any hour by calling [phone]. Do not use MyChart messaging for urgent concerns.    DIET:  We do recommend a small meal at first, but then you may proceed to your regular diet.  Drink plenty of fluids but you should avoid alcoholic beverages for 24 hours.  ACTIVITY:  You should plan to take it easy for the rest of today and you should NOT DRIVE or use heavy machinery until tomorrow (because of the sedation medicines used during the test).    FOLLOW UP: Our staff will call the number listed on your records the next business day following your procedure.  We will call around 7:15- 8:00 am to check on you and address any questions or concerns that you may have regarding the information given to you following your procedure. If we do not reach you, we will leave a message.     If any biopsies were taken you will be contacted by phone or by letter within the next 1-3 weeks.  Please call us  at [phone] if you have not heard about the biopsies in 3 weeks.    SIGNATURES/CONFIDENTIALITY: You and/or your care partner have signed paperwork which will be entered into your electronic medical record.  These signatures attest to the fact that that the information above on your After Visit Summary has been reviewed and is understood.  Full responsibility of the confidentiality of this discharge information lies with you and/or your care-partner.

## 2024-12-25 NOTE — Progress Notes (Signed)
 [facility] History and Physical   Primary Care Physician:  Charlett Apolinar POUR, MD   Reason for Procedure:  Erosive esophagitis, esophageal ulcers, barrett's esophagus  Plan:    EGD with possible interventions as needed     HPI: Leslie Reed is a very pleasant 79 y.o. female here for EGD for follow up of erosive esophagitis, esophageal ulcers, barrett's esophagus Denies any nausea, vomiting, abdominal pain, melena or bright red blood per rectum  The risks and benefits as well as alternatives of endoscopic procedure(s) have been discussed and reviewed.  The patient was provided an opportunity to ask questions and all were answered. The patient agreed with the plan and demonstrated an understanding of the instructions.   Past Medical History:  Diagnosis Date   Allergic rhinitis    Allergy    Anxiety    Arthritis    hands, lower back   Asthma    Atrial fib/flutter, transient (HCC)    Barrett's esophagus    Bronchitis    uses inhaler prn   Cataract    just being monitored - no surgery   Colitis 06/2003   Diverticulitis    GERD (gastroesophageal reflux disease)    diet controlled, no meds   History of migraine headaches    Hx of colonic polyp    Hx of varicella    Hyperlipidemia    Insomnia    Interstitial cystitis 08/1989   Mitral valve prolapse    Osteopenia    Scoliosis 2005   Sleep apnea    Tick bite 2014   STARI-antibiotics x 5 weeks   Tracheobronchopathia-osteochondroplastica     Past Surgical History:  Procedure Laterality Date   ABDOMINAL HYSTERECTOMY     partial  for pain no cancer     ADENOIDECTOMY     APPENDECTOMY     BREAST BIOPSY Right    x 2    BRONCHIAL WASHINGS  04/22/2020   Procedure: BRONCHIAL lavage;  Surgeon: Gretta Leita SQUIBB, DO;  Location: [facility];  Service: Endoscopy;;   CESAREAN SECTION     x4   CHOLECYSTECTOMY     colon polyps     COLONOSCOPY  09/09/2016   Dr.Lanasia Porras   DILATION AND CURETTAGE OF UTERUS     x 2    LAPAROSCOPY  05/01/2024   Procedure: LAPAROSCOPY, DIAGNOSTIC;  Surgeon: Rubin Calamity, MD;  Location: [facility] OR;  Service: General;;   TONSILLECTOMY     VIDEO BRONCHOSCOPY N/A 04/22/2020   Procedure: VIDEO BRONCHOSCOPY WITHOUT FLUORO;  Surgeon: Gretta Leita SQUIBB, DO;  Location: [facility];  Service: Endoscopy;  Laterality: N/A;   WISDOM TOOTH EXTRACTION      Prior to Admission medications  Medication Sig Start Date End Date Taking? Authorizing Provider  Cholecalciferol (VITAMIN D3) 50 MCG (2000 UT) capsule Take 2 capsules (4,000 Units total) by mouth daily. 02/07/24  Yes Panosh, Apolinar POUR, MD  cyanocobalamin  (VITAMIN B12) 1000 MCG tablet Take 1,000 mcg by mouth daily.   Yes [provider]  Dihydroxyaluminum Sod Carb (ROLAIDS PO) Take 2 tablets by mouth as needed.   Yes [provider]  apixaban  (ELIQUIS ) 5 MG TABS tablet Take 1 tablet (5 mg total) by mouth 2 (two) times daily. 12/13/24   Terra Fairy PARAS, PA-C  calcium  carbonate (TUMS - DOSED IN MG ELEMENTAL CALCIUM ) 500 MG chewable tablet Chew 2 tablets by mouth as needed for indigestion or heartburn.    [provider]  carboxymethylcellulose (REFRESH PLUS) 0.5 % SOLN Place 1  drop into both eyes as needed. Patient not taking: Reported on 12/25/2024    [provider]  Chlorphen-Pseudoephed-APAP (TYLENOL  ALLERGY SINUS PO) Take 1-2 tablets by mouth as needed. Patient not taking: Reported on 12/25/2024    [provider]  hyoscyamine  (LEVSIN  SL) 0.125 MG SL tablet Place 1 tablet (0.125 mg total) under the tongue every 6 (six) hours as needed. Patient not taking: Reported on 12/25/2024 11/06/24   Zehr, Jessica D, PA-C  melatonin 3 MG TABS tablet Take 3 mg by mouth at bedtime.    [provider]  metoprolol  succinate (TOPROL  XL) 25 MG 24 hr tablet Take 1 tablet (25 mg total) by mouth at bedtime. 12/17/24   Terra Fairy PARAS, PA-C  Minoxidil (ROGAINE EX) Apply 1 Application topically as needed. Patient not  taking: Reported on 12/25/2024    [provider]  Olopatadine-Mometasone (RYALTRIS ) 665-25 MCG/ACT SUSP Place 1-2 sprays into the nose in the morning and at bedtime. Patient not taking: Reported on 12/25/2024 10/17/24   Luke Orlan HERO, DO  pantoprazole  (PROTONIX ) 40 MG tablet Take 1 tablet (40 mg total) by mouth 2 (two) times daily. 09/27/24   Faylinn Schwenn V, MD  Respiratory Therapy Supplies (FLUTTER) DEVI 1 each by Does not apply route 2 (two) times daily. 04/01/20   Gretta Doffing P, DO  UNABLE TO FIND Apply 1 Application topically as needed. Mentholatum Ointment Patient not taking: Reported on 12/25/2024    [provider]    Current Outpatient Medications  Medication Sig Dispense Refill   Cholecalciferol (VITAMIN D3) 50 MCG (2000 UT) capsule Take 2 capsules (4,000 Units total) by mouth daily. 60 capsule 3   cyanocobalamin  (VITAMIN B12) 1000 MCG tablet Take 1,000 mcg by mouth daily.     Dihydroxyaluminum Sod Carb (ROLAIDS PO) Take 2 tablets by mouth as needed.     apixaban  (ELIQUIS ) 5 MG TABS tablet Take 1 tablet (5 mg total) by mouth 2 (two) times daily. 180 tablet 3   calcium  carbonate (TUMS - DOSED IN MG ELEMENTAL CALCIUM ) 500 MG chewable tablet Chew 2 tablets by mouth as needed for indigestion or heartburn.     carboxymethylcellulose (REFRESH PLUS) 0.5 % SOLN Place 1 drop into both eyes as needed. (Patient not taking: Reported on 12/25/2024)     Chlorphen-Pseudoephed-APAP (TYLENOL  ALLERGY SINUS PO) Take 1-2 tablets by mouth as needed. (Patient not taking: Reported on 12/25/2024)     hyoscyamine  (LEVSIN  SL) 0.125 MG SL tablet Place 1 tablet (0.125 mg total) under the tongue every 6 (six) hours as needed. (Patient not taking: Reported on 12/25/2024) 30 tablet 1   melatonin 3 MG TABS tablet Take 3 mg by mouth at bedtime.     metoprolol  succinate (TOPROL  XL) 25 MG 24 hr tablet Take 1 tablet (25 mg total) by mouth at bedtime. 90 tablet 3   Minoxidil (ROGAINE EX) Apply 1 Application  topically as needed. (Patient not taking: Reported on 12/25/2024)     Olopatadine-Mometasone (RYALTRIS ) 665-25 MCG/ACT SUSP Place 1-2 sprays into the nose in the morning and at bedtime. (Patient not taking: Reported on 12/25/2024) 29 g 5   pantoprazole  (PROTONIX ) 40 MG tablet Take 1 tablet (40 mg total) by mouth 2 (two) times daily. 180 tablet 3   Respiratory Therapy Supplies (FLUTTER) DEVI 1 each by Does not apply route 2 (two) times daily. 1 each 0   UNABLE TO FIND Apply 1 Application topically as needed. Mentholatum Ointment (Patient not taking: Reported on 12/25/2024)  Current Facility-Administered Medications  Medication Dose Route Frequency Provider Last Rate Last Admin   0.9 %  sodium chloride  infusion  500 mL Intravenous Continuous Nafeesa Dils V, MD        Allergies as of 12/25/2024   (No Known Allergies)    Family History  Problem Relation Age of Onset   Diabetes Mother    Pulmonary fibrosis Mother        30 deceased   Hypertension Mother    Hypertension Father    Alzheimer's disease Father    Kidney failure Father    Diabetes Sister    Hypertension Brother    Sleep apnea Brother    Esophageal cancer Maternal Uncle    Diabetes Maternal Grandmother    Colon cancer Maternal Grandfather    Colon polyps Daughter    Thyroid  disease Daughter    Psoriasis Daughter    Thyroid  disease Daughter    Diabetes Son    Allergy (severe) Son    Depression Son    Rectal cancer Neg Hx    Stomach cancer Neg Hx    Migraines Neg Hx    Seizures Neg Hx    Stroke Neg Hx     Social History   Socioeconomic History   Marital status: Married    Spouse name: Not on file   Number of children: Not on file   Years of education: Not on file   Highest education level: Master's degree (e.g., MA, MS, MEng, MEd, MSW, MBA)  Occupational History   Not on file  Tobacco Use   Smoking status: Never    Passive exposure: Yes   Smokeless tobacco: Never   Tobacco comments:    Never smoked  12/13/24  Vaping Use   Vaping status: Never Used  Substance and Sexual Activity   Alcohol use: Not Currently    Comment: occ   Drug use: No   Sexual activity: Not Currently    Partners: Male    Birth control/protection: Surgical, Post-menopausal    Comment: TAH  Other Topics Concern   Not on file  Social History Narrative   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   hhof 2   G6 p4    Pet cats    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    [address] 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   Neg td    Sleep 4-6 interrupted    Family is WISC father with alzheimer's      1-2 cups of caffeine  coke and coffee - cutting back    Social Drivers of Health   Tobacco Use: Medium Risk (12/25/2024)   Patient History    Smoking Tobacco Use: Never    Smokeless Tobacco Use: Never    Passive Exposure: Yes  Financial Resource Strain: Low Risk (07/02/2024)   Overall Financial Resource Strain (CARDIA)    Difficulty of Paying Living Expenses: Not hard at all  Food Insecurity: No Food Insecurity (07/02/2024)   Epic    Worried About Radiation Protection Practitioner of Food in the Last Year: Never true    Ran Out of Food in the Last Year: Never true  Transportation Needs: No Transportation Needs (07/02/2024)   Epic    Lack of Transportation (Medical): No    Lack of Transportation (Non-Medical): No  Physical Activity: Sufficiently Active (07/02/2024)   Exercise Vital Sign    Days of Exercise per Week: 5 days    Minutes of Exercise per Session: 60 min  Stress: Stress Concern Present (07/02/2024)   [employer]  [employer] - Occupational Stress Questionnaire    Feeling of Stress: To some extent  Social Connections: Moderately Isolated (07/02/2024)   Social Connection and Isolation Panel    Frequency of Communication with Friends and Family: More than three times a week    Frequency of Social Gatherings with Friends and Family: Three times a week    Attends Religious Services: Never    Active Member of Clubs or Organizations: No     Attends Banker Meetings: Not on file    Marital Status: Married  Catering Manager Violence: Not At Risk (05/01/2024)   Humiliation, Afraid, Rape, and Kick questionnaire    Fear of Current or Ex-Partner: No    Emotionally Abused: No    Physically Abused: No    Sexually Abused: No  Depression (PHQ2-9): Low Risk (08/20/2024)   Depression (PHQ2-9)    PHQ-2 Score: 0  Alcohol Screen: Not on file  Housing: Low Risk (07/02/2024)   Epic    Unable to Pay for Housing in the Last Year: No    Number of Times Moved in the Last Year: 0    Homeless in the Last Year: No  Utilities: Not At Risk (05/01/2024)   [facility] Utilities    Threatened with loss of utilities: No  Health Literacy: Not on file    Review of Systems:  All other review of systems negative except as mentioned in the HPI.  Physical Exam: Vital signs in last 24 hours: BP (!) 157/85   Pulse 75   Temp (!) 97.3 F (36.3 C) (Temporal)   Ht 5' 4 (1.626 m)   Wt 143 lb (64.9 kg)   LMP 11/29/1986   SpO2 98%   BMI 24.55 kg/m  General:   Alert, NAD Lungs:  Clear .   Heart:  Regular rate and rhythm Abdomen:  Soft, nontender and nondistended. Neuro/Psych:  Alert and cooperative. Normal mood and affect. A and O x 3  Reviewed labs, radiology imaging, old records and pertinent past GI work up  Patient is appropriate for planned procedure(s) and anesthesia in an ambulatory setting   K. Veena Verdine Grenfell , MD [phone]

## 2024-12-25 NOTE — Op Note (Addendum)
 [facility] Patient Name: Leslie Reed Procedure Date: 12/25/2024 10:31 AM MRN: [id number] Endoscopist: Gustav ALONSO Mcgee , MD, [id number] Age: 79 Referring MD:  Date of Birth: 1946/07/06 Gender: Female Account #: [id number] Procedure:                Upper GI endoscopy Indications:              Follow-up of reflux esophagitis, Exclusion of                            Barrett's esophagus Medicines:                Monitored Anesthesia Care Procedure:                Pre-Anesthesia Assessment:                           - Prior to the procedure, a History and Physical                            was performed, and patient medications and                            allergies were reviewed. The patient's tolerance of                            previous anesthesia was also reviewed. The risks                            and benefits of the procedure and the sedation                            options and risks were discussed with the patient.                            All questions were answered, and informed consent                            was obtained. Prior Anticoagulants: The patient                            last took Eliquis  (apixaban ) 2 days prior to the                            procedure. ASA Grade Assessment: III - A patient                            with severe systemic disease. After reviewing the                            risks and benefits, the patient was deemed in                            satisfactory condition to undergo the procedure.  After obtaining informed consent, the endoscope was                            passed under direct vision. Throughout the                            procedure, the patient's blood pressure, pulse, and                            oxygen saturations were monitored continuously. The                            GIF [id number] #[id number] was introduced through the                            mouth, and advanced to  the second part of duodenum.                            The upper GI endoscopy was accomplished without                            difficulty. The patient tolerated the procedure                            well. Scope In: Scope Out: Findings:                 The Z-line was variable and was found 34 to 36 cm                            from the incisors. Biopsies were taken with a cold                            forceps for histology.                           The exam of the esophagus was otherwise normal.                           A 3 cm hiatal hernia was present.                           The exam of the stomach was otherwise normal.                           The examined duodenum was normal. Complications:            No immediate complications. Estimated Blood Loss:     Estimated blood loss was minimal. Impression:               - Z-line variable, 34 to 36 cm from the incisors.                            Biopsied.                           -  3 cm hiatal hernia.                           - Normal examined duodenum. Recommendation:           - Resume previous diet.                           - Continue present medications.                           - Await pathology results.                           - Follow an antireflux regimen.                           - Resume Eliquis  (apixaban ) at prior dose tomorrow.                            Refer to managing physician for further adjustment                            of therapy. Emelin Dascenzo V. Bryann Gentz, MD 12/25/2024 12:14:17 PM This report has been signed electronically.

## 2024-12-25 NOTE — Progress Notes (Signed)
 Vss nad trans to pacu

## 2024-12-25 NOTE — Progress Notes (Signed)
 Called to room to assist during endoscopic procedure.  Patient ID and intended procedure confirmed with present staff. Received instructions for my participation in the procedure from the performing physician.

## 2024-12-26 ENCOUNTER — Telehealth: Payer: Self-pay | Admitting: Lactation Services

## 2024-12-26 NOTE — Telephone Encounter (Signed)
No answer; voicemail left.  °

## 2024-12-28 LAB — SURGICAL PATHOLOGY

## 2025-03-26 ENCOUNTER — Ambulatory Visit: Admitting: Internal Medicine

## 2025-04-17 ENCOUNTER — Ambulatory Visit: Admitting: Allergy

## 2025-06-12 ENCOUNTER — Ambulatory Visit (HOSPITAL_COMMUNITY): Payer: PRIVATE HEALTH INSURANCE | Admitting: Internal Medicine

## 2025-08-16 ENCOUNTER — Ambulatory Visit (HOSPITAL_BASED_OUTPATIENT_CLINIC_OR_DEPARTMENT_OTHER): Payer: PRIVATE HEALTH INSURANCE | Admitting: Obstetrics & Gynecology

## 2025-09-30 ENCOUNTER — Ambulatory Visit: Admitting: Adult Health

## 2025-10-15 ENCOUNTER — Ambulatory Visit: Admitting: Neurology
